# Patient Record
Sex: Male | Born: 1945
Health system: Southern US, Community
[De-identification: ages and names within clinical notes are randomized; demographics above are authoritative.]

## PROBLEM LIST (undated history)

## (undated) DIAGNOSIS — J189 Pneumonia, unspecified organism: Secondary | ICD-10-CM

## (undated) DIAGNOSIS — B9689 Other specified bacterial agents as the cause of diseases classified elsewhere: Secondary | ICD-10-CM

## (undated) DIAGNOSIS — G4700 Insomnia, unspecified: Secondary | ICD-10-CM

## (undated) DIAGNOSIS — K5790 Diverticulosis of intestine, part unspecified, without perforation or abscess without bleeding: Secondary | ICD-10-CM

## (undated) DIAGNOSIS — B349 Viral infection, unspecified: Secondary | ICD-10-CM

## (undated) DIAGNOSIS — Z87891 Personal history of nicotine dependence: Secondary | ICD-10-CM

## (undated) DIAGNOSIS — C61 Malignant neoplasm of prostate: Secondary | ICD-10-CM

## (undated) DIAGNOSIS — K5792 Diverticulitis of intestine, part unspecified, without perforation or abscess without bleeding: Secondary | ICD-10-CM

## (undated) DIAGNOSIS — J019 Acute sinusitis, unspecified: Secondary | ICD-10-CM

## (undated) DIAGNOSIS — G4733 Obstructive sleep apnea (adult) (pediatric): Secondary | ICD-10-CM

## (undated) DIAGNOSIS — L570 Actinic keratosis: Secondary | ICD-10-CM

## (undated) DIAGNOSIS — R972 Elevated prostate specific antigen [PSA]: Secondary | ICD-10-CM

## (undated) HISTORY — PX: CATARACT EXTRACTION: SUR2

## (undated) HISTORY — PX: BACK SURGERY: SHX140

## (undated) HISTORY — DX: Diverticulosis of intestine, part unspecified, without perforation or abscess without bleeding: K57.90

## (undated) HISTORY — PX: COLONOSCOPY: SHX174

## (undated) HISTORY — PX: LEG SURGERY: SHX1003

## (undated) HISTORY — DX: Diverticulitis of intestine, part unspecified, without perforation or abscess without bleeding: K57.92

## (undated) HISTORY — DX: Elevated prostate specific antigen (PSA): R97.20

## (undated) HISTORY — DX: Viral infection, unspecified: B34.9

## (undated) HISTORY — DX: Actinic keratosis: L57.0

## (undated) HISTORY — DX: Acute sinusitis, unspecified: J01.90

## (undated) HISTORY — DX: Insomnia, unspecified: G47.00

## (undated) HISTORY — DX: Obstructive sleep apnea (adult) (pediatric): G47.33

## (undated) HISTORY — DX: Other specified bacterial agents as the cause of diseases classified elsewhere: B96.89

## (undated) HISTORY — DX: Personal history of nicotine dependence: Z87.891

## (undated) HISTORY — PX: APPENDECTOMY: SHX54

## (undated) HISTORY — DX: Pneumonia, unspecified organism: J18.9

---

## 2004-07-03 ENCOUNTER — Ambulatory Visit: Payer: Self-pay | Admitting: Internal Medicine

## 2009-07-26 DIAGNOSIS — Z87891 Personal history of nicotine dependence: Secondary | ICD-10-CM | POA: Insufficient documentation

## 2009-07-26 HISTORY — DX: Personal history of nicotine dependence: Z87.891

## 2010-05-14 ENCOUNTER — Ambulatory Visit: Payer: Self-pay | Admitting: Family Medicine

## 2011-01-01 ENCOUNTER — Ambulatory Visit: Payer: Self-pay | Admitting: Family Medicine

## 2011-02-06 ENCOUNTER — Ambulatory Visit: Payer: Self-pay | Admitting: Gastroenterology

## 2011-02-10 LAB — HM COLONOSCOPY

## 2011-05-28 DIAGNOSIS — B9789 Other viral agents as the cause of diseases classified elsewhere: Secondary | ICD-10-CM | POA: Diagnosis not present

## 2011-05-28 DIAGNOSIS — K5732 Diverticulitis of large intestine without perforation or abscess without bleeding: Secondary | ICD-10-CM | POA: Diagnosis not present

## 2011-06-04 ENCOUNTER — Ambulatory Visit: Payer: Self-pay | Admitting: Family Medicine

## 2011-06-04 DIAGNOSIS — R059 Cough, unspecified: Secondary | ICD-10-CM | POA: Diagnosis not present

## 2011-06-04 DIAGNOSIS — R05 Cough: Secondary | ICD-10-CM | POA: Diagnosis not present

## 2011-06-04 DIAGNOSIS — R0989 Other specified symptoms and signs involving the circulatory and respiratory systems: Secondary | ICD-10-CM | POA: Diagnosis not present

## 2011-06-04 DIAGNOSIS — R0609 Other forms of dyspnea: Secondary | ICD-10-CM | POA: Diagnosis not present

## 2011-06-04 DIAGNOSIS — R1011 Right upper quadrant pain: Secondary | ICD-10-CM | POA: Diagnosis not present

## 2011-06-04 DIAGNOSIS — J189 Pneumonia, unspecified organism: Secondary | ICD-10-CM | POA: Diagnosis not present

## 2011-12-08 DIAGNOSIS — H04129 Dry eye syndrome of unspecified lacrimal gland: Secondary | ICD-10-CM | POA: Diagnosis not present

## 2011-12-08 DIAGNOSIS — H2510 Age-related nuclear cataract, unspecified eye: Secondary | ICD-10-CM | POA: Diagnosis not present

## 2012-04-30 DIAGNOSIS — J111 Influenza due to unidentified influenza virus with other respiratory manifestations: Secondary | ICD-10-CM | POA: Diagnosis not present

## 2012-05-12 DIAGNOSIS — R1032 Left lower quadrant pain: Secondary | ICD-10-CM | POA: Diagnosis not present

## 2012-05-12 DIAGNOSIS — J019 Acute sinusitis, unspecified: Secondary | ICD-10-CM | POA: Diagnosis not present

## 2012-05-18 ENCOUNTER — Ambulatory Visit: Payer: Self-pay | Admitting: Family Medicine

## 2012-05-18 DIAGNOSIS — R51 Headache: Secondary | ICD-10-CM | POA: Diagnosis not present

## 2012-05-18 DIAGNOSIS — M5030 Other cervical disc degeneration, unspecified cervical region: Secondary | ICD-10-CM | POA: Diagnosis not present

## 2012-06-16 DIAGNOSIS — H2510 Age-related nuclear cataract, unspecified eye: Secondary | ICD-10-CM | POA: Diagnosis not present

## 2012-06-16 DIAGNOSIS — H532 Diplopia: Secondary | ICD-10-CM | POA: Diagnosis not present

## 2012-06-16 DIAGNOSIS — H04129 Dry eye syndrome of unspecified lacrimal gland: Secondary | ICD-10-CM | POA: Diagnosis not present

## 2013-01-03 DIAGNOSIS — M2390 Unspecified internal derangement of unspecified knee: Secondary | ICD-10-CM | POA: Diagnosis not present

## 2013-09-11 DIAGNOSIS — B079 Viral wart, unspecified: Secondary | ICD-10-CM | POA: Diagnosis not present

## 2013-09-11 DIAGNOSIS — L719 Rosacea, unspecified: Secondary | ICD-10-CM | POA: Diagnosis not present

## 2013-09-11 DIAGNOSIS — B070 Plantar wart: Secondary | ICD-10-CM | POA: Diagnosis not present

## 2013-09-28 DIAGNOSIS — IMO0002 Reserved for concepts with insufficient information to code with codable children: Secondary | ICD-10-CM | POA: Diagnosis not present

## 2013-10-06 DIAGNOSIS — R269 Unspecified abnormalities of gait and mobility: Secondary | ICD-10-CM | POA: Diagnosis not present

## 2013-10-06 DIAGNOSIS — M25559 Pain in unspecified hip: Secondary | ICD-10-CM | POA: Diagnosis not present

## 2013-10-06 DIAGNOSIS — IMO0002 Reserved for concepts with insufficient information to code with codable children: Secondary | ICD-10-CM | POA: Diagnosis not present

## 2013-10-13 DIAGNOSIS — M25559 Pain in unspecified hip: Secondary | ICD-10-CM | POA: Diagnosis not present

## 2013-10-13 DIAGNOSIS — IMO0002 Reserved for concepts with insufficient information to code with codable children: Secondary | ICD-10-CM | POA: Diagnosis not present

## 2013-10-13 DIAGNOSIS — R269 Unspecified abnormalities of gait and mobility: Secondary | ICD-10-CM | POA: Diagnosis not present

## 2013-10-18 DIAGNOSIS — R269 Unspecified abnormalities of gait and mobility: Secondary | ICD-10-CM | POA: Diagnosis not present

## 2013-10-18 DIAGNOSIS — M25559 Pain in unspecified hip: Secondary | ICD-10-CM | POA: Diagnosis not present

## 2013-10-18 DIAGNOSIS — IMO0002 Reserved for concepts with insufficient information to code with codable children: Secondary | ICD-10-CM | POA: Diagnosis not present

## 2013-11-07 DIAGNOSIS — M25559 Pain in unspecified hip: Secondary | ICD-10-CM | POA: Diagnosis not present

## 2013-11-07 DIAGNOSIS — R269 Unspecified abnormalities of gait and mobility: Secondary | ICD-10-CM | POA: Diagnosis not present

## 2013-11-07 DIAGNOSIS — IMO0002 Reserved for concepts with insufficient information to code with codable children: Secondary | ICD-10-CM | POA: Diagnosis not present

## 2013-11-17 DIAGNOSIS — R269 Unspecified abnormalities of gait and mobility: Secondary | ICD-10-CM | POA: Diagnosis not present

## 2013-11-17 DIAGNOSIS — M25559 Pain in unspecified hip: Secondary | ICD-10-CM | POA: Diagnosis not present

## 2013-11-17 DIAGNOSIS — IMO0002 Reserved for concepts with insufficient information to code with codable children: Secondary | ICD-10-CM | POA: Diagnosis not present

## 2013-11-21 DIAGNOSIS — M25559 Pain in unspecified hip: Secondary | ICD-10-CM | POA: Diagnosis not present

## 2013-11-21 DIAGNOSIS — IMO0002 Reserved for concepts with insufficient information to code with codable children: Secondary | ICD-10-CM | POA: Diagnosis not present

## 2013-11-29 DIAGNOSIS — R269 Unspecified abnormalities of gait and mobility: Secondary | ICD-10-CM | POA: Diagnosis not present

## 2013-11-29 DIAGNOSIS — M25559 Pain in unspecified hip: Secondary | ICD-10-CM | POA: Diagnosis not present

## 2013-11-29 DIAGNOSIS — IMO0002 Reserved for concepts with insufficient information to code with codable children: Secondary | ICD-10-CM | POA: Diagnosis not present

## 2013-12-05 DIAGNOSIS — IMO0002 Reserved for concepts with insufficient information to code with codable children: Secondary | ICD-10-CM | POA: Diagnosis not present

## 2013-12-05 DIAGNOSIS — M25559 Pain in unspecified hip: Secondary | ICD-10-CM | POA: Diagnosis not present

## 2013-12-05 DIAGNOSIS — R269 Unspecified abnormalities of gait and mobility: Secondary | ICD-10-CM | POA: Diagnosis not present

## 2014-03-05 DIAGNOSIS — J209 Acute bronchitis, unspecified: Secondary | ICD-10-CM | POA: Diagnosis not present

## 2014-03-11 DIAGNOSIS — J209 Acute bronchitis, unspecified: Secondary | ICD-10-CM | POA: Diagnosis not present

## 2014-03-11 DIAGNOSIS — F419 Anxiety disorder, unspecified: Secondary | ICD-10-CM | POA: Diagnosis not present

## 2014-04-10 DIAGNOSIS — G4700 Insomnia, unspecified: Secondary | ICD-10-CM | POA: Diagnosis not present

## 2014-04-10 DIAGNOSIS — Z1389 Encounter for screening for other disorder: Secondary | ICD-10-CM | POA: Diagnosis not present

## 2014-09-18 DIAGNOSIS — Z0000 Encounter for general adult medical examination without abnormal findings: Secondary | ICD-10-CM | POA: Diagnosis not present

## 2014-09-18 DIAGNOSIS — H5201 Hypermetropia, right eye: Secondary | ICD-10-CM | POA: Diagnosis not present

## 2014-09-18 DIAGNOSIS — H524 Presbyopia: Secondary | ICD-10-CM | POA: Diagnosis not present

## 2014-09-18 DIAGNOSIS — Z13220 Encounter for screening for lipoid disorders: Secondary | ICD-10-CM | POA: Diagnosis not present

## 2014-09-18 DIAGNOSIS — Z125 Encounter for screening for malignant neoplasm of prostate: Secondary | ICD-10-CM | POA: Diagnosis not present

## 2014-09-18 DIAGNOSIS — H2513 Age-related nuclear cataract, bilateral: Secondary | ICD-10-CM | POA: Diagnosis not present

## 2014-09-18 DIAGNOSIS — Z131 Encounter for screening for diabetes mellitus: Secondary | ICD-10-CM | POA: Diagnosis not present

## 2014-09-18 DIAGNOSIS — Z1389 Encounter for screening for other disorder: Secondary | ICD-10-CM | POA: Diagnosis not present

## 2014-09-18 DIAGNOSIS — Z23 Encounter for immunization: Secondary | ICD-10-CM | POA: Diagnosis not present

## 2014-10-23 ENCOUNTER — Ambulatory Visit: Payer: Self-pay

## 2014-11-14 DIAGNOSIS — L718 Other rosacea: Secondary | ICD-10-CM | POA: Diagnosis not present

## 2014-11-14 DIAGNOSIS — B079 Viral wart, unspecified: Secondary | ICD-10-CM | POA: Diagnosis not present

## 2014-12-25 ENCOUNTER — Ambulatory Visit: Payer: Self-pay

## 2014-12-25 DIAGNOSIS — S0501XA Injury of conjunctiva and corneal abrasion without foreign body, right eye, initial encounter: Secondary | ICD-10-CM | POA: Diagnosis not present

## 2015-01-18 ENCOUNTER — Ambulatory Visit: Payer: Self-pay

## 2015-01-23 ENCOUNTER — Encounter: Payer: Self-pay | Admitting: *Deleted

## 2015-01-23 ENCOUNTER — Other Ambulatory Visit: Payer: Self-pay | Admitting: *Deleted

## 2015-01-23 ENCOUNTER — Encounter: Payer: Self-pay | Admitting: Urology

## 2015-01-23 ENCOUNTER — Ambulatory Visit (INDEPENDENT_AMBULATORY_CARE_PROVIDER_SITE_OTHER): Payer: Medicare Other | Admitting: Urology

## 2015-01-23 VITALS — BP 130/79 | HR 65 | Resp 16 | Ht 75.0 in | Wt 218.1 lb

## 2015-01-23 DIAGNOSIS — R972 Elevated prostate specific antigen [PSA]: Secondary | ICD-10-CM

## 2015-01-23 DIAGNOSIS — C61 Malignant neoplasm of prostate: Secondary | ICD-10-CM | POA: Insufficient documentation

## 2015-01-23 DIAGNOSIS — J189 Pneumonia, unspecified organism: Secondary | ICD-10-CM

## 2015-01-23 DIAGNOSIS — G4733 Obstructive sleep apnea (adult) (pediatric): Secondary | ICD-10-CM

## 2015-01-23 DIAGNOSIS — B349 Viral infection, unspecified: Secondary | ICD-10-CM

## 2015-01-23 DIAGNOSIS — B9689 Other specified bacterial agents as the cause of diseases classified elsewhere: Secondary | ICD-10-CM

## 2015-01-23 DIAGNOSIS — J019 Acute sinusitis, unspecified: Secondary | ICD-10-CM | POA: Insufficient documentation

## 2015-01-23 DIAGNOSIS — K5792 Diverticulitis of intestine, part unspecified, without perforation or abscess without bleeding: Secondary | ICD-10-CM

## 2015-01-23 DIAGNOSIS — G4700 Insomnia, unspecified: Secondary | ICD-10-CM | POA: Insufficient documentation

## 2015-01-23 HISTORY — DX: Insomnia, unspecified: G47.00

## 2015-01-23 HISTORY — DX: Diverticulitis of intestine, part unspecified, without perforation or abscess without bleeding: K57.92

## 2015-01-23 HISTORY — DX: Obstructive sleep apnea (adult) (pediatric): G47.33

## 2015-01-23 HISTORY — DX: Pneumonia, unspecified organism: J18.9

## 2015-01-23 HISTORY — DX: Viral infection, unspecified: B34.9

## 2015-01-23 HISTORY — DX: Elevated prostate specific antigen (PSA): R97.20

## 2015-01-23 HISTORY — DX: Other specified bacterial agents as the cause of diseases classified elsewhere: B96.89

## 2015-01-23 NOTE — Progress Notes (Signed)
01/23/2015 2:27 PM   [name] 1945/06/05 [id number]  Referring provider: Carmon Ginsberg CC: Elevated PSA  HPI: The patient is a 69 year old male who presents for an elevated prostate-specific antigen (5.4).  He denies any other urinary issues at this time. He does have a history of kidney stones and was had a negative prostate biopsy in the past. He denies nocturia, frequency, urgency, hesitancy, and hematuria.   PMH: No past medical history on file.  Surgical History: No past surgical history on file.  Home Medications:    Medication List       This list is accurate as of: 01/23/15  2:27 PM.  Always use your most recent med list.               ALPRAZolam 1 MG tablet  Commonly known as:  XANAX  Take by mouth.     CHANTIX STARTING MONTH PAK 0.5 MG X 11 & 1 MG X 42 tablet  Generic drug:  varenicline  Take by mouth.     doxycycline 50 MG capsule  Commonly known as:  VIBRAMYCIN  TAKE 1 CAPSULE BY MOUTH DAILY WITH FOOD AND PLENTY OF FLUID     metroNIDAZOLE 0.75 % gel  Commonly known as:  METROGEL  APPLY ON THE SKIN DAILY        Allergies:  Allergies  Allergen Reactions  . Penicillins     Family History: No family history on file.  Social History:  has no tobacco, alcohol, and drug history on file.  ROS:                                        Physical Exam: There were no vitals taken for this visit.  Constitutional:  Alert and oriented, No acute distress. HEENT: [place] AT, moist mucus membranes.  Trachea midline, no masses. Cardiovascular: No clubbing, cyanosis, or edema. Respiratory: Normal respiratory effort, no increased work of breathing. GI: Abdomen is soft, nontender, nondistended, no abdominal masses GU: No CVA tenderness. Normal phallus and testicles equally bilaterally. No masses. DRE: 2+, smooth and no nodules. Skin: No rashes, bruises or suspicious lesions. Lymph: No cervical or inguinal  adenopathy. Neurologic: Grossly intact, no focal deficits, moving all 4 extremities. Psychiatric: Normal mood and affect.  Laboratory Data: No results found for: WBC, HGB, HCT, MCV, PLT  No results found for: CREATININE   No results found for: TESTOSTERONE  No results found for: HGBA1C  PSA: 5.4 (May 2016)  Urinalysis No results found for: COLORURINE, APPEARANCEUR, LABSPEC, PHURINE, GLUCOSEU, HGBUR, BILIRUBINUR, KETONESUR, PROTEINUR, UROBILINOGEN, NITRITE, LEUKOCYTESUR   Assessment & Plan:    1. Elevated PSA  I had a long conversation the patient regarding PSA testing, including the controversial nature of the guidelines depending on the source. He is aware that PSA testing may lead to over treatment of prostate cancer. We discussed his elevated PSA and the indication for biopsy according to AUA. He is overall very healthy male. He had a repeat PSA drawn today. He would like to follow-up the results of this and make a decision about his prostate biopsy and whether he would like to proceed with this returns. We will call him with the results.  -follow up PSA from today -call patient with results [phone])    Nickie Retort, [place] [address] [place], [place] [address] [phone]

## 2015-01-24 LAB — URINALYSIS, COMPLETE
BILIRUBIN UA: NEGATIVE
Glucose, UA: NEGATIVE
KETONES UA: NEGATIVE
Leukocytes, UA: NEGATIVE
NITRITE UA: NEGATIVE
Protein, UA: NEGATIVE
RBC UA: NEGATIVE
SPEC GRAV UA: 1.02 (ref 1.005–1.030)
Urobilinogen, Ur: 1 mg/dL (ref 0.2–1.0)
pH, UA: 7.5 (ref 5.0–7.5)

## 2015-01-24 LAB — MICROSCOPIC EXAMINATION
Bacteria, UA: NONE SEEN
RBC MICROSCOPIC, UA: NONE SEEN /HPF (ref 0–?)
WBC UA: NONE SEEN /HPF (ref 0–?)

## 2015-01-24 LAB — PSA: Prostate Specific Ag, Serum: 5.7 ng/mL — ABNORMAL HIGH (ref 0.0–4.0)

## 2015-01-24 NOTE — Progress Notes (Signed)
Spoke with patient on phone. Pt aware PSA now 5.7 from 5.4 in may.  He would like to proceed a prostate bx.

## 2015-01-25 ENCOUNTER — Telehealth: Payer: Self-pay

## 2015-01-25 NOTE — Telephone Encounter (Signed)
Spoke with pt in reference to prostate bx. Pt is going to come to the office today and get pre bx instructions. Levaquin will be given the day of appt and enema can be picked up from the pharmacy.

## 2015-01-25 NOTE — Telephone Encounter (Signed)
-----   Message from Nickie Retort, MD sent at 01/24/2015 12:52 PM EDT ----- [name] needs to be scheduled for prostate biopsy.  Please provide patient with preop levaquin and enema.  Thanks, BB

## 2015-02-14 DIAGNOSIS — L578 Other skin changes due to chronic exposure to nonionizing radiation: Secondary | ICD-10-CM | POA: Diagnosis not present

## 2015-02-14 DIAGNOSIS — L821 Other seborrheic keratosis: Secondary | ICD-10-CM | POA: Diagnosis not present

## 2015-02-14 DIAGNOSIS — R208 Other disturbances of skin sensation: Secondary | ICD-10-CM | POA: Diagnosis not present

## 2015-02-14 DIAGNOSIS — B079 Viral wart, unspecified: Secondary | ICD-10-CM | POA: Diagnosis not present

## 2015-02-14 DIAGNOSIS — L820 Inflamed seborrheic keratosis: Secondary | ICD-10-CM | POA: Diagnosis not present

## 2015-02-28 ENCOUNTER — Other Ambulatory Visit: Payer: Self-pay | Admitting: Urology

## 2015-02-28 ENCOUNTER — Ambulatory Visit (INDEPENDENT_AMBULATORY_CARE_PROVIDER_SITE_OTHER): Payer: Medicare Other | Admitting: Urology

## 2015-02-28 VITALS — BP 115/76 | HR 67 | Ht 75.0 in | Wt 215.3 lb

## 2015-02-28 DIAGNOSIS — C61 Malignant neoplasm of prostate: Secondary | ICD-10-CM | POA: Diagnosis not present

## 2015-02-28 DIAGNOSIS — R972 Elevated prostate specific antigen [PSA]: Secondary | ICD-10-CM

## 2015-02-28 DIAGNOSIS — N400 Enlarged prostate without lower urinary tract symptoms: Secondary | ICD-10-CM

## 2015-02-28 MED ORDER — LEVOFLOXACIN 500 MG PO TABS
500.0000 mg | ORAL_TABLET | Freq: Once | ORAL | Status: AC
  Administered 2015-02-28: 500 mg via ORAL

## 2015-02-28 MED ORDER — GENTAMICIN SULFATE 40 MG/ML IJ SOLN
80.0000 mg | Freq: Once | INTRAMUSCULAR | Status: AC
  Administered 2015-02-28: 80 mg via INTRAMUSCULAR

## 2015-02-28 NOTE — Progress Notes (Signed)
Prostate Biopsy Procedure   Informed consent was obtained after discussing risks/benefits of the procedure.  A time out was performed to ensure correct patient identity.  Pre-Procedure: - Last PSA Level:  5.7  - Gentamicin given prophylactically - Levaquin 500 mg administered PO -Transrectal Ultrasound performed revealing a  88 gm prostate -No significant hypoechoic or median lobe noted  Procedure: - Prostate block performed using 10 cc 1% lidocaine and biopsies taken from sextant areas, a total of 12 under ultrasound guidance.  Post-Procedure: - Patient tolerated the procedure well - He was counseled to seek immediate medical attention if experiences any severe pain, significant bleeding, or fevers - Return in one week to discuss biopsy results

## 2015-03-07 ENCOUNTER — Other Ambulatory Visit: Payer: Self-pay | Admitting: Family Medicine

## 2015-03-07 LAB — PATHOLOGY REPORT

## 2015-03-11 ENCOUNTER — Telehealth: Payer: Self-pay

## 2015-03-11 NOTE — Telephone Encounter (Signed)
Patient aware that RX for Alprazolam was called in to [facility].  Thanks,  -Joseline

## 2015-03-12 ENCOUNTER — Ambulatory Visit (INDEPENDENT_AMBULATORY_CARE_PROVIDER_SITE_OTHER): Payer: Medicare Other | Admitting: Urology

## 2015-03-12 VITALS — BP 131/72 | HR 70 | Ht 75.0 in | Wt 215.0 lb

## 2015-03-12 DIAGNOSIS — Z8546 Personal history of malignant neoplasm of prostate: Secondary | ICD-10-CM | POA: Diagnosis not present

## 2015-03-12 NOTE — Progress Notes (Signed)
03/12/2015 4:44 PM   [name] 03/23/1946 [id number]  Referring provider: Birdie Sons, MD [address] [place], [place] [address]  Chief Complaint  Patient presents with  . Prostate Cancer  . Results    HPI: T1c adenocarcinoma prostate PSA 5.7 prostate size 88 g. This is a new diagnosis for this patient. Discussed with patient 45 minutes as well as given patient a booklet 100 questions most frequently aspirin patient's prostate cancer. Recommendations are as below.    PMH: Past Medical History  Diagnosis Date  . Acute bacterial sinusitis 01/23/2015  . PNA (pneumonia) 01/23/2015  . Severe obstructive sleep apnea 01/23/2015  . Abnormal prostate specific antigen 01/23/2015  . Cannot sleep 01/23/2015  . Disease caused by virus 01/23/2015  . History of tobacco use 07/26/2009  . Diverticulosis     Surgical History: No past surgical history on file.  Home Medications:    Medication List       This list is accurate as of: 03/12/15  4:44 PM.  Always use your most recent med list.               ALPRAZolam 1 MG tablet  Commonly known as:  XANAX  TAKE 1/2 TO 1 TABLET BY MOUTH AT BEDTIME     CHANTIX STARTING MONTH PAK 0.5 MG X 11 & 1 MG X 42 tablet  Generic drug:  varenicline  Take by mouth.     doxycycline 50 MG capsule  Commonly known as:  VIBRAMYCIN  TAKE 1 CAPSULE BY MOUTH DAILY WITH FOOD AND PLENTY OF FLUID     metroNIDAZOLE 0.75 % gel  Commonly known as:  METROGEL  APPLY ON THE SKIN DAILY        Allergies:  Allergies  Allergen Reactions  . Penicillins     Family History: Family History  Problem Relation Age of Onset  . Prostate cancer Father     Social History:  reports that he has been smoking Cigarettes.  He has been smoking about 0.50 packs per day. He does not have any smokeless tobacco history on file. He reports that he drinks about 1.2 oz of alcohol per week. He reports that he does not use illicit  drugs.  ROS: UROLOGY Frequent Urination?: No Hard to postpone urination?: No Burning/pain with urination?: No Get up at night to urinate?: No Leakage of urine?: No Urine stream starts and stops?: No Trouble starting stream?: No Do you have to strain to urinate?: No Blood in urine?: No Urinary tract infection?: No Sexually transmitted disease?: No Injury to kidneys or bladder?: No Painful intercourse?: No Weak stream?: No Erection problems?: No Penile pain?: No  Gastrointestinal Nausea?: No Vomiting?: No Indigestion/heartburn?: No Diarrhea?: No Constipation?: No  Constitutional Fever: No Night sweats?: No Weight loss?: No Fatigue?: No  Skin Skin rash/lesions?: No Itching?: No  Eyes Blurred vision?: No Double vision?: No  Ears/Nose/Throat Sore throat?: No Sinus problems?: No  Hematologic/Lymphatic Swollen glands?: No Easy bruising?: No  Cardiovascular Leg swelling?: No Chest pain?: No  Respiratory Cough?: No Shortness of breath?: No  Endocrine Excessive thirst?: No  Musculoskeletal Back pain?: No Joint pain?: No  Neurological Headaches?: No Dizziness?: No  Psychologic Depression?: No Anxiety?: No  Physical Exam: BP 131/72 mmHg  Pulse 70  Ht 6\' 3"  (1.905 m)  Wt 215 lb (97.523 kg)  BMI 26.87 kg/m2  Constitutional:  Alert and oriented, No acute distress. HEENT: [place] AT, moist mucus membranes.  Trachea midline, no masses.  Cardiovascular: No clubbing, cyanosis, or edema. Respiratory: Normal respiratory effort, no increased work of breathing. GI: Abdomen is soft, nontender, nondistended, no abdominal masses GU: No CVA tenderness. Large prostate grade 3 over 4 rectally with more prostate intravesical. Skin: No rashes, bruises or suspicious lesions. Lymph: No cervical or inguinal adenopathy. Neurologic: Grossly intact, no focal deficits, moving all 4 extremities. Psychiatric: Normal mood and affect.  Laboratory Data: No results found for:  WBC, HGB, HCT, MCV, PLT  No results found for: CREATININE  Lab Results  Component Value Date   PSA 5.7* 01/23/2015    No results found for: TESTOSTERONE  No results found for: HGBA1C  Urinalysis    Component Value Date/Time   GLUCOSEU Negative 01/23/2015 1438   BILIRUBINUR Negative 01/23/2015 1438   NITRITE Negative 01/23/2015 1438   LEUKOCYTESUR Negative 01/23/2015 1438    Pertinent Imaging: None today  Assessment & Plan:  Adenocarcinoma prostate as below. 45 minutes are spent with the patient discussing the various treatment options with emphasis on either radical prostatectomy after cytoreduction and or radiation therapy after cytoreduction. Because he is under 45 years old I was very positive towards surgery as first approach. The presence of the lesions in the apex with perineural invasion would make iridium seed implants problematic as well as cryo-or HIFU problematic. Diabetes 88 g with also created a more difficult radiation situation. Reduction of tissue with radiation be difficult and 88 g prostate at the dosages would be necessary for a curative radiation effects. At this time by the patient has T1c adenocarcinoma prostate with the risk of metastasis to the lymph nodes and about 8%. Addressed this with the patient as well as issues of incontinence and impotence need for 2 procedures rather than 1 case of increasing PSA over the years. I emphasized that surgery would be a better alternative first rather than radiation first followed by surgery if radiation failed  1. History of prostate cancer patient has a grade 6 adenocarcinoma prostate in an 88 g prostate with a PSA of 5.7. All 3 biopsies are positive from the apex to on the right and one on the left. The 2 apical biopsies that were positive have on 50% involvement of the core with perineural invasion 1 single opposite side apical lesion is 4% of the biopsy without perineural invasion    No Follow-up on file.  Collier Flowers, [facility] [address], [place], [place] [address] [phone]

## 2015-03-13 ENCOUNTER — Other Ambulatory Visit: Payer: Self-pay | Admitting: Urology

## 2015-03-19 DIAGNOSIS — C61 Malignant neoplasm of prostate: Secondary | ICD-10-CM | POA: Diagnosis not present

## 2015-04-01 ENCOUNTER — Telehealth: Payer: Self-pay | Admitting: Urology

## 2015-04-01 NOTE — Telephone Encounter (Signed)
Just wanted to let you know that [name] cancelled his follow up appt with you for now until he gets his second opinion. He said he will reschd after he does this and makes a decision on what he wants to do.  Thanks,  Sharyn Lull

## 2015-04-08 DIAGNOSIS — Z6828 Body mass index (BMI) 28.0-28.9, adult: Secondary | ICD-10-CM | POA: Diagnosis not present

## 2015-04-08 DIAGNOSIS — C61 Malignant neoplasm of prostate: Secondary | ICD-10-CM | POA: Diagnosis not present

## 2015-04-09 ENCOUNTER — Encounter: Payer: Self-pay | Admitting: Family Medicine

## 2015-04-12 ENCOUNTER — Ambulatory Visit: Payer: Medicare Other | Admitting: Urology

## 2015-04-17 DIAGNOSIS — B078 Other viral warts: Secondary | ICD-10-CM | POA: Diagnosis not present

## 2015-04-17 DIAGNOSIS — L820 Inflamed seborrheic keratosis: Secondary | ICD-10-CM | POA: Diagnosis not present

## 2015-04-17 DIAGNOSIS — R208 Other disturbances of skin sensation: Secondary | ICD-10-CM | POA: Diagnosis not present

## 2015-05-09 DIAGNOSIS — H2513 Age-related nuclear cataract, bilateral: Secondary | ICD-10-CM | POA: Diagnosis not present

## 2015-06-03 NOTE — Progress Notes (Signed)
  Oncology Nurse Navigator Documentation  Navigator Location: [facility] (06/03/15 1500)             Patient Visit Type: Follow-up (06/03/15 1500)                              Time Spent with Patient: 30 (06/03/15 1500)   Per care everywhere, Mr [name] has received his second opinion at [facility] and has chosen active surveillance. They have scheduled and MRI pelvis for 2/17 with follow up

## 2015-06-17 DIAGNOSIS — H25812 Combined forms of age-related cataract, left eye: Secondary | ICD-10-CM | POA: Diagnosis not present

## 2015-06-17 DIAGNOSIS — H47322 Drusen of optic disc, left eye: Secondary | ICD-10-CM | POA: Diagnosis not present

## 2015-06-17 DIAGNOSIS — H25811 Combined forms of age-related cataract, right eye: Secondary | ICD-10-CM | POA: Diagnosis not present

## 2015-06-17 DIAGNOSIS — Z9889 Other specified postprocedural states: Secondary | ICD-10-CM | POA: Diagnosis not present

## 2015-06-19 DIAGNOSIS — B078 Other viral warts: Secondary | ICD-10-CM | POA: Diagnosis not present

## 2015-06-19 DIAGNOSIS — L820 Inflamed seborrheic keratosis: Secondary | ICD-10-CM | POA: Diagnosis not present

## 2015-06-19 DIAGNOSIS — R208 Other disturbances of skin sensation: Secondary | ICD-10-CM | POA: Diagnosis not present

## 2015-06-22 DIAGNOSIS — N400 Enlarged prostate without lower urinary tract symptoms: Secondary | ICD-10-CM | POA: Diagnosis not present

## 2015-06-22 DIAGNOSIS — C61 Malignant neoplasm of prostate: Secondary | ICD-10-CM | POA: Diagnosis not present

## 2015-07-01 DIAGNOSIS — S63659A Sprain of metacarpophalangeal joint of unspecified finger, initial encounter: Secondary | ICD-10-CM | POA: Insufficient documentation

## 2015-07-01 DIAGNOSIS — S63650A Sprain of metacarpophalangeal joint of right index finger, initial encounter: Secondary | ICD-10-CM | POA: Diagnosis not present

## 2015-07-04 DIAGNOSIS — H5202 Hypermetropia, left eye: Secondary | ICD-10-CM | POA: Diagnosis not present

## 2015-07-04 DIAGNOSIS — H25812 Combined forms of age-related cataract, left eye: Secondary | ICD-10-CM | POA: Diagnosis not present

## 2015-07-04 DIAGNOSIS — H2512 Age-related nuclear cataract, left eye: Secondary | ICD-10-CM | POA: Diagnosis not present

## 2015-07-08 ENCOUNTER — Other Ambulatory Visit: Payer: Self-pay | Admitting: Family Medicine

## 2015-07-15 ENCOUNTER — Other Ambulatory Visit: Payer: Self-pay | Admitting: Family Medicine

## 2015-07-15 NOTE — Telephone Encounter (Signed)
Pt needs refill ALPRAZolam Duanne Moron) 1 MG    Thanks Con Memos

## 2015-07-15 NOTE — Telephone Encounter (Signed)
Please check on this. It should have been called in on or about 2/27 with refills

## 2015-07-18 ENCOUNTER — Other Ambulatory Visit: Payer: Self-pay | Admitting: Family Medicine

## 2015-07-18 NOTE — Telephone Encounter (Signed)
Rx called into pharmacy script was never sent in. Amparo Bristol

## 2015-07-26 NOTE — Telephone Encounter (Signed)
Prescription has been called in.KW 

## 2015-07-26 NOTE — Telephone Encounter (Signed)
done

## 2015-08-22 DIAGNOSIS — B078 Other viral warts: Secondary | ICD-10-CM | POA: Diagnosis not present

## 2015-08-22 DIAGNOSIS — R208 Other disturbances of skin sensation: Secondary | ICD-10-CM | POA: Diagnosis not present

## 2015-11-06 DIAGNOSIS — B078 Other viral warts: Secondary | ICD-10-CM | POA: Diagnosis not present

## 2015-11-06 DIAGNOSIS — L820 Inflamed seborrheic keratosis: Secondary | ICD-10-CM | POA: Diagnosis not present

## 2015-11-06 DIAGNOSIS — Z79899 Other long term (current) drug therapy: Secondary | ICD-10-CM | POA: Diagnosis not present

## 2015-11-06 DIAGNOSIS — L719 Rosacea, unspecified: Secondary | ICD-10-CM | POA: Diagnosis not present

## 2015-11-08 DIAGNOSIS — K5790 Diverticulosis of intestine, part unspecified, without perforation or abscess without bleeding: Secondary | ICD-10-CM | POA: Diagnosis not present

## 2015-11-08 DIAGNOSIS — G4733 Obstructive sleep apnea (adult) (pediatric): Secondary | ICD-10-CM | POA: Diagnosis not present

## 2015-11-08 DIAGNOSIS — Z79899 Other long term (current) drug therapy: Secondary | ICD-10-CM | POA: Diagnosis not present

## 2015-11-08 DIAGNOSIS — F17210 Nicotine dependence, cigarettes, uncomplicated: Secondary | ICD-10-CM | POA: Diagnosis not present

## 2015-11-08 DIAGNOSIS — Z8042 Family history of malignant neoplasm of prostate: Secondary | ICD-10-CM | POA: Diagnosis not present

## 2015-11-08 DIAGNOSIS — Z880 Allergy status to penicillin: Secondary | ICD-10-CM | POA: Diagnosis not present

## 2015-11-08 DIAGNOSIS — C61 Malignant neoplasm of prostate: Secondary | ICD-10-CM | POA: Diagnosis not present

## 2015-11-08 DIAGNOSIS — Z6827 Body mass index (BMI) 27.0-27.9, adult: Secondary | ICD-10-CM | POA: Diagnosis not present

## 2015-11-22 DIAGNOSIS — H35372 Puckering of macula, left eye: Secondary | ICD-10-CM | POA: Diagnosis not present

## 2015-11-29 DIAGNOSIS — M5412 Radiculopathy, cervical region: Secondary | ICD-10-CM | POA: Insufficient documentation

## 2015-12-17 DIAGNOSIS — M545 Low back pain: Secondary | ICD-10-CM | POA: Diagnosis not present

## 2015-12-17 DIAGNOSIS — M542 Cervicalgia: Secondary | ICD-10-CM | POA: Diagnosis not present

## 2015-12-19 DIAGNOSIS — M542 Cervicalgia: Secondary | ICD-10-CM | POA: Diagnosis not present

## 2015-12-19 DIAGNOSIS — M545 Low back pain: Secondary | ICD-10-CM | POA: Diagnosis not present

## 2015-12-24 DIAGNOSIS — M542 Cervicalgia: Secondary | ICD-10-CM | POA: Diagnosis not present

## 2015-12-24 DIAGNOSIS — M545 Low back pain: Secondary | ICD-10-CM | POA: Diagnosis not present

## 2015-12-26 DIAGNOSIS — M542 Cervicalgia: Secondary | ICD-10-CM | POA: Diagnosis not present

## 2015-12-26 DIAGNOSIS — M545 Low back pain: Secondary | ICD-10-CM | POA: Diagnosis not present

## 2016-01-02 DIAGNOSIS — M545 Low back pain: Secondary | ICD-10-CM | POA: Diagnosis not present

## 2016-01-02 DIAGNOSIS — M542 Cervicalgia: Secondary | ICD-10-CM | POA: Diagnosis not present

## 2016-01-06 DIAGNOSIS — M542 Cervicalgia: Secondary | ICD-10-CM | POA: Diagnosis not present

## 2016-01-06 DIAGNOSIS — M545 Low back pain: Secondary | ICD-10-CM | POA: Diagnosis not present

## 2016-01-07 DIAGNOSIS — L718 Other rosacea: Secondary | ICD-10-CM | POA: Diagnosis not present

## 2016-01-07 DIAGNOSIS — L821 Other seborrheic keratosis: Secondary | ICD-10-CM | POA: Diagnosis not present

## 2016-01-07 DIAGNOSIS — L570 Actinic keratosis: Secondary | ICD-10-CM | POA: Diagnosis not present

## 2016-01-07 DIAGNOSIS — D485 Neoplasm of uncertain behavior of skin: Secondary | ICD-10-CM | POA: Diagnosis not present

## 2016-01-07 DIAGNOSIS — I788 Other diseases of capillaries: Secondary | ICD-10-CM | POA: Diagnosis not present

## 2016-01-07 DIAGNOSIS — B078 Other viral warts: Secondary | ICD-10-CM | POA: Diagnosis not present

## 2016-01-14 DIAGNOSIS — M545 Low back pain: Secondary | ICD-10-CM | POA: Diagnosis not present

## 2016-01-14 DIAGNOSIS — M542 Cervicalgia: Secondary | ICD-10-CM | POA: Diagnosis not present

## 2016-01-16 DIAGNOSIS — M545 Low back pain: Secondary | ICD-10-CM | POA: Diagnosis not present

## 2016-01-16 DIAGNOSIS — M542 Cervicalgia: Secondary | ICD-10-CM | POA: Diagnosis not present

## 2016-02-24 ENCOUNTER — Other Ambulatory Visit: Payer: Self-pay | Admitting: Family Medicine

## 2016-02-26 NOTE — Telephone Encounter (Signed)
Prescription was called into pharmacy. KW 

## 2016-03-02 ENCOUNTER — Other Ambulatory Visit: Payer: Self-pay | Admitting: Family Medicine

## 2016-03-05 DIAGNOSIS — M5417 Radiculopathy, lumbosacral region: Secondary | ICD-10-CM | POA: Diagnosis not present

## 2016-03-18 DIAGNOSIS — L578 Other skin changes due to chronic exposure to nonionizing radiation: Secondary | ICD-10-CM | POA: Diagnosis not present

## 2016-03-18 DIAGNOSIS — L570 Actinic keratosis: Secondary | ICD-10-CM | POA: Diagnosis not present

## 2016-03-18 DIAGNOSIS — B078 Other viral warts: Secondary | ICD-10-CM | POA: Diagnosis not present

## 2016-03-30 DIAGNOSIS — B078 Other viral warts: Secondary | ICD-10-CM | POA: Diagnosis not present

## 2016-03-30 DIAGNOSIS — L233 Allergic contact dermatitis due to drugs in contact with skin: Secondary | ICD-10-CM | POA: Diagnosis not present

## 2016-04-08 DIAGNOSIS — M4316 Spondylolisthesis, lumbar region: Secondary | ICD-10-CM | POA: Diagnosis not present

## 2016-04-08 DIAGNOSIS — M47816 Spondylosis without myelopathy or radiculopathy, lumbar region: Secondary | ICD-10-CM | POA: Diagnosis not present

## 2016-04-08 DIAGNOSIS — M5126 Other intervertebral disc displacement, lumbar region: Secondary | ICD-10-CM | POA: Diagnosis not present

## 2016-04-08 DIAGNOSIS — M5417 Radiculopathy, lumbosacral region: Secondary | ICD-10-CM | POA: Diagnosis not present

## 2016-04-08 DIAGNOSIS — M5136 Other intervertebral disc degeneration, lumbar region: Secondary | ICD-10-CM | POA: Diagnosis not present

## 2016-04-08 DIAGNOSIS — M48061 Spinal stenosis, lumbar region without neurogenic claudication: Secondary | ICD-10-CM | POA: Diagnosis not present

## 2016-04-10 DIAGNOSIS — M5417 Radiculopathy, lumbosacral region: Secondary | ICD-10-CM | POA: Diagnosis not present

## 2016-04-29 DIAGNOSIS — B078 Other viral warts: Secondary | ICD-10-CM | POA: Diagnosis not present

## 2016-05-20 DIAGNOSIS — M5416 Radiculopathy, lumbar region: Secondary | ICD-10-CM | POA: Diagnosis not present

## 2016-05-22 DIAGNOSIS — Z6827 Body mass index (BMI) 27.0-27.9, adult: Secondary | ICD-10-CM | POA: Diagnosis not present

## 2016-05-22 DIAGNOSIS — M545 Low back pain: Secondary | ICD-10-CM | POA: Diagnosis not present

## 2016-05-22 DIAGNOSIS — G4733 Obstructive sleep apnea (adult) (pediatric): Secondary | ICD-10-CM | POA: Diagnosis not present

## 2016-05-22 DIAGNOSIS — C61 Malignant neoplasm of prostate: Secondary | ICD-10-CM | POA: Diagnosis not present

## 2016-05-22 DIAGNOSIS — G8929 Other chronic pain: Secondary | ICD-10-CM | POA: Diagnosis not present

## 2016-06-05 DIAGNOSIS — Z23 Encounter for immunization: Secondary | ICD-10-CM | POA: Diagnosis not present

## 2016-06-18 DIAGNOSIS — B078 Other viral warts: Secondary | ICD-10-CM | POA: Diagnosis not present

## 2016-06-18 DIAGNOSIS — L718 Other rosacea: Secondary | ICD-10-CM | POA: Diagnosis not present

## 2016-06-18 DIAGNOSIS — L570 Actinic keratosis: Secondary | ICD-10-CM | POA: Diagnosis not present

## 2016-07-03 DIAGNOSIS — C61 Malignant neoplasm of prostate: Secondary | ICD-10-CM | POA: Diagnosis not present

## 2016-07-27 DIAGNOSIS — L719 Rosacea, unspecified: Secondary | ICD-10-CM | POA: Diagnosis not present

## 2016-07-27 DIAGNOSIS — L570 Actinic keratosis: Secondary | ICD-10-CM | POA: Diagnosis not present

## 2016-07-27 DIAGNOSIS — L578 Other skin changes due to chronic exposure to nonionizing radiation: Secondary | ICD-10-CM | POA: Diagnosis not present

## 2016-07-27 DIAGNOSIS — B078 Other viral warts: Secondary | ICD-10-CM | POA: Diagnosis not present

## 2016-09-03 DIAGNOSIS — B078 Other viral warts: Secondary | ICD-10-CM | POA: Diagnosis not present

## 2016-09-03 DIAGNOSIS — R208 Other disturbances of skin sensation: Secondary | ICD-10-CM | POA: Diagnosis not present

## 2016-09-03 DIAGNOSIS — R234 Changes in skin texture: Secondary | ICD-10-CM | POA: Diagnosis not present

## 2016-09-25 ENCOUNTER — Other Ambulatory Visit: Payer: Self-pay | Admitting: Family Medicine

## 2016-09-25 NOTE — Telephone Encounter (Signed)
Prescription has been phoned into pharmacy. KW

## 2016-09-28 ENCOUNTER — Other Ambulatory Visit: Payer: Self-pay | Admitting: Family Medicine

## 2016-09-28 NOTE — Telephone Encounter (Signed)
Spoke with pharmacist and they received medication and filled it, it has not been picked up by patient. I called patient and advised him that script is ready for pick up. KW

## 2016-09-28 NOTE — Telephone Encounter (Signed)
Please check-this should have been called in on 5/18.

## 2016-11-25 DIAGNOSIS — H2511 Age-related nuclear cataract, right eye: Secondary | ICD-10-CM | POA: Diagnosis not present

## 2016-11-25 DIAGNOSIS — Z961 Presence of intraocular lens: Secondary | ICD-10-CM | POA: Diagnosis not present

## 2016-11-25 DIAGNOSIS — H02831 Dermatochalasis of right upper eyelid: Secondary | ICD-10-CM | POA: Diagnosis not present

## 2016-11-25 DIAGNOSIS — H02403 Unspecified ptosis of bilateral eyelids: Secondary | ICD-10-CM | POA: Diagnosis not present

## 2016-12-01 ENCOUNTER — Other Ambulatory Visit: Payer: Self-pay

## 2016-12-08 DIAGNOSIS — L720 Epidermal cyst: Secondary | ICD-10-CM | POA: Diagnosis not present

## 2016-12-08 DIAGNOSIS — B078 Other viral warts: Secondary | ICD-10-CM | POA: Diagnosis not present

## 2016-12-08 DIAGNOSIS — L719 Rosacea, unspecified: Secondary | ICD-10-CM | POA: Diagnosis not present

## 2016-12-08 DIAGNOSIS — M6798 Unspecified disorder of synovium and tendon, other site: Secondary | ICD-10-CM | POA: Diagnosis not present

## 2016-12-21 DIAGNOSIS — C61 Malignant neoplasm of prostate: Secondary | ICD-10-CM | POA: Diagnosis not present

## 2017-01-08 DIAGNOSIS — C61 Malignant neoplasm of prostate: Secondary | ICD-10-CM | POA: Diagnosis not present

## 2017-01-08 DIAGNOSIS — Z6827 Body mass index (BMI) 27.0-27.9, adult: Secondary | ICD-10-CM | POA: Diagnosis not present

## 2017-01-26 DIAGNOSIS — H00024 Hordeolum internum left upper eyelid: Secondary | ICD-10-CM | POA: Diagnosis not present

## 2017-02-09 ENCOUNTER — Other Ambulatory Visit: Payer: Self-pay | Admitting: Family Medicine

## 2017-02-09 NOTE — Telephone Encounter (Signed)
Bob's patient. Last OV before EPIC 09/18/2014 . Last RF 09/25/16. Please review.

## 2017-02-10 NOTE — Telephone Encounter (Signed)
RX called in at [facility] pharmacy  

## 2017-02-10 NOTE — Telephone Encounter (Signed)
He's not been seen in over two years, can call in 20 tablets, but he needs to schedule follow up with Mikki Santee.

## 2017-02-23 ENCOUNTER — Encounter: Payer: Self-pay | Admitting: Family Medicine

## 2017-02-23 ENCOUNTER — Ambulatory Visit (INDEPENDENT_AMBULATORY_CARE_PROVIDER_SITE_OTHER): Payer: Medicare Other | Admitting: Family Medicine

## 2017-02-23 VITALS — BP 124/54 | HR 62 | Temp 98.6°F | Resp 16 | Ht 75.0 in | Wt 211.0 lb

## 2017-02-23 DIAGNOSIS — Z6826 Body mass index (BMI) 26.0-26.9, adult: Secondary | ICD-10-CM

## 2017-02-23 DIAGNOSIS — E785 Hyperlipidemia, unspecified: Secondary | ICD-10-CM

## 2017-02-23 DIAGNOSIS — Z131 Encounter for screening for diabetes mellitus: Secondary | ICD-10-CM

## 2017-02-23 DIAGNOSIS — Z0000 Encounter for general adult medical examination without abnormal findings: Secondary | ICD-10-CM | POA: Diagnosis not present

## 2017-02-23 DIAGNOSIS — G4700 Insomnia, unspecified: Secondary | ICD-10-CM | POA: Diagnosis not present

## 2017-02-23 DIAGNOSIS — Z23 Encounter for immunization: Secondary | ICD-10-CM

## 2017-02-23 DIAGNOSIS — C61 Malignant neoplasm of prostate: Secondary | ICD-10-CM

## 2017-02-23 MED ORDER — VARENICLINE TARTRATE 1 MG PO TABS
1.0000 mg | ORAL_TABLET | Freq: Two times a day (BID) | ORAL | 4 refills | Status: AC
Start: 2017-02-23 — End: 2017-06-23

## 2017-02-23 MED ORDER — VARENICLINE TARTRATE 0.5 MG X 11 & 1 MG X 42 PO MISC: ORAL | 0 refills | Status: AC

## 2017-02-23 MED ORDER — ALPRAZOLAM 1 MG PO TABS: 0.5000 mg | ORAL_TABLET | Freq: Every day | ORAL | 5 refills | Status: DC

## 2017-02-23 NOTE — Patient Instructions (Addendum)
   The CDC recommends two doses of Shingrix (the shingles vaccine) separated by 2 to 6 months for adults age 72 years and older. I recommend checking with your pharmacy plan regarding coverage for this vaccine.    Screening for lung cancer is recommended for people between 30 and 21 years of age who have smoked the equivalent of 1 pack per day for 30 years. Please call our office at [phone] to schedule a low dose CT lung scan for lung cancer screening.

## 2017-02-23 NOTE — Progress Notes (Signed)
Patient: [name], Male    DOB: 21-May-1945, 71 y.o.   MRN: [id number] Visit Date: 02/23/2017  Today's Provider: Lelon Huh, MD   Chief Complaint  Patient presents with  . Annual Exam   Subjective:    Annual wellness visit [name] is a 71 y.o. male. He feels fairly well. He reports exercising none. He reports he is sleeping well. Continue to work full time.   -----------------------------------------------------------  Follow up insomnia. Continues to take alprazolam at bedtime which he finds effective. Usually only needs to take 1/2 tablet. No adverse effcct.   Follow up smoking cessation He states that Chantix has worked well in the past. He would like to try it again. He states he has been smoking 3/4 to 1 ppd since he was 71 years old.   He was also noted to have mildly elevated cholesterol when last checked in 2016.   Review of Systems  Constitutional: Negative for appetite change, chills, fatigue and fever.  HENT: Negative for congestion, ear pain, hearing loss, nosebleeds and trouble swallowing.   Eyes: Negative for pain and visual disturbance.  Respiratory: Negative for cough, chest tightness and shortness of breath.   Cardiovascular: Negative for chest pain, palpitations and leg swelling.  Gastrointestinal: Negative for abdominal pain, blood in stool, constipation, diarrhea, nausea and vomiting.  Endocrine: Negative for polydipsia, polyphagia and polyuria.  Genitourinary: Negative for dysuria and flank pain.  Musculoskeletal: Negative for arthralgias, back pain, joint swelling, myalgias and neck stiffness.  Skin: Negative for color change, rash and wound.  Neurological: Negative for dizziness, tremors, seizures, speech difficulty, weakness, light-headedness and headaches.  Psychiatric/Behavioral: Negative for behavioral problems, confusion, decreased concentration, dysphoric mood and sleep disturbance. The patient is not nervous/anxious.     All other systems reviewed and are negative.   Social History   Social History  . Marital status: Married    Spouse name: N/A  . Number of children: N/A  . Years of education: N/A   Occupational History  . Not on file.   Social History Main Topics  . Smoking status: Current Every Day Smoker    Packs/day: 0.50    Types: Cigarettes  . Smokeless tobacco: Never Used  . Alcohol use 1.2 oz/week    2 Glasses of wine per week  . Drug use: No  . Sexual activity: Not on file   Other Topics Concern  . Not on file   Social History Narrative  . No narrative on file    Past Medical History:  Diagnosis Date  . Abnormal prostate specific antigen 01/23/2015  . Acute bacterial sinusitis 01/23/2015  . Cannot sleep 01/23/2015  . Disease caused by virus 01/23/2015  . Diverticulitis 01/23/2015  . Diverticulosis   . History of tobacco use 07/26/2009  . PNA (pneumonia) 01/23/2015  . Severe obstructive sleep apnea 01/23/2015     Patient Active Problem List   Diagnosis Date Noted  . Cannot sleep 01/23/2015  . Prostate cancer ([place]) 01/23/2015  . Disease caused by virus 01/23/2015  . Severe obstructive sleep apnea 01/23/2015  . History of tobacco use 07/26/2009    History reviewed. No pertinent surgical history.  His family history includes Prostate cancer in his father.      Current Outpatient Prescriptions:  .  ALPRAZolam (XANAX) 1 MG tablet, TAKE 1/2 TO 1 TABLET BY MOUTH AT BEDTIME, Disp: 20 tablet, Rfl: 0 .  doxycycline (VIBRAMYCIN) 50 MG capsule, TAKE 1 CAPSULE BY MOUTH DAILY  WITH FOOD AND PLENTY OF FLUID, Disp: , Rfl: 11 .  metroNIDAZOLE (METROGEL) 0.75 % gel, APPLY ON THE SKIN DAILY, Disp: , Rfl: 0 .  varenicline (CHANTIX STARTING MONTH PAK) 0.5 MG X 11 & 1 MG X 42 tablet, Take by mouth., Disp: , Rfl:   Patient Care Team: Birdie Sons, MD as PCP - General (Family Medicine)     Objective:   Vitals: BP (!) 124/54 (BP Location: Right Arm, Patient Position: Sitting, Cuff  Size: Large)   Pulse 62   Temp 98.6 F (37 C) (Oral)   Resp 16   Ht 6\' 3"  (1.905 m)   Wt 211 lb (95.7 kg)   SpO2 96%   BMI 26.37 kg/m   Physical Exam   General Appearance:    Alert, cooperative, no distress  Eyes:    PERRL, conjunctiva/corneas clear, EOM's intact       Lungs:     Clear to auscultation bilaterally, respirations unlabored  Heart:    Regular rate and rhythm  Neurologic:   Awake, alert, oriented x 3. No apparent focal neurological           defect.        Activities of Daily Living In your present state of health, do you have any difficulty performing the following activities: 02/23/2017  Hearing? Y  Vision? Y  Difficulty concentrating or making decisions? N  Walking or climbing stairs? N  Dressing or bathing? N  Doing errands, shopping? N  Some recent data might be hidden    Fall Risk Assessment Fall Risk  02/23/2017  Falls in the past year? No     Depression Screen PHQ 2/9 Scores 02/23/2017  PHQ - 2 Score 0  PHQ- 9 Score 6    Cognitive Testing - 6-CIT PATIENT DECLINED  Audit-C Alcohol Use Screening  Question Answer Points  How often do you have alcoholic drink? 1 or more a day 3  On days you do drink alcohol, how many drinks do you typically consume? 1 to 2 1  How oftey will you drink 6 or more in a total? never 0  Total Score:  4   A score of 3 or more in women, and 4 or more in men indicates increased risk for alcohol abuse, EXCEPT if all of the points are from question 1.      Assessment & Plan:     Annual Wellness Visit  Reviewed patient's Family Medical History Reviewed and updated list of patient's medical providers Assessment of cognitive impairment was done Assessed patient's functional ability Established a written schedule for health screening [place] Completed and Reviewed  Exercise Activities and Dietary recommendations Goals    None      Immunization History  Administered Date(s)  Administered  . Pneumococcal Conjugate-13 09/18/2014    Health Maintenance  Topic Date Due  . Hepatitis C Screening  [birth year]  . TETANUS/TDAP  03/22/1964  . PNA vac Low Risk Adult (2 of 2 - PPSV23) 09/18/2015  . COLONOSCOPY  02/10/2016  . INFLUENZA VACCINE  12/09/2016     Discussed health benefits of physical activity, and encouraged him to engage in regular exercise appropriate for his age and condition.    -------------------------------------------------------------------------- 1. Medicare annual wellness visit, subsequent   2. BMI 26.0-26.9,adult Patient has mildly elevated BMI, but body habitus is relatively muscular and not consistent with obesity. Encouraged regular exercise and healthy eating habits.    3. Need for influenza vaccination  -  Flu vaccine HIGH DOSE PF  4. Need for pneumococcal vaccine  - Pneumococcal polysaccharide vaccine 23-valent greater than or equal to 2yo subcutaneous/IM  5. Prostate cancer [facility]) Continue regular follow up at [facility] urology.   6. Insomnia, unspecified type Doing well with hs alprazolam.   7. Screening for diabetes mellitus (DM)  - Glucose  8. Hyperlipidemia, unspecified hyperlipidemia type  - Lipid panel    Lelon Huh, MD  [facility]

## 2017-02-24 ENCOUNTER — Encounter: Payer: Self-pay | Admitting: Family Medicine

## 2017-02-24 DIAGNOSIS — M25559 Pain in unspecified hip: Secondary | ICD-10-CM | POA: Insufficient documentation

## 2017-02-24 DIAGNOSIS — E785 Hyperlipidemia, unspecified: Secondary | ICD-10-CM | POA: Diagnosis not present

## 2017-02-24 DIAGNOSIS — M5417 Radiculopathy, lumbosacral region: Secondary | ICD-10-CM | POA: Insufficient documentation

## 2017-02-24 DIAGNOSIS — M79606 Pain in leg, unspecified: Secondary | ICD-10-CM | POA: Insufficient documentation

## 2017-02-24 DIAGNOSIS — Z131 Encounter for screening for diabetes mellitus: Secondary | ICD-10-CM | POA: Diagnosis not present

## 2017-02-24 LAB — LIPID PANEL
CHOLESTEROL: 187 mg/dL (ref <200)
HDL: 67 mg/dL (ref >40)
LDL Cholesterol (Calc): 105 mg/dL (calc) — ABNORMAL HIGH
NON-HDL CHOLESTEROL (CALC): 120 mg/dL (ref <130)
TRIGLYCERIDES: 68 mg/dL (ref <150)
Total CHOL/HDL Ratio: 2.8 (calc) (ref <5.0)

## 2017-02-24 LAB — GLUCOSE, RANDOM: GLUCOSE: 91 mg/dL (ref 65–99)

## 2017-03-01 DIAGNOSIS — L820 Inflamed seborrheic keratosis: Secondary | ICD-10-CM | POA: Diagnosis not present

## 2017-03-01 DIAGNOSIS — L719 Rosacea, unspecified: Secondary | ICD-10-CM | POA: Diagnosis not present

## 2017-03-01 DIAGNOSIS — L720 Epidermal cyst: Secondary | ICD-10-CM | POA: Diagnosis not present

## 2017-03-01 DIAGNOSIS — L570 Actinic keratosis: Secondary | ICD-10-CM | POA: Diagnosis not present

## 2017-03-01 DIAGNOSIS — L578 Other skin changes due to chronic exposure to nonionizing radiation: Secondary | ICD-10-CM | POA: Diagnosis not present

## 2017-03-01 DIAGNOSIS — B078 Other viral warts: Secondary | ICD-10-CM | POA: Diagnosis not present

## 2017-08-27 ENCOUNTER — Other Ambulatory Visit: Payer: Self-pay | Admitting: Family Medicine

## 2017-08-27 DIAGNOSIS — G4700 Insomnia, unspecified: Secondary | ICD-10-CM

## 2017-09-20 DIAGNOSIS — M5416 Radiculopathy, lumbar region: Secondary | ICD-10-CM | POA: Diagnosis not present

## 2017-09-22 DIAGNOSIS — R05 Cough: Secondary | ICD-10-CM | POA: Diagnosis not present

## 2017-10-01 DIAGNOSIS — M5416 Radiculopathy, lumbar region: Secondary | ICD-10-CM | POA: Diagnosis not present

## 2017-10-07 DIAGNOSIS — M5126 Other intervertebral disc displacement, lumbar region: Secondary | ICD-10-CM | POA: Diagnosis not present

## 2017-10-07 DIAGNOSIS — R937 Abnormal findings on diagnostic imaging of other parts of musculoskeletal system: Secondary | ICD-10-CM | POA: Diagnosis not present

## 2017-10-07 DIAGNOSIS — M47816 Spondylosis without myelopathy or radiculopathy, lumbar region: Secondary | ICD-10-CM | POA: Diagnosis not present

## 2017-10-07 DIAGNOSIS — M5416 Radiculopathy, lumbar region: Secondary | ICD-10-CM | POA: Diagnosis not present

## 2017-10-08 DIAGNOSIS — M5416 Radiculopathy, lumbar region: Secondary | ICD-10-CM | POA: Insufficient documentation

## 2017-10-09 ENCOUNTER — Emergency Department
Admission: EM | Admit: 2017-10-09 | Discharge: 2017-10-09 | Disposition: A | Discharge status: Home / Self Care | Payer: Medicare Other | Attending: Emergency Medicine | Admitting: Emergency Medicine

## 2017-10-09 ENCOUNTER — Other Ambulatory Visit: Payer: Self-pay

## 2017-10-09 ENCOUNTER — Encounter: Payer: Self-pay | Admitting: Emergency Medicine

## 2017-10-09 ENCOUNTER — Emergency Department: Payer: Medicare Other

## 2017-10-09 DIAGNOSIS — F17210 Nicotine dependence, cigarettes, uncomplicated: Secondary | ICD-10-CM | POA: Diagnosis not present

## 2017-10-09 DIAGNOSIS — R079 Chest pain, unspecified: Secondary | ICD-10-CM | POA: Diagnosis not present

## 2017-10-09 DIAGNOSIS — R0789 Other chest pain: Secondary | ICD-10-CM | POA: Diagnosis not present

## 2017-10-09 LAB — CBC
HEMATOCRIT: 52.3 % — AB (ref 40.0–52.0)
Hemoglobin: 17.9 g/dL (ref 13.0–18.0)
MCH: 33.4 pg (ref 26.0–34.0)
MCHC: 34.2 g/dL (ref 32.0–36.0)
MCV: 97.6 fL (ref 80.0–100.0)
PLATELETS: 175 10*3/uL (ref 150–440)
RBC: 5.36 MIL/uL (ref 4.40–5.90)
RDW: 13 % (ref 11.5–14.5)
WBC: 10.8 10*3/uL — ABNORMAL HIGH (ref 3.8–10.6)

## 2017-10-09 LAB — BASIC METABOLIC PANEL
Anion gap: 10 (ref 5–15)
BUN: 16 mg/dL (ref 6–20)
CHLORIDE: 101 mmol/L (ref 101–111)
CO2: 25 mmol/L (ref 22–32)
CREATININE: 1.11 mg/dL (ref 0.61–1.24)
Calcium: 9.2 mg/dL (ref 8.9–10.3)
GFR calc Af Amer: >60 mL/min (ref >60)
GFR calc non Af Amer: >60 mL/min (ref >60)
GLUCOSE: 158 mg/dL — AB (ref 65–99)
POTASSIUM: 4.1 mmol/L (ref 3.5–5.1)
Sodium: 136 mmol/L (ref 135–145)

## 2017-10-09 LAB — TROPONIN I
Troponin I: <0.03 ng/mL (ref <0.03)
Troponin I: <0.03 ng/mL (ref <0.03)

## 2017-10-09 MED ORDER — DIAZEPAM 5 MG PO TABS
5.0000 mg | ORAL_TABLET | Freq: Once | ORAL | Status: AC
  Administered 2017-10-09: 5 mg via ORAL
  Filled 2017-10-09: qty 1

## 2017-10-09 MED ORDER — BISACODYL 10 MG RE SUPP
10.0000 mg | Freq: Once | RECTAL | Status: AC
  Administered 2017-10-09: 10 mg via RECTAL
  Filled 2017-10-09: qty 1

## 2017-10-09 NOTE — ED Notes (Signed)
Patient transported to X-ray 

## 2017-10-09 NOTE — ED Provider Notes (Signed)
[facility] Emergency Department Provider Note       Time seen: ----------------------------------------- 9:12 PM on 10/09/2017 -----------------------------------------   I have reviewed the triage vital signs and the nursing notes.  HISTORY   Chief Complaint Chest Pain    HPI [name] is a 72 y.o. male with a history of diverticulitis, pneumonia, sleep apnea, chronic low back pain who presents to the ED for chest pain that started this afternoon around 4 PM.  He describes it as pressure and constant and nonradiating.  He denies sweats, nausea shortness of breath.  He has no history of heart issues, does have a history of anxiety.  He took 2 full aspirins prior to arrival.  He has recently been treated for bronchitis.  Past Medical History:  Diagnosis Date  . Abnormal prostate specific antigen 01/23/2015  . Acute bacterial sinusitis 01/23/2015  . Cannot sleep 01/23/2015  . Disease caused by virus 01/23/2015  . Diverticulitis 01/23/2015  . Diverticulosis   . History of tobacco use 07/26/2009  . PNA (pneumonia) 01/23/2015  . Severe obstructive sleep apnea 01/23/2015    Patient Active Problem List   Diagnosis Date Noted  . Lumbosacral radiculitis 02/24/2017  . Hip pain 02/24/2017  . Cervical radiculitis 11/29/2015  . Sprain of metacarpophalangeal joint 07/01/2015  . Insomnia 01/23/2015  . Prostate cancer ([place]) 01/23/2015  . History of tobacco use 07/26/2009    Past Surgical History:  Procedure Laterality Date  . APPENDECTOMY    . LEG SURGERY Right     Allergies Penicillins  Social History Social History   Tobacco Use  . Smoking status: Current Every Day Smoker    Packs/day: 0.75    Years: 53.00    Pack years: 39.75    Types: Cigarettes  . Smokeless tobacco: Never Used  . Tobacco comment: Started smoking age 40, usually about 3/4 ppd  Substance Use Topics  . Alcohol use: Yes    Alcohol/week: 1.2 oz    Types: 2 Glasses of wine  per week  . Drug use: No   Review of Systems Constitutional: Negative for fever. Cardiovascular: Positive for chest pain Respiratory: Negative for shortness of breath. Gastrointestinal: Negative for abdominal pain, vomiting and diarrhea. Musculoskeletal: Positive for back pain Skin: Negative for rash. Neurological: Negative for headaches, focal weakness or numbness.  All systems negative/normal/unremarkable except as stated in the HPI  ____________________________________________   PHYSICAL EXAM:  VITAL SIGNS: ED Triage Vitals  Enc Vitals Group     BP 10/09/17 1925 (!) 166/84     Pulse Rate 10/09/17 1925 94     Resp 10/09/17 1925 19     Temp 10/09/17 1925 98.7 F (37.1 C)     Temp Source 10/09/17 1925 Oral     SpO2 10/09/17 1925 100 %     Weight 10/09/17 1926 210 lb (95.3 kg)     Height 10/09/17 1926 6\' 1"  (1.854 m)     Head Circumference --      Peak Flow --      Pain Score 10/09/17 1926 4     Pain Loc --      Pain Edu? --      Excl. in [place]? --    Constitutional: Alert and oriented. Well appearing and in no distress. Eyes: Conjunctivae are normal. Normal extraocular movements. ENT   Head: Normocephalic and atraumatic.   Nose: No congestion/rhinnorhea.   Mouth/Throat: Mucous membranes are moist.   Neck: No stridor. Cardiovascular: Normal rate, regular rhythm.  No murmurs, rubs, or gallops. Respiratory: Normal respiratory effort without tachypnea nor retractions. Breath sounds are clear and equal bilaterally. No wheezes/rales/rhonchi. Gastrointestinal: Soft and nontender. Normal bowel sounds Musculoskeletal: Nontender with normal range of motion in extremities. No lower extremity tenderness nor edema. Neurologic:  Normal speech and language. No gross focal neurologic deficits are appreciated.  Skin:  Skin is warm, dry and intact. No rash noted. Psychiatric: Mood and affect are normal. Speech and behavior are normal.   ____________________________________________  EKG: Interpreted by me.  Sinus rhythm the rate of 94 bpm, left axis deviation, right bundle branch block, normal QT.  ____________________________________________  ED COURSE:  As part of my medical decision making, I reviewed the following data within the [place] History obtained from family if available, nursing notes, old chart and ekg, as well as notes from prior ED visits. Patient presented for nonspecific chest pain, we will assess with labs and imaging as indicated at this time.   Procedures ____________________________________________   LABS (pertinent positives/negatives)  Labs Reviewed  BASIC METABOLIC PANEL - Abnormal; Notable for the following components:      Result Value   Glucose, Bld 158 (*)    All other components within normal limits  CBC - Abnormal; Notable for the following components:   WBC 10.8 (*)    HCT 52.3 (*)    All other components within normal limits  TROPONIN I  TROPONIN I    RADIOLOGY Images were viewed by me  Chest x-ray reveals IMPRESSION: 1.  No acute cardiopulmonary disease. 2. Possible BILATERAL lower lobe bronchiectasis. 3. Enlarged central pulmonary arteries indicating pulmonary arterial hypertension, unchanged since 2013. 4.  Emphysema (ICD10-J43.9). ____________________________________________  DIFFERENTIAL DIAGNOSIS   Unstable angina, MI, musculoskeletal pain, GERD, anxiety  FINAL ASSESSMENT AND PLAN  Chest pain   Plan: The patient had presented for nonspecific chest pain. Patient's labs were negative including repeat troponin. Patient's imaging did not reveal any acute process, he has no current symptoms of respiratory infection and these findings are likely chronic.  Repeat troponin is negative, I did offer hospital admission and he would prefer to go home and follow-up with a cardiologist as an outpatient.  I will refer him to Dr. Nehemiah Massed for close  outpatient follow-up.   Laurence Aly, MD   Note: This note was generated in part or whole with voice recognition software. Voice recognition is usually quite accurate but there are transcription errors that can and very often do occur. I apologize for any typographical errors that were not detected and corrected.     Earleen Newport, MD 10/09/17 2232

## 2017-10-09 NOTE — ED Triage Notes (Addendum)
Pt c/o left sided chest pain since this afternoon, pressure pain; constant; nonradiating; denies history of heart issues; shortness of breath "due to anxiety"; denies N/V; diaphoretic earlier but none now; pt took 2-81mg  baby aspirin pta

## 2017-10-09 NOTE — ED Notes (Signed)
Pt discharged to home.  Family member driving.  Discharge instructions reviewed.  Verbalized understanding.  No questions or concerns at this time.  Teach back verified.  Pt in NAD.  No items left in ED.   

## 2017-10-12 DIAGNOSIS — M5136 Other intervertebral disc degeneration, lumbar region: Secondary | ICD-10-CM | POA: Diagnosis not present

## 2017-10-12 DIAGNOSIS — M5416 Radiculopathy, lumbar region: Secondary | ICD-10-CM | POA: Diagnosis not present

## 2017-10-12 DIAGNOSIS — M48061 Spinal stenosis, lumbar region without neurogenic claudication: Secondary | ICD-10-CM | POA: Diagnosis not present

## 2017-10-13 DIAGNOSIS — R9431 Abnormal electrocardiogram [ECG] [EKG]: Secondary | ICD-10-CM | POA: Diagnosis not present

## 2017-10-13 DIAGNOSIS — E782 Mixed hyperlipidemia: Secondary | ICD-10-CM | POA: Diagnosis not present

## 2017-10-13 DIAGNOSIS — I208 Other forms of angina pectoris: Secondary | ICD-10-CM | POA: Diagnosis not present

## 2017-10-15 DIAGNOSIS — I208 Other forms of angina pectoris: Secondary | ICD-10-CM | POA: Diagnosis not present

## 2017-10-18 DIAGNOSIS — R9431 Abnormal electrocardiogram [ECG] [EKG]: Secondary | ICD-10-CM | POA: Diagnosis not present

## 2017-10-18 DIAGNOSIS — E782 Mixed hyperlipidemia: Secondary | ICD-10-CM | POA: Diagnosis not present

## 2017-10-18 DIAGNOSIS — R0602 Shortness of breath: Secondary | ICD-10-CM | POA: Insufficient documentation

## 2017-10-21 DIAGNOSIS — M48061 Spinal stenosis, lumbar region without neurogenic claudication: Secondary | ICD-10-CM | POA: Diagnosis not present

## 2017-10-21 DIAGNOSIS — M5136 Other intervertebral disc degeneration, lumbar region: Secondary | ICD-10-CM | POA: Diagnosis not present

## 2017-10-21 DIAGNOSIS — F419 Anxiety disorder, unspecified: Secondary | ICD-10-CM | POA: Diagnosis not present

## 2017-10-21 DIAGNOSIS — H579 Unspecified disorder of eye and adnexa: Secondary | ICD-10-CM | POA: Diagnosis not present

## 2017-10-21 DIAGNOSIS — M5126 Other intervertebral disc displacement, lumbar region: Secondary | ICD-10-CM | POA: Diagnosis not present

## 2017-10-21 DIAGNOSIS — F17210 Nicotine dependence, cigarettes, uncomplicated: Secondary | ICD-10-CM | POA: Diagnosis not present

## 2017-10-21 DIAGNOSIS — Z859 Personal history of malignant neoplasm, unspecified: Secondary | ICD-10-CM | POA: Diagnosis not present

## 2017-12-02 DIAGNOSIS — C61 Malignant neoplasm of prostate: Secondary | ICD-10-CM | POA: Diagnosis not present

## 2017-12-24 DIAGNOSIS — C61 Malignant neoplasm of prostate: Secondary | ICD-10-CM | POA: Diagnosis not present

## 2017-12-28 DIAGNOSIS — M545 Low back pain: Secondary | ICD-10-CM | POA: Diagnosis not present

## 2017-12-28 DIAGNOSIS — R2689 Other abnormalities of gait and mobility: Secondary | ICD-10-CM | POA: Diagnosis not present

## 2017-12-28 DIAGNOSIS — M6281 Muscle weakness (generalized): Secondary | ICD-10-CM | POA: Diagnosis not present

## 2017-12-31 DIAGNOSIS — M545 Low back pain: Secondary | ICD-10-CM | POA: Diagnosis not present

## 2017-12-31 DIAGNOSIS — M6281 Muscle weakness (generalized): Secondary | ICD-10-CM | POA: Diagnosis not present

## 2017-12-31 DIAGNOSIS — R2689 Other abnormalities of gait and mobility: Secondary | ICD-10-CM | POA: Diagnosis not present

## 2018-01-04 DIAGNOSIS — M6281 Muscle weakness (generalized): Secondary | ICD-10-CM | POA: Diagnosis not present

## 2018-01-04 DIAGNOSIS — M545 Low back pain: Secondary | ICD-10-CM | POA: Diagnosis not present

## 2018-01-04 DIAGNOSIS — R2689 Other abnormalities of gait and mobility: Secondary | ICD-10-CM | POA: Diagnosis not present

## 2018-01-11 DIAGNOSIS — M545 Low back pain: Secondary | ICD-10-CM | POA: Diagnosis not present

## 2018-01-11 DIAGNOSIS — M6281 Muscle weakness (generalized): Secondary | ICD-10-CM | POA: Diagnosis not present

## 2018-01-11 DIAGNOSIS — R2689 Other abnormalities of gait and mobility: Secondary | ICD-10-CM | POA: Diagnosis not present

## 2018-01-13 DIAGNOSIS — R2689 Other abnormalities of gait and mobility: Secondary | ICD-10-CM | POA: Diagnosis not present

## 2018-01-13 DIAGNOSIS — M6281 Muscle weakness (generalized): Secondary | ICD-10-CM | POA: Diagnosis not present

## 2018-01-13 DIAGNOSIS — M545 Low back pain: Secondary | ICD-10-CM | POA: Diagnosis not present

## 2018-01-18 DIAGNOSIS — M545 Low back pain: Secondary | ICD-10-CM | POA: Diagnosis not present

## 2018-01-18 DIAGNOSIS — R2689 Other abnormalities of gait and mobility: Secondary | ICD-10-CM | POA: Diagnosis not present

## 2018-01-18 DIAGNOSIS — M6281 Muscle weakness (generalized): Secondary | ICD-10-CM | POA: Diagnosis not present

## 2018-01-20 DIAGNOSIS — R2689 Other abnormalities of gait and mobility: Secondary | ICD-10-CM | POA: Diagnosis not present

## 2018-01-20 DIAGNOSIS — M6281 Muscle weakness (generalized): Secondary | ICD-10-CM | POA: Diagnosis not present

## 2018-01-20 DIAGNOSIS — M545 Low back pain: Secondary | ICD-10-CM | POA: Diagnosis not present

## 2018-01-25 DIAGNOSIS — M6281 Muscle weakness (generalized): Secondary | ICD-10-CM | POA: Diagnosis not present

## 2018-01-25 DIAGNOSIS — R2689 Other abnormalities of gait and mobility: Secondary | ICD-10-CM | POA: Diagnosis not present

## 2018-01-25 DIAGNOSIS — M545 Low back pain: Secondary | ICD-10-CM | POA: Diagnosis not present

## 2018-01-27 DIAGNOSIS — M6281 Muscle weakness (generalized): Secondary | ICD-10-CM | POA: Diagnosis not present

## 2018-01-27 DIAGNOSIS — R2689 Other abnormalities of gait and mobility: Secondary | ICD-10-CM | POA: Diagnosis not present

## 2018-01-27 DIAGNOSIS — M545 Low back pain: Secondary | ICD-10-CM | POA: Diagnosis not present

## 2018-02-01 DIAGNOSIS — R2689 Other abnormalities of gait and mobility: Secondary | ICD-10-CM | POA: Diagnosis not present

## 2018-02-01 DIAGNOSIS — M545 Low back pain: Secondary | ICD-10-CM | POA: Diagnosis not present

## 2018-02-01 DIAGNOSIS — M6281 Muscle weakness (generalized): Secondary | ICD-10-CM | POA: Diagnosis not present

## 2018-02-03 DIAGNOSIS — R2689 Other abnormalities of gait and mobility: Secondary | ICD-10-CM | POA: Diagnosis not present

## 2018-02-03 DIAGNOSIS — M545 Low back pain: Secondary | ICD-10-CM | POA: Diagnosis not present

## 2018-02-03 DIAGNOSIS — M6281 Muscle weakness (generalized): Secondary | ICD-10-CM | POA: Diagnosis not present

## 2018-02-08 DIAGNOSIS — M545 Low back pain: Secondary | ICD-10-CM | POA: Diagnosis not present

## 2018-02-08 DIAGNOSIS — M6281 Muscle weakness (generalized): Secondary | ICD-10-CM | POA: Diagnosis not present

## 2018-02-08 DIAGNOSIS — R2689 Other abnormalities of gait and mobility: Secondary | ICD-10-CM | POA: Diagnosis not present

## 2018-02-10 DIAGNOSIS — M6281 Muscle weakness (generalized): Secondary | ICD-10-CM | POA: Diagnosis not present

## 2018-02-10 DIAGNOSIS — R2689 Other abnormalities of gait and mobility: Secondary | ICD-10-CM | POA: Diagnosis not present

## 2018-02-10 DIAGNOSIS — M545 Low back pain: Secondary | ICD-10-CM | POA: Diagnosis not present

## 2018-02-15 DIAGNOSIS — M545 Low back pain: Secondary | ICD-10-CM | POA: Diagnosis not present

## 2018-02-15 DIAGNOSIS — M6281 Muscle weakness (generalized): Secondary | ICD-10-CM | POA: Diagnosis not present

## 2018-02-15 DIAGNOSIS — R2689 Other abnormalities of gait and mobility: Secondary | ICD-10-CM | POA: Diagnosis not present

## 2018-02-17 DIAGNOSIS — M6281 Muscle weakness (generalized): Secondary | ICD-10-CM | POA: Diagnosis not present

## 2018-02-17 DIAGNOSIS — M545 Low back pain: Secondary | ICD-10-CM | POA: Diagnosis not present

## 2018-02-17 DIAGNOSIS — R2689 Other abnormalities of gait and mobility: Secondary | ICD-10-CM | POA: Diagnosis not present

## 2018-02-18 DIAGNOSIS — Z23 Encounter for immunization: Secondary | ICD-10-CM | POA: Diagnosis not present

## 2018-02-24 DIAGNOSIS — M545 Low back pain: Secondary | ICD-10-CM | POA: Diagnosis not present

## 2018-02-24 DIAGNOSIS — M6281 Muscle weakness (generalized): Secondary | ICD-10-CM | POA: Diagnosis not present

## 2018-02-24 DIAGNOSIS — R2689 Other abnormalities of gait and mobility: Secondary | ICD-10-CM | POA: Diagnosis not present

## 2018-03-01 DIAGNOSIS — M6281 Muscle weakness (generalized): Secondary | ICD-10-CM | POA: Diagnosis not present

## 2018-03-01 DIAGNOSIS — M545 Low back pain: Secondary | ICD-10-CM | POA: Diagnosis not present

## 2018-03-01 DIAGNOSIS — R2689 Other abnormalities of gait and mobility: Secondary | ICD-10-CM | POA: Diagnosis not present

## 2018-03-03 DIAGNOSIS — R2689 Other abnormalities of gait and mobility: Secondary | ICD-10-CM | POA: Diagnosis not present

## 2018-03-03 DIAGNOSIS — M545 Low back pain: Secondary | ICD-10-CM | POA: Diagnosis not present

## 2018-03-03 DIAGNOSIS — M6281 Muscle weakness (generalized): Secondary | ICD-10-CM | POA: Diagnosis not present

## 2018-03-06 ENCOUNTER — Other Ambulatory Visit: Payer: Self-pay | Admitting: Family Medicine

## 2018-03-06 DIAGNOSIS — G4700 Insomnia, unspecified: Secondary | ICD-10-CM

## 2018-03-07 ENCOUNTER — Encounter: Payer: Self-pay | Admitting: Family Medicine

## 2018-03-08 DIAGNOSIS — M545 Low back pain: Secondary | ICD-10-CM | POA: Diagnosis not present

## 2018-03-08 DIAGNOSIS — R2689 Other abnormalities of gait and mobility: Secondary | ICD-10-CM | POA: Diagnosis not present

## 2018-03-08 DIAGNOSIS — M6281 Muscle weakness (generalized): Secondary | ICD-10-CM | POA: Diagnosis not present

## 2018-03-10 DIAGNOSIS — M6281 Muscle weakness (generalized): Secondary | ICD-10-CM | POA: Diagnosis not present

## 2018-03-10 DIAGNOSIS — M545 Low back pain: Secondary | ICD-10-CM | POA: Diagnosis not present

## 2018-03-10 DIAGNOSIS — R2689 Other abnormalities of gait and mobility: Secondary | ICD-10-CM | POA: Diagnosis not present

## 2018-03-15 DIAGNOSIS — M6281 Muscle weakness (generalized): Secondary | ICD-10-CM | POA: Diagnosis not present

## 2018-03-15 DIAGNOSIS — R2689 Other abnormalities of gait and mobility: Secondary | ICD-10-CM | POA: Diagnosis not present

## 2018-03-15 DIAGNOSIS — M545 Low back pain: Secondary | ICD-10-CM | POA: Diagnosis not present

## 2018-03-16 DIAGNOSIS — Z961 Presence of intraocular lens: Secondary | ICD-10-CM | POA: Diagnosis not present

## 2018-03-16 DIAGNOSIS — H16223 Keratoconjunctivitis sicca, not specified as Sjogren's, bilateral: Secondary | ICD-10-CM | POA: Diagnosis not present

## 2018-03-16 DIAGNOSIS — H04123 Dry eye syndrome of bilateral lacrimal glands: Secondary | ICD-10-CM | POA: Diagnosis not present

## 2018-03-16 DIAGNOSIS — H2511 Age-related nuclear cataract, right eye: Secondary | ICD-10-CM | POA: Diagnosis not present

## 2018-03-18 DIAGNOSIS — R2689 Other abnormalities of gait and mobility: Secondary | ICD-10-CM | POA: Diagnosis not present

## 2018-03-18 DIAGNOSIS — M545 Low back pain: Secondary | ICD-10-CM | POA: Diagnosis not present

## 2018-03-18 DIAGNOSIS — M6281 Muscle weakness (generalized): Secondary | ICD-10-CM | POA: Diagnosis not present

## 2018-03-23 DIAGNOSIS — M545 Low back pain: Secondary | ICD-10-CM | POA: Diagnosis not present

## 2018-03-23 DIAGNOSIS — M6281 Muscle weakness (generalized): Secondary | ICD-10-CM | POA: Diagnosis not present

## 2018-03-23 DIAGNOSIS — R2689 Other abnormalities of gait and mobility: Secondary | ICD-10-CM | POA: Diagnosis not present

## 2018-03-29 ENCOUNTER — Telehealth: Payer: Self-pay | Admitting: Family Medicine

## 2018-03-29 DIAGNOSIS — M48061 Spinal stenosis, lumbar region without neurogenic claudication: Secondary | ICD-10-CM | POA: Diagnosis not present

## 2018-03-29 NOTE — Telephone Encounter (Signed)
Disregard the ENT referral.  Thanks, [place]

## 2018-03-29 NOTE — Telephone Encounter (Signed)
Spoke with pt.  He states [facility] called back saying he does not need a referral.  Please disregard this message.   Thanks,   -[name]

## 2018-03-29 NOTE — Telephone Encounter (Signed)
Pt wanting a referral to the [facility] ENT.  Pt was told he needs an appt to get the referral, however he wanted to ask if it can be done without an appt.since he's been going the [facility] for years.  Please advise pt.  Thanks, [employer]

## 2018-04-05 ENCOUNTER — Encounter: Payer: Self-pay | Admitting: Family Medicine

## 2018-04-05 ENCOUNTER — Ambulatory Visit (INDEPENDENT_AMBULATORY_CARE_PROVIDER_SITE_OTHER): Payer: Medicare Other | Admitting: Family Medicine

## 2018-04-05 ENCOUNTER — Other Ambulatory Visit: Payer: Self-pay

## 2018-04-05 VITALS — BP 160/74 | HR 69 | Temp 98.4°F | Ht 72.0 in | Wt 215.0 lb

## 2018-04-05 DIAGNOSIS — J40 Bronchitis, not specified as acute or chronic: Secondary | ICD-10-CM

## 2018-04-05 MED ORDER — AZITHROMYCIN 250 MG PO TABS: ORAL_TABLET | ORAL | 0 refills | Status: DC

## 2018-04-05 MED ORDER — ALBUTEROL SULFATE HFA 108 (90 BASE) MCG/ACT IN AERS: 2.0000 | INHALATION_SPRAY | Freq: Four times a day (QID) | RESPIRATORY_TRACT | 2 refills | Status: DC | PRN

## 2018-04-05 NOTE — Progress Notes (Signed)
Patient: [name] Male    DOB: 08/11/1945   72 y.o.   MRN: [id number] Visit Date: 04/05/2018  Today's Provider: Vernie Murders, PA   Chief Complaint  Patient presents with  . Cough    headaches, sinus pressure, drainage, SOB, fatigue   Subjective:     Pt presents with a cough, fatigue and watery eyes, stuffiness, dizziness, lightheaded, headache.  Pt reports this has been going on Starting last wed afternoon (03/30/18) with heavy diarrhea.  Pt reports he took benadryl and it helped some, then took Nyquil to help him rest at night.  Cough  This is a new problem. The current episode started in the past 7 days. The problem has been unchanged. The problem occurs constantly. The cough is productive of sputum. Associated symptoms include chills, a fever, headaches, nasal congestion, shortness of breath and wheezing. Pertinent negatives include no ear pain, eye redness, postnasal drip, rhinorrhea or sore throat. The treatment provided mild relief.      Past Medical History:  Diagnosis Date  . Abnormal prostate specific antigen 01/23/2015  . Acute bacterial sinusitis 01/23/2015  . Cannot sleep 01/23/2015  . Disease caused by virus 01/23/2015  . Diverticulitis 01/23/2015  . Diverticulosis   . History of tobacco use 07/26/2009  . PNA (pneumonia) 01/23/2015  . Severe obstructive sleep apnea 01/23/2015   Past Surgical History:  Procedure Laterality Date  . APPENDECTOMY    . LEG SURGERY Right    Family History  Problem Relation Age of Onset  . Prostate cancer Father   . Heart disease Father        MI in his 55s, but died at age 59   Allergies  Allergen Reactions  . Penicillins     Current Outpatient Medications:  .  ALPRAZolam (XANAX) 1 MG tablet, TAKE 1/2 TO 1 TABLET BY MOUTH AT BEDTIME, Disp: 30 tablet, Rfl: 3 .  HYDROcodone-acetaminophen (NORCO/VICODIN) 5-325 MG tablet, Take 1 tablet by mouth every 6 (six) hours as needed for moderate pain., Disp: , Rfl:   Review of  Systems  Constitutional: Positive for chills, fatigue and fever. Negative for activity change, appetite change, diaphoresis and unexpected weight change.  HENT: Positive for congestion, sinus pressure and sinus pain. Negative for dental problem, drooling, ear discharge, ear pain, facial swelling, hearing loss, mouth sores, nosebleeds, postnasal drip, rhinorrhea, sneezing, sore throat, tinnitus, trouble swallowing and voice change.   Eyes: Positive for discharge (eyes watering). Negative for photophobia, pain, redness, itching and visual disturbance.  Respiratory: Positive for cough, shortness of breath and wheezing. Negative for apnea, choking, chest tightness and stridor.   Cardiovascular: Negative.   Gastrointestinal: Negative.   Endocrine: Negative.   Genitourinary: Negative.   Musculoskeletal: Negative.   Skin: Negative.   Allergic/Immunologic: Negative.   Neurological: Positive for headaches. Negative for dizziness, tremors, seizures, syncope, facial asymmetry, speech difficulty, weakness, light-headedness and numbness.  Hematological: Negative.   Psychiatric/Behavioral: Negative.    Social History   Tobacco Use  . Smoking status: Current Every Day Smoker    Packs/day: 0.75    Years: 53.00    Pack years: 39.75    Types: Cigarettes  . Smokeless tobacco: Never Used  . Tobacco comment: Started smoking age 15, usually about 3/4 ppd  Substance Use Topics  . Alcohol use: Yes    Alcohol/week: 2.0 standard drinks    Types: 2 Glasses of wine per week   Objective:   BP (!) 160/74 (BP Location: Right  Arm, Patient Position: Sitting, Cuff Size: Normal)   Pulse 69   Temp 98.4 F (36.9 C) (Oral)   Ht 6' (1.829 m)   Wt 215 lb (97.5 kg)   SpO2 97%   BMI 29.16 kg/m  Vitals:   04/05/18 0926  BP: (!) 160/74  Pulse: 69  Temp: 98.4 F (36.9 C)  TempSrc: Oral  SpO2: 97%  Weight: 215 lb (97.5 kg)  Height: 6' (1.829 m)   Physical Exam  Constitutional: He is oriented to person, place,  and time. He appears well-developed and well-nourished. No distress.  HENT:  Head: Normocephalic and atraumatic.  Right Ear: Hearing normal.  Left Ear: Hearing normal.  Nose: Nose normal.  Eyes: Conjunctivae and lids are normal. Right eye exhibits no discharge. Left eye exhibits no discharge. No scleral icterus.  Neck: Neck supple.  Cardiovascular: Normal rate and regular rhythm.  Pulmonary/Chest: Effort normal and breath sounds normal. No respiratory distress.  Few crackles with coarse breath sounds.  Abdominal: Soft. Bowel sounds are normal.  Musculoskeletal: Normal range of motion.  Lymphadenopathy:    He has no cervical adenopathy.  Neurological: He is alert and oriented to person, place, and time.  Skin: Skin is intact. No lesion and no rash noted.  Psychiatric: He has a normal mood and affect. His speech is normal and behavior is normal. Thought content normal.      Assessment & Plan:     1. Bronchitis Onset with cough, congestion and sputum production over the past week. Has some temperature elevation to 101 over the past week, once. Has tried Benadryl and Nyquil for cough and congestion. History for tobacco use. Used his Ventolin-HFA once today and chest feels less tight with no wheezing now. Will treat with Z-pak and may add Mucinex-DM prn. Will start his prednisone taper that his back surgeon recommended and recheck if congestion no better in 5-7 days. - azithromycin (ZITHROMAX) 250 MG tablet; Take 2 tablets by mouth today then one daily for 4 days.  Dispense: 6 tablet; Refill: 0 - albuterol (PROVENTIL HFA;VENTOLIN HFA) 108 (90 Base) MCG/ACT inhaler; Inhale 2 puffs into the lungs every 6 (six) hours as needed for wheezing or shortness of breath.  Dispense: 1 Inhaler; Refill: [place], [place]  [facility]

## 2018-04-11 DIAGNOSIS — Z9889 Other specified postprocedural states: Secondary | ICD-10-CM | POA: Diagnosis not present

## 2018-04-11 DIAGNOSIS — M5126 Other intervertebral disc displacement, lumbar region: Secondary | ICD-10-CM | POA: Diagnosis not present

## 2018-04-15 DIAGNOSIS — M5416 Radiculopathy, lumbar region: Secondary | ICD-10-CM | POA: Diagnosis not present

## 2018-04-19 DIAGNOSIS — H018 Other specified inflammations of eyelid: Secondary | ICD-10-CM | POA: Diagnosis not present

## 2018-04-19 DIAGNOSIS — H04123 Dry eye syndrome of bilateral lacrimal glands: Secondary | ICD-10-CM | POA: Diagnosis not present

## 2018-04-21 DIAGNOSIS — M5416 Radiculopathy, lumbar region: Secondary | ICD-10-CM | POA: Diagnosis not present

## 2018-06-02 DIAGNOSIS — M5416 Radiculopathy, lumbar region: Secondary | ICD-10-CM | POA: Diagnosis not present

## 2018-07-23 ENCOUNTER — Other Ambulatory Visit: Payer: Self-pay | Admitting: Family Medicine

## 2018-07-23 DIAGNOSIS — G4700 Insomnia, unspecified: Secondary | ICD-10-CM

## 2018-08-25 ENCOUNTER — Other Ambulatory Visit: Payer: Self-pay | Admitting: Family Medicine

## 2018-08-25 DIAGNOSIS — J40 Bronchitis, not specified as acute or chronic: Secondary | ICD-10-CM

## 2018-09-03 ENCOUNTER — Other Ambulatory Visit: Payer: Self-pay | Admitting: Family Medicine

## 2018-09-03 DIAGNOSIS — G4700 Insomnia, unspecified: Secondary | ICD-10-CM

## 2018-10-12 ENCOUNTER — Other Ambulatory Visit: Payer: Self-pay | Admitting: Family Medicine

## 2018-10-12 DIAGNOSIS — G4700 Insomnia, unspecified: Secondary | ICD-10-CM

## 2018-10-20 DIAGNOSIS — C61 Malignant neoplasm of prostate: Secondary | ICD-10-CM | POA: Diagnosis not present

## 2018-10-28 DIAGNOSIS — C61 Malignant neoplasm of prostate: Secondary | ICD-10-CM | POA: Diagnosis not present

## 2018-10-28 DIAGNOSIS — Z6828 Body mass index (BMI) 28.0-28.9, adult: Secondary | ICD-10-CM | POA: Diagnosis not present

## 2018-12-07 ENCOUNTER — Ambulatory Visit (INDEPENDENT_AMBULATORY_CARE_PROVIDER_SITE_OTHER): Payer: Medicare Other | Admitting: Family Medicine

## 2018-12-07 ENCOUNTER — Other Ambulatory Visit: Payer: Self-pay

## 2018-12-07 DIAGNOSIS — G479 Sleep disorder, unspecified: Secondary | ICD-10-CM

## 2018-12-07 DIAGNOSIS — G4710 Hypersomnia, unspecified: Secondary | ICD-10-CM | POA: Diagnosis not present

## 2018-12-07 NOTE — Progress Notes (Signed)
Patient: [name] Male    DOB: [birth year]   73 y.o.   MRN: [id number] Visit Date: 12/07/2018  Today's Provider: Lelon Huh, MD   Chief Complaint  Patient presents with  . Insomnia   Subjective:     HPI  Virtual Visit via Video Note  I connected with [name] on 12/07/18 at  1:40 PM EDT by a video enabled telemedicine application and verified that I am speaking with the correct person using two identifiers.  Location: Patient: home Provider: [facility]   I discussed the limitations of evaluation and management by telemedicine and the availability of in person appointments. The patient expressed understanding and agreed to proceed.  History of Present Illness:  He reports 4 episodes over the last few years of waking up in the middle of night and seeing or hearing people or things that were not there, on one occasion even calling the police about suspected intruder when there was nothing there. Two of the episodes occurred in the last few weeks. One instance we was even thrashing his arms around, and later feared he may inadvertently strike his wife, so he has since been sleeping in another room. He has never had any similar episodes while awake during the day. He states he sleeps soundly but has been told he snores very loudly. He doesn't feel sleepy when active, but if he sits down to rest, read or book or watch television he can easily dose off.  He has been taking 1/2 tablet alprazolam most nights to help rest for years. However, he did not take the alprazolam before going to bed before the last two episodes.          Allergies  Allergen Reactions  . Penicillins      Current Outpatient Medications:  .  ALPRAZolam (XANAX) 1 MG tablet, TAKE 1/2-1 TABLET BY MOUTH AT BEDTIME, Disp: 30 tablet, Rfl: 3 .  VENTOLIN HFA 108 (90 Base) MCG/ACT inhaler, TAKE 2 PUFFS BY MOUTH EVERY 6 HOURS AS NEEDED FOR WHEEZE OR SHORTNESS OF BREATH, Disp:  18 Inhaler, Rfl: 2 .  doxycycline (VIBRA-TABS) 100 MG tablet, doxycycline hyclate 100 mg tablet, Disp: , Rfl:  .  HYDROcodone-acetaminophen (NORCO/VICODIN) 5-325 MG tablet, Take 1 tablet by mouth every 6 (six) hours as needed for moderate pain., Disp: , Rfl:   Review of Systems  Constitutional: Negative for appetite change, chills and fever.  Respiratory: Negative for chest tightness, shortness of breath and wheezing.   Cardiovascular: Negative for chest pain and palpitations.  Gastrointestinal: Negative for abdominal pain, nausea and vomiting.    Social History   Tobacco Use  . Smoking status: Current Every Day Smoker    Packs/day: 0.75    Years: 53.00    Pack years: 39.75    Types: Cigarettes  . Smokeless tobacco: Never Used  . Tobacco comment: Started smoking age 39, usually about 3/4 ppd  Substance Use Topics  . Alcohol use: Yes    Alcohol/week: 2.0 standard drinks    Types: 2 Glasses of wine per week      Objective:   There were no vitals taken for this visit.    Physical Exam  General appearance: alert, well developed, well nourished, cooperative and in no distress  Respiratory: Respirations even and unlabored, normal respiratory rate Psych: Appropriate mood and affect. Neurologic: Mental status: Alert, oriented to person, place, and time, thought content appropriate.       Assessment &  Plan     1. Sleep disturbance No sx of any other neurological disorders. Suspect sleep apnea interfering normal sleep cycles.   2. Hypersomnia  - Home sleep test   I discussed the assessment and treatment plan with the patient. The patient was provided an opportunity to ask questions and all were answered. The patient agreed with the plan and demonstrated an understanding of the instructions.   The patient was advised to call back or seek an in-person evaluation if the symptoms worsen or if the condition fails to improve as anticipated.  I provided 12 minutes of  non-face-to-face time during this encounter.    Lelon Huh, MD  [facility]

## 2018-12-08 NOTE — Patient Instructions (Signed)
.   Please review the attached list of medications and notify my office if there are any errors.   . Please bring all of your medications to every appointment so we can make sure that our medication list is the same as yours.   . We will have flu vaccines available after Labor Day. Please go to your pharmacy or call the office in early September to schedule you flu shot.   

## 2018-12-12 DIAGNOSIS — H018 Other specified inflammations of eyelid: Secondary | ICD-10-CM | POA: Diagnosis not present

## 2018-12-12 DIAGNOSIS — H04123 Dry eye syndrome of bilateral lacrimal glands: Secondary | ICD-10-CM | POA: Diagnosis not present

## 2018-12-20 DIAGNOSIS — G4733 Obstructive sleep apnea (adult) (pediatric): Secondary | ICD-10-CM | POA: Diagnosis not present

## 2018-12-20 DIAGNOSIS — R0602 Shortness of breath: Secondary | ICD-10-CM | POA: Diagnosis not present

## 2018-12-20 LAB — PULMONARY FUNCTION TEST

## 2018-12-21 DIAGNOSIS — G4733 Obstructive sleep apnea (adult) (pediatric): Secondary | ICD-10-CM | POA: Diagnosis not present

## 2018-12-21 DIAGNOSIS — R0602 Shortness of breath: Secondary | ICD-10-CM | POA: Diagnosis not present

## 2018-12-23 ENCOUNTER — Telehealth: Payer: Self-pay | Admitting: Family Medicine

## 2018-12-23 NOTE — Telephone Encounter (Signed)
Pt states he just finished the sleep study this week.  The machine was picked up yesterday, he says you should have the results by early next week.    Thanks,   -[name]

## 2018-12-23 NOTE — Telephone Encounter (Signed)
Patient had home sleep study ordered in July, but I still haven't got results. Please check with patient and see if he has had it done yet.

## 2018-12-29 ENCOUNTER — Telehealth: Payer: Self-pay | Admitting: Family Medicine

## 2018-12-29 DIAGNOSIS — G4733 Obstructive sleep apnea (adult) (pediatric): Secondary | ICD-10-CM

## 2018-12-29 NOTE — Telephone Encounter (Signed)
Pt called saying he had a sleep study about two weeks ago and wants to know if we have received the results back yet  CB#  [phone]  [place]

## 2018-12-30 DIAGNOSIS — G4733 Obstructive sleep apnea (adult) (pediatric): Secondary | ICD-10-CM | POA: Insufficient documentation

## 2018-12-30 NOTE — Telephone Encounter (Signed)
Pt advised.   Thanks,   -Cora Brierley  

## 2018-12-30 NOTE — Telephone Encounter (Signed)
Sleep study shows moderate sleep apnea. Recommend trial of CPAP. Have sent order. He should hear from respiratory therapy next week to set up equipment.  He'll need to schedule follow up 1 month after getting equipment. [place] for virtual visit if he prefers.

## 2018-12-31 NOTE — Telephone Encounter (Signed)
See separate message regarding results of sleep study

## 2019-01-02 NOTE — Telephone Encounter (Signed)
Pt really wanting to discuss more in detail the CPap machine. Pt wanting to talk with Dr. Caryn Section.  Please call pt back.  Thanks, [employer]

## 2019-01-18 ENCOUNTER — Telehealth: Payer: Self-pay | Admitting: Family Medicine

## 2019-01-18 NOTE — Telephone Encounter (Signed)
[id number] 

## 2019-01-18 NOTE — Telephone Encounter (Signed)
Spoke with patient on the phone and clarified with him message that was taken below. Patient states that he called [place] to let us know that he was contacted by company 3 weeks ago notifying him that they received order form you for CPAP machine and told patient they would contact him back in the next 5-7 business days to discuss CPAP order/delivery. Patient states that no one has follow back up with him and he is unsure who to contact. Patient states that he contacted company he had his sleep study done on and they did know which company patient should contact. Please advise if I need to contact Referral department? KW

## 2019-01-18 NOTE — Telephone Encounter (Signed)
Pt states he had a sleep study done and it showed moderate sleep apnea.  He received a call from a company that was trying to get a c- pap for him and he guess he was going to reach out to Dr. Caryn Section for an ok.  CB#  [phone]  teri

## 2019-01-18 NOTE — Telephone Encounter (Signed)
Would you like patient to arrange a o.v to further discuss? KW

## 2019-01-18 NOTE — Telephone Encounter (Signed)
Yes, he should go ahead and get CPAP

## 2019-02-03 DIAGNOSIS — C61 Malignant neoplasm of prostate: Secondary | ICD-10-CM | POA: Diagnosis not present

## 2019-02-17 DIAGNOSIS — C61 Malignant neoplasm of prostate: Secondary | ICD-10-CM | POA: Diagnosis not present

## 2019-02-18 ENCOUNTER — Other Ambulatory Visit: Payer: Self-pay | Admitting: Family Medicine

## 2019-02-18 DIAGNOSIS — G4700 Insomnia, unspecified: Secondary | ICD-10-CM

## 2019-03-06 DIAGNOSIS — Z23 Encounter for immunization: Secondary | ICD-10-CM | POA: Diagnosis not present

## 2019-03-13 DIAGNOSIS — M67432 Ganglion, left wrist: Secondary | ICD-10-CM | POA: Diagnosis not present

## 2019-03-13 DIAGNOSIS — B079 Viral wart, unspecified: Secondary | ICD-10-CM | POA: Diagnosis not present

## 2019-03-13 DIAGNOSIS — L719 Rosacea, unspecified: Secondary | ICD-10-CM | POA: Diagnosis not present

## 2019-03-13 DIAGNOSIS — D692 Other nonthrombocytopenic purpura: Secondary | ICD-10-CM | POA: Diagnosis not present

## 2019-03-16 DIAGNOSIS — M67432 Ganglion, left wrist: Secondary | ICD-10-CM | POA: Diagnosis not present

## 2019-07-06 DIAGNOSIS — M67432 Ganglion, left wrist: Secondary | ICD-10-CM | POA: Diagnosis not present

## 2019-07-07 ENCOUNTER — Other Ambulatory Visit: Payer: Self-pay | Admitting: Family Medicine

## 2019-07-07 DIAGNOSIS — G4700 Insomnia, unspecified: Secondary | ICD-10-CM

## 2019-07-07 NOTE — Telephone Encounter (Signed)
Last visit,12/07/18, no f/u OV, last filled on 02/18/19 # 30 x 3

## 2019-07-12 ENCOUNTER — Other Ambulatory Visit: Payer: Self-pay | Admitting: Family Medicine

## 2019-07-12 DIAGNOSIS — J40 Bronchitis, not specified as acute or chronic: Secondary | ICD-10-CM

## 2019-07-12 NOTE — Telephone Encounter (Signed)
Requested medication (s) are due for refill today: no  Requested medication (s) are on the active medication list: yes  Last refill: 06/12/2019  Future visit scheduled: no  Notes to clinic:  One inhaler should last at least one month. If the patient is requesting refills earlier, contact the patient to check for uncontrolled symptoms   Requested Prescriptions  Pending Prescriptions Disp Refills   VENTOLIN HFA 108 (90 Base) MCG/ACT inhaler [Pharmacy Med Name: VENTOLIN HFA 90 MCG INHALER]  2    Sig: TAKE 2 PUFFS BY MOUTH EVERY 6 HOURS AS NEEDED FOR WHEEZE OR SHORTNESS OF BREATH      Pulmonology:  Beta Agonists Failed - 07/12/2019  1:25 AM      Failed - One inhaler should last at least one month. If the patient is requesting refills earlier, contact the patient to check for uncontrolled symptoms.      Passed - Valid encounter within last 12 months    Recent Outpatient Visits           7 months ago Sleep disturbance   [facility] Birdie Sons, MD   1 year ago [place], [place]   2 years ago Medicare annual wellness visit, subsequent   [facility] Birdie Sons, MD

## 2019-08-02 DIAGNOSIS — C61 Malignant neoplasm of prostate: Secondary | ICD-10-CM | POA: Diagnosis not present

## 2019-08-10 DIAGNOSIS — R399 Unspecified symptoms and signs involving the genitourinary system: Secondary | ICD-10-CM | POA: Diagnosis not present

## 2019-08-10 DIAGNOSIS — C61 Malignant neoplasm of prostate: Secondary | ICD-10-CM | POA: Diagnosis not present

## 2019-10-05 ENCOUNTER — Other Ambulatory Visit: Payer: Self-pay | Admitting: Dermatology

## 2019-10-06 IMAGING — CR DG CHEST 2V
2 series · 2 of 2 positions shown · non-contrast
Comparison: 06/04/2011.

CLINICAL DATA: Acute onset chest pain. Personal history of prostate
cancer. Current smoker.

EXAM:
CHEST - 2 VIEW

[chest lat]
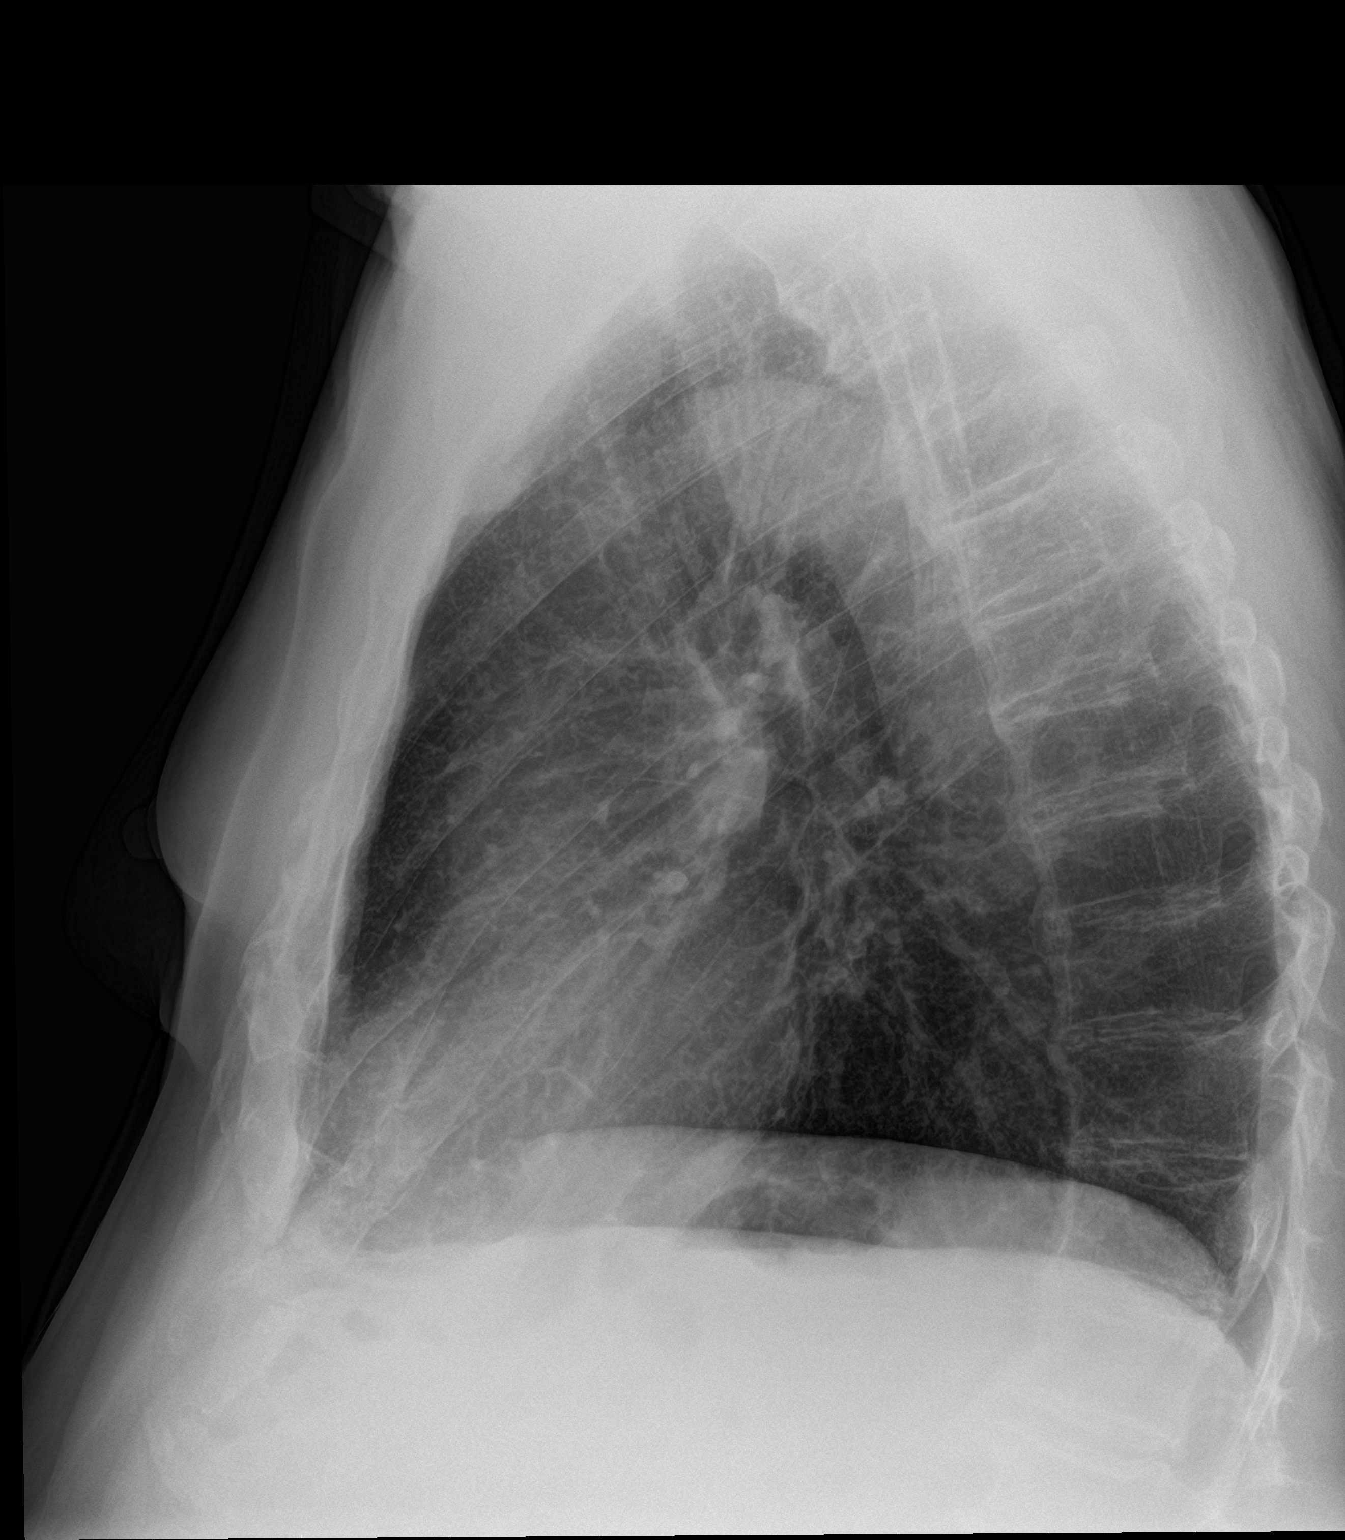

[chest pa]
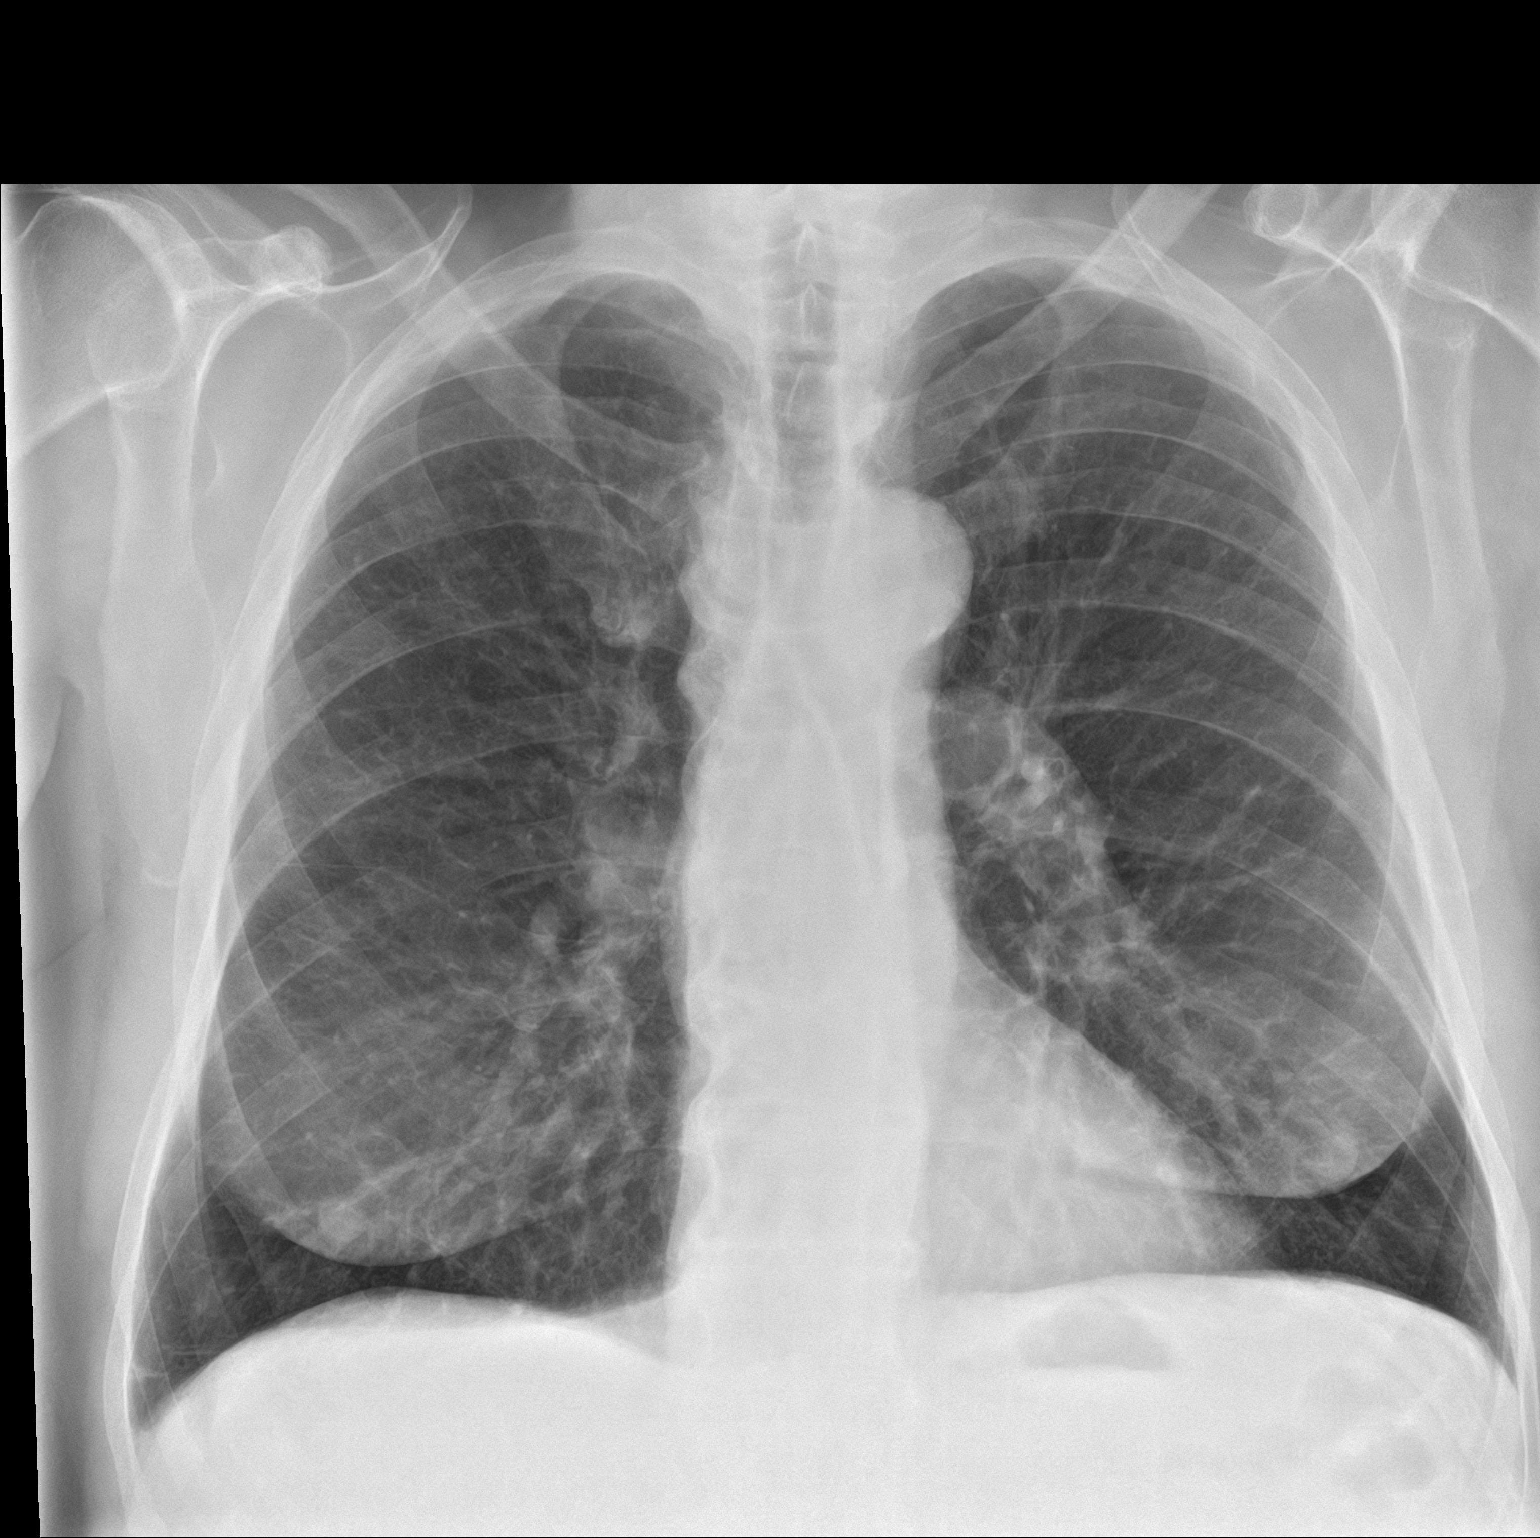

[2 of 2 positions shown; findings below may reference images not displayed]

FINDINGS: Cardiac silhouette normal in size, unchanged. Thoracic aorta mildly
tortuous and atherosclerotic, unchanged. Enlarged central pulmonary
arteries, unchanged. Emphysematous changes throughout both lungs
with scattered areas of linear scarring throughout both lungs,
unchanged. Mild central peribronchial thickening. Possible BILATERAL
lower lobe bronchiectasis. Lungs otherwise clear. No pleural
effusions. No pneumothorax. Degenerative changes and DISH involving
the thoracic spine.
IMPRESSION: 1.  No acute cardiopulmonary disease.
2. Possible BILATERAL lower lobe bronchiectasis.
3. Enlarged central pulmonary arteries indicating pulmonary arterial
hypertension, unchanged since [DATE].  Emphysema ([id number]).

## 2019-10-19 DIAGNOSIS — M48061 Spinal stenosis, lumbar region without neurogenic claudication: Secondary | ICD-10-CM | POA: Diagnosis not present

## 2019-10-26 DIAGNOSIS — G4733 Obstructive sleep apnea (adult) (pediatric): Secondary | ICD-10-CM | POA: Diagnosis not present

## 2019-10-26 DIAGNOSIS — R0602 Shortness of breath: Secondary | ICD-10-CM | POA: Diagnosis not present

## 2019-10-27 DIAGNOSIS — G4733 Obstructive sleep apnea (adult) (pediatric): Secondary | ICD-10-CM | POA: Diagnosis not present

## 2019-10-27 DIAGNOSIS — R0602 Shortness of breath: Secondary | ICD-10-CM | POA: Diagnosis not present

## 2019-11-02 DIAGNOSIS — M47815 Spondylosis without myelopathy or radiculopathy, thoracolumbar region: Secondary | ICD-10-CM | POA: Diagnosis not present

## 2019-11-02 DIAGNOSIS — M5126 Other intervertebral disc displacement, lumbar region: Secondary | ICD-10-CM | POA: Diagnosis not present

## 2019-11-02 DIAGNOSIS — M47816 Spondylosis without myelopathy or radiculopathy, lumbar region: Secondary | ICD-10-CM | POA: Diagnosis not present

## 2019-11-02 DIAGNOSIS — M47817 Spondylosis without myelopathy or radiculopathy, lumbosacral region: Secondary | ICD-10-CM | POA: Diagnosis not present

## 2019-11-02 DIAGNOSIS — M5125 Other intervertebral disc displacement, thoracolumbar region: Secondary | ICD-10-CM | POA: Diagnosis not present

## 2019-11-02 DIAGNOSIS — M5127 Other intervertebral disc displacement, lumbosacral region: Secondary | ICD-10-CM | POA: Diagnosis not present

## 2019-11-12 ENCOUNTER — Other Ambulatory Visit: Payer: Self-pay | Admitting: Family Medicine

## 2019-11-12 DIAGNOSIS — J40 Bronchitis, not specified as acute or chronic: Secondary | ICD-10-CM

## 2019-11-12 NOTE — Telephone Encounter (Signed)
Requested Prescriptions  Pending Prescriptions Disp Refills  . VENTOLIN HFA 108 (90 Base) MCG/ACT inhaler [Pharmacy Med Name: VENTOLIN HFA 90 MCG INHALER] 18 g 0    Sig: TAKE 2 PUFFS BY MOUTH EVERY 6 HOURS AS NEEDED FOR WHEEZE OR SHORTNESS OF BREATH     Pulmonology:  Beta Agonists Failed - 11/12/2019  9:16 AM      Failed - One inhaler should last at least one month. If the patient is requesting refills earlier, contact the patient to check for uncontrolled symptoms.      Passed - Valid encounter within last 12 months    Recent Outpatient Visits          11 months ago Sleep disturbance   [facility] Birdie Sons, MD   1 year ago [place], [place]   2 years ago [employer] annual wellness visit, subsequent   [facility] Birdie Sons, MD      Future Appointments            In 4 months Ralene Bathe, MD [place]

## 2019-11-16 DIAGNOSIS — M48061 Spinal stenosis, lumbar region without neurogenic claudication: Secondary | ICD-10-CM | POA: Diagnosis not present

## 2019-11-20 ENCOUNTER — Other Ambulatory Visit: Payer: Self-pay | Admitting: Family Medicine

## 2019-11-20 DIAGNOSIS — G4700 Insomnia, unspecified: Secondary | ICD-10-CM

## 2019-11-20 NOTE — Telephone Encounter (Signed)
Requested medication (s) are due for refill today: yes  Requested medication (s) are on the active medication list: yes  Last refill:  10/18/2019  Future visit scheduled:no  Notes to clinic:  this refill cannot be delegated    Requested Prescriptions  Pending Prescriptions Disp Refills   ALPRAZolam (XANAX) 1 MG tablet [Pharmacy Med Name: ALPRAZOLAM 1 MG TABLET] 30 tablet 3    Sig: TAKE 1/2 TO 1 TABLET BY MOUTH AT BEDTIME      Not Delegated - Psychiatry:  Anxiolytics/Hypnotics Failed - 11/20/2019 10:48 AM      Failed - This refill cannot be delegated      Failed - Urine Drug Screen completed in last 360 days.      Failed - Valid encounter within last 6 months    Recent Outpatient Visits           11 months ago Sleep disturbance   [facility] Birdie Sons, MD   1 year ago [place], Vickki Muff, [place]   2 years ago [employer] annual wellness visit, subsequent   [facility] Birdie Sons, MD       Future Appointments             In 3 months Ralene Bathe, MD [place]

## 2019-12-08 ENCOUNTER — Other Ambulatory Visit: Payer: Self-pay | Admitting: Family Medicine

## 2019-12-08 DIAGNOSIS — J40 Bronchitis, not specified as acute or chronic: Secondary | ICD-10-CM

## 2019-12-08 NOTE — Telephone Encounter (Signed)
Requested  medications are  due for refill today yes  Requested medications are on the active medication list yes  Last refill 7/4  Last visit 03/2018  Future visit scheduled NO  Notes to clinic Failed protocol of visit within 12 months and no visit scheduled.

## 2020-01-24 ENCOUNTER — Other Ambulatory Visit: Payer: Self-pay | Admitting: Family Medicine

## 2020-01-24 DIAGNOSIS — G4700 Insomnia, unspecified: Secondary | ICD-10-CM

## 2020-01-24 NOTE — Telephone Encounter (Signed)
Requested medication (s) are due for refill today: Yes  Requested medication (s) are on the active medication list: Yes  Last refill:  11/20/19  Future visit scheduled: No  Notes to clinic:  See request.    Requested Prescriptions  Pending Prescriptions Disp Refills   ALPRAZolam (XANAX) 1 MG tablet [Pharmacy Med Name: ALPRAZOLAM 1 MG TABLET] 30 tablet 1    Sig: TAKE 1/2-1 TABLET BY MOUTH AT BEDTIME      Not Delegated - Psychiatry:  Anxiolytics/Hypnotics Failed - 01/24/2020 10:59 AM      Failed - This refill cannot be delegated      Failed - Urine Drug Screen completed in last 360 days.      Failed - Valid encounter within last 6 months    Recent Outpatient Visits           1 year ago Sleep disturbance   [facility] Birdie Sons, MD   1 year ago [place], Vickki [place], [place]   2 years ago [employer] annual wellness visit, subsequent   [facility] Birdie Sons, MD       Future Appointments             In 1 month Nehemiah Massed Monia Sabal, MD [place]

## 2020-02-08 DIAGNOSIS — Z23 Encounter for immunization: Secondary | ICD-10-CM | POA: Diagnosis not present

## 2020-03-18 ENCOUNTER — Other Ambulatory Visit: Payer: Self-pay

## 2020-03-18 ENCOUNTER — Ambulatory Visit (INDEPENDENT_AMBULATORY_CARE_PROVIDER_SITE_OTHER): Payer: Medicare Other | Admitting: Dermatology

## 2020-03-18 DIAGNOSIS — L738 Other specified follicular disorders: Secondary | ICD-10-CM | POA: Diagnosis not present

## 2020-03-18 DIAGNOSIS — L719 Rosacea, unspecified: Secondary | ICD-10-CM

## 2020-03-18 DIAGNOSIS — I781 Nevus, non-neoplastic: Secondary | ICD-10-CM | POA: Diagnosis not present

## 2020-03-18 DIAGNOSIS — L820 Inflamed seborrheic keratosis: Secondary | ICD-10-CM | POA: Diagnosis not present

## 2020-03-18 DIAGNOSIS — L739 Follicular disorder, unspecified: Secondary | ICD-10-CM

## 2020-03-18 DIAGNOSIS — C44319 Basal cell carcinoma of skin of other parts of face: Secondary | ICD-10-CM | POA: Diagnosis not present

## 2020-03-18 DIAGNOSIS — L700 Acne vulgaris: Secondary | ICD-10-CM

## 2020-03-18 DIAGNOSIS — C4491 Basal cell carcinoma of skin, unspecified: Secondary | ICD-10-CM

## 2020-03-18 DIAGNOSIS — D485 Neoplasm of uncertain behavior of skin: Secondary | ICD-10-CM | POA: Diagnosis not present

## 2020-03-18 DIAGNOSIS — L578 Other skin changes due to chronic exposure to nonionizing radiation: Secondary | ICD-10-CM

## 2020-03-18 DIAGNOSIS — D492 Neoplasm of unspecified behavior of bone, soft tissue, and skin: Secondary | ICD-10-CM

## 2020-03-18 HISTORY — DX: Basal cell carcinoma of skin, unspecified: C44.91

## 2020-03-18 MED ORDER — DOXYCYCLINE HYCLATE 50 MG PO CAPS: 50.0000 mg | ORAL_CAPSULE | Freq: Every day | ORAL | 6 refills | Status: DC

## 2020-03-18 NOTE — Progress Notes (Signed)
   Follow-Up Visit   Subjective  [name] is a 74 y.o. male who presents for the following: Rosacea (Pt here for refill of Doxycyline 50 mg ). He also has several knots on the face that he feels are cysts.  He has one that is been growing on the right cheek for a few months.  The following portions of the chart were reviewed this encounter and updated as appropriate:  Tobacco  Allergies  Meds  Problems  Med Hx  Surg Hx  Fam Hx     Review of Systems:  No other skin or systemic complaints except as noted in HPI or Assessment and Plan.  Objective  Well appearing patient in no apparent distress; mood and affect are within normal limits.  A focused examination was performed including face, chest . Relevant physical exam findings are noted in the Assessment and Plan.  Objective  Head - Anterior (Face): Mid face erythema with telangiectasias +/- scattered inflammatory papules.   Objective  R cheek: 1.5 cm x 0.9 cm pearly papule      Objective  temples, zygoma: Comedones and cyst    Objective  upper lip: Blanchable papule   Images    Objective  Left Leg, neck, chest (20): Erythematous keratotic or waxy stuck-on papule or plaque.    Assessment & Plan  Rosacea -chronic, not to goal. Head - Anterior (Face) Cont Doxycycline 50 mg take 1 tablet daily with food Consider adding topical if he does not continue to improve.  Discussed laser treatment option -recommend he consider this in the next few months  Reordered Medications doxycycline (VIBRAMYCIN) 50 MG capsule  Neoplasm of skin -rule out basal cell carcinoma R cheek  Skin / nail biopsy Type of biopsy: tangential   Informed consent: discussed and consent obtained   Patient was prepped and draped in usual sterile fashion: area prepped with alochol. Anesthesia: the lesion was anesthetized in a standard fashion   Anesthetic:  1% lidocaine w/ epinephrine 1-100,000 buffered w/ 8.4% NaHCO3 Instrument  used: flexible razor blade   Hemostasis achieved with: pressure, aluminum chloride and electrodesiccation   Outcome: patient tolerated procedure well   Post-procedure details: wound care instructions given   Post-procedure details comment:  Ointment and small bandage  Specimen 1 - Surgical pathology Differential Diagnosis: R/O BCC Check Margins: No 1.5 cm x 0.9 cm pearly papule  Sebaceous hyperplasia with cyst and comedones and actinic changes/ [name] temples, zygoma [name], Sebaceous hyperplasia, cyst   This is a chronic persistent condition. Start otc Differin cream apply to face qhs   Telangiectasia upper lip Discussed laser treatment an option   Inflamed seborrheic keratosis (20) Left Leg, neck, chest  Destruction of lesion - Left Leg, neck, chest Complexity: simple   Destruction method: cryotherapy   Informed consent: discussed and consent obtained   Timeout:  patient name, date of birth, surgical site, and procedure verified Lesion destroyed using liquid nitrogen: Yes   Region frozen until ice ball extended beyond lesion: Yes   Outcome: patient tolerated procedure well with no complications   Post-procedure details: wound care instructions given    Return in about 1 year (around 03/18/2021), or if symptoms worsen or fail to improve.  IMarye Round, CMA, am acting as scribe for Sarina Ser, MD .  Documentation: I have reviewed the above documentation for accuracy and completeness, and I agree with the above.  Sarina Ser, MD

## 2020-03-18 NOTE — Patient Instructions (Signed)
Cryotherapy Aftercare  . Wash gently with soap and water everyday.   Marland Kitchen Apply Vaseline and Band-Aid daily until healed. Wound Care Instructions  On the day following your surgery, you should begin doing daily dressing changes: Remove the old dressing and discard it. Cleanse the wound gently with tap water. This may be done in the shower or by placing a wet gauze pad directly on the wound and letting it soak for several minutes. It is important to gently remove any dried blood from the wound in order to encourage healing. This may be done by gently rolling a moistened Q-tip on the dried blood. Do not pick at the wound. If the wound should start to bleed, continue cleaning the wound, then place a moist gauze pad on the wound and hold pressure for a few minutes.  Make sure you then dry the skin surrounding the wound completely or the tape will not stick to the skin. Do not use cotton balls on the wound. After the wound is clean and dry, apply the ointment gently with a Q-tip. Cut a non-stick pad to fit the size of the wound. Lay the pad flush to the wound. If the wound is draining, you may want to reinforce it with a small amount of gauze on top of the non-stick pad for a little added compression to the area. Use the tape to seal the area completely. Select from the following with respect to your individual situation: If your wound has been stitched closed: continue the above steps 1-8 at least daily until your sutures are removed. If your wound has been left open to heal: continue steps 1-8 at least daily for the first 3-4 weeks. We would like for you to take a few extra precautions for at least the next week. Sleep with your head elevated on pillows if our wound is on your head. Do not bend over or lift heavy items to reduce the chance of elevated blood pressure to the wound Do not participate in particularly strenuous activities.   Below is a list of dressing supplies you might need.   Cotton-tipped applicators - Q-tips Gauze pads (2x2 and/or 4x4) - All-Purpose Sponges Non-stick dressing material - Telfa Tape - Paper or Hypafix New and clean tube of petroleum jelly - Vaseline    Comments on Post-Operative Period Slight swelling and redness often appear around the wound. This is normal and will disappear within several days following the surgery. The healing wound will drain a brownish-red-yellow discharge during healing. This is a normal phase of wound healing. As the wound begins to heal, the drainage may increase in amount. Again, this drainage is normal. Notify us if the drainage becomes persistently bloody, excessively swollen, or intensely painful or develops a foul odor or red streaks.  If you should experience mild discomfort during the healing phase, you may take an aspirin-free medication such as Tylenol (acetaminophen). Notify us if the discomfort is severe or persistent. Avoid alcoholic beverages when taking pain medicine.  In Case of Wound Hemorrhage A wound hemorrhage is when the bandage suddenly becomes soaked with bright red blood and flows profusely. If this happens, sit down or lie down with your head elevated. If the wound has a dressing on it, do not remove the dressing. Apply pressure to the existing gauze. If the wound is not covered, use a gauze pad to apply pressure and continue applying the pressure for 20 minutes without peeking. DO NOT COVER THE WOUND WITH A LARGE TOWEL  OR [place] CLOTH. Release your hand from the wound site but do not remove the dressing. If the bleeding has stopped, gently clean around the wound. Leave the dressing in place for 24 hours if possible. This wait time allows the blood vessels to close off so that you do not spark a new round of bleeding by disrupting the newly clotted blood vessels with an immediate dressing change. If the bleeding does not subside, continue to hold pressure. If matters are out of your control, contact an After  Hours clinic or go to the Emergency Room.  .  . Start over the counter Differin Cream apply to face at night  Cont Doxycline 50 mg daily with food  Doxycycline should be taken with food to prevent nausea. Do not lay down for 30 minutes after taking. Be cautious with sun exposure and use good sun protection while on this medication. Pregnant women should not take this medication.

## 2020-03-19 ENCOUNTER — Encounter: Payer: Self-pay | Admitting: Dermatology

## 2020-03-21 ENCOUNTER — Telehealth: Payer: Self-pay

## 2020-03-21 NOTE — Telephone Encounter (Signed)
Left message on voicemail to return my call.  

## 2020-03-21 NOTE — Telephone Encounter (Signed)
-----   Message from Ralene Bathe, MD sent at 03/21/2020  9:51 AM EST ----- Diagnosis Skin , right cheek BASAL CELL CARCINOMA, NODULAR PATTERN  Cancer - BCC Schedule surgery

## 2020-03-25 NOTE — Telephone Encounter (Signed)
This well demarcated lesion should not need MOHS, but MOHS can be done. He may also see a Psychiatric nurse if he would like. May refer to MOHS if he prefers.

## 2020-03-25 NOTE — Telephone Encounter (Signed)
Patient has been advised of his biopsy results.   Patient does have a few questions of concern regarding the BX being on his cheek.  Patient questioned that he doesn't need MOHs since this is on his face? If he does the surgery in office with you can he go to a plastic surgeon after due to any scarring this might cause?

## 2020-03-25 NOTE — Telephone Encounter (Signed)
Patient advised of information per Dr. Nehemiah Massed.  He is still questioning several things.  1) how soon after would he see a Psychiatric nurse after doing surgery in office office with you? 2) who would you recommend at the plastic surgeon?

## 2020-03-25 NOTE — Telephone Encounter (Signed)
Patient advised of options above per Dr. Nehemiah Massed. He is going to think about his options and call back with what he wants to do.

## 2020-03-25 NOTE — Telephone Encounter (Signed)
Whoever does the procedure would cut it out and close it up. Options are: Dr Nehemiah Massed Mohs surgeon - Dr Lacinda Axon at [facility] (Dermatologist who specializes in [facility] surgery) Plastic Surgeon - Dr Claudia Desanctis in [place] (supposed to have office hours at [facility] on [address] near [facility] but do not know if he is set up there yet)  If he has more questions, he may make appt to discuss further.

## 2020-04-09 ENCOUNTER — Other Ambulatory Visit: Payer: Self-pay | Admitting: Family Medicine

## 2020-04-09 DIAGNOSIS — G4700 Insomnia, unspecified: Secondary | ICD-10-CM

## 2020-04-09 NOTE — Telephone Encounter (Signed)
Requested medication (s) are due for refill today: yes   Requested medication (s) are on the active medication list: yes   Last refill: 02/25/2020  Future visit scheduled: no  Notes to clinic: this refill cannot be delegated    Requested Prescriptions  Pending Prescriptions Disp Refills   ALPRAZolam (XANAX) 1 MG tablet [Pharmacy Med Name: ALPRAZOLAM 1 MG TABLET] 30 tablet 1    Sig: TAKE 1/2 TO 1 TABLET BY MOUTH AT BEDTIME      Not Delegated - Psychiatry:  Anxiolytics/Hypnotics Failed - 04/09/2020  8:56 AM      Failed - This refill cannot be delegated      Failed - Urine Drug Screen completed in last 360 days      Failed - Valid encounter within last 6 months    Recent Outpatient Visits           1 year ago Sleep disturbance   [facility] Birdie Sons, MD   2 years ago [place], [place]   3 years ago [employer] annual wellness visit, subsequent   [facility] Birdie Sons, MD

## 2020-04-11 DIAGNOSIS — Z23 Encounter for immunization: Secondary | ICD-10-CM | POA: Diagnosis not present

## 2020-04-17 ENCOUNTER — Other Ambulatory Visit: Payer: Self-pay

## 2020-04-17 DIAGNOSIS — L719 Rosacea, unspecified: Secondary | ICD-10-CM

## 2020-04-17 NOTE — Progress Notes (Signed)
ERROR

## 2020-05-17 ENCOUNTER — Other Ambulatory Visit: Payer: Self-pay | Admitting: Family Medicine

## 2020-05-17 DIAGNOSIS — G4700 Insomnia, unspecified: Secondary | ICD-10-CM

## 2020-05-17 NOTE — Telephone Encounter (Signed)
Requested medication (s) are due for refill today: no  Requested medication (s) are on the active medication list: yes  Last refill:  04/09/2020  Future visit scheduled: no  Notes to clinic: this refill cannot be delegated    Requested Prescriptions  Pending Prescriptions Disp Refills   ALPRAZolam (XANAX) 1 MG tablet [Pharmacy Med Name: ALPRAZOLAM 1 MG TABLET] 30 tablet 0    Sig: TAKE 1/2-1 TABLET BY MOUTH AT BEDTIME      Not Delegated - Psychiatry:  Anxiolytics/Hypnotics Failed - 05/17/2020 11:04 AM      Failed - This refill cannot be delegated      Failed - Urine Drug Screen completed in last 360 days      Failed - Valid encounter within last 6 months    Recent Outpatient Visits           1 year ago Sleep disturbance   [facility] Birdie Sons, MD   2 years ago [place], PA-C   3 years ago Medicare annual wellness visit, subsequent   [facility] Birdie Sons, MD

## 2020-05-21 DIAGNOSIS — Z125 Encounter for screening for malignant neoplasm of prostate: Secondary | ICD-10-CM | POA: Diagnosis not present

## 2020-06-04 ENCOUNTER — Ambulatory Visit (INDEPENDENT_AMBULATORY_CARE_PROVIDER_SITE_OTHER): Payer: Medicare Other | Admitting: Dermatology

## 2020-06-04 ENCOUNTER — Encounter: Payer: Self-pay | Admitting: Dermatology

## 2020-06-04 ENCOUNTER — Other Ambulatory Visit: Payer: Self-pay

## 2020-06-04 DIAGNOSIS — L578 Other skin changes due to chronic exposure to nonionizing radiation: Secondary | ICD-10-CM

## 2020-06-04 DIAGNOSIS — D492 Neoplasm of unspecified behavior of bone, soft tissue, and skin: Secondary | ICD-10-CM

## 2020-06-04 DIAGNOSIS — L988 Other specified disorders of the skin and subcutaneous tissue: Secondary | ICD-10-CM | POA: Diagnosis not present

## 2020-06-04 DIAGNOSIS — D485 Neoplasm of uncertain behavior of skin: Secondary | ICD-10-CM

## 2020-06-04 DIAGNOSIS — C44319 Basal cell carcinoma of skin of other parts of face: Secondary | ICD-10-CM | POA: Diagnosis not present

## 2020-06-04 DIAGNOSIS — L820 Inflamed seborrheic keratosis: Secondary | ICD-10-CM

## 2020-06-04 MED ORDER — MUPIROCIN 2 % EX OINT: 1.0000 "application " | TOPICAL_OINTMENT | Freq: Every day | CUTANEOUS | 0 refills | Status: DC

## 2020-06-04 NOTE — Progress Notes (Signed)
Follow-Up Visit   Subjective  [name] is a 75 y.o. male who presents for the following: Basal Cell Carcinoma (Biopsy proven of right cheek - [facility] today). He also has irritating lesions he would like checked and treated.  The following portions of the chart were reviewed this encounter and updated as appropriate:   Tobacco  Allergies  Meds  Problems  Med Hx  Surg Hx  Fam Hx     Review of Systems:  No other skin or systemic complaints except as noted in HPI or Assessment and Plan.  Objective  Well appearing patient in no apparent distress; mood and affect are within normal limits.  A focused examination was performed including face. Relevant physical exam findings are noted in the Assessment and Plan.  Objective  Right Cheek: Well healed biopsy site  Objective  R cheek mid base: Deep margin of BCC excision site   Assessment & Plan  Basal cell carcinoma (BCC) of skin of other part of face Right Cheek  Skin excision  Lesion length (cm):  0.9 Lesion width (cm):  2.1 Margin per side (cm):  0.2 Total excision diameter (cm):  2.5 Informed consent: discussed and consent obtained   Timeout: patient name, date of birth, surgical site, and procedure verified   Procedure prep:  Patient was prepped and draped in usual sterile fashion Prep type:  Isopropyl alcohol and povidone-iodine Anesthesia: the lesion was anesthetized in a standard fashion   Anesthetic:  1% lidocaine w/ epinephrine 1-100,000 buffered w/ 8.4% NaHCO3 Instrument used: #15 blade   Hemostasis achieved with: pressure   Hemostasis achieved with comment:  Electrocautery Outcome: patient tolerated procedure well with no complications   Post-procedure details: sterile dressing applied and wound care instructions given   Dressing type: bandage and pressure dressing (mupirocin)    Skin repair Complexity:  Complex Final length (cm):  3 Reason for type of repair: reduce tension to allow closure,  reduce the risk of dehiscence, infection, and necrosis, reduce subcutaneous dead space and avoid a hematoma, allow closure of the large defect, preserve normal anatomy, preserve normal anatomical and functional relationships and enhance both functionality and cosmetic results   Undermining: area extensively undermined   Undermining comment:  Undermining defect 1.3 Subcutaneous layers (deep stitches):  Suture size:  4-0 Suture type: Vicryl (polyglactin 910)   Subcutaneous suture technique: inverted dermal. Fine/surface layer approximation (top stitches):  Suture size:  5-0 Suture type: nylon   Stitches: simple running   Suture removal (days):  7 Hemostasis achieved with: suture and pressure Outcome: patient tolerated procedure well with no complications   Post-procedure details: sterile dressing applied and wound care instructions given   Dressing type: bandage and pressure dressing (mupirocin)    mupirocin ointment (BACTROBAN) 2 %  Specimen 1 - Surgical pathology Differential Diagnosis: Biopsy proven BCC Check Margins: Yes Well healed biopsy site [phone]  Neoplasm of skin R cheek middle base of excision   SQ fat shaving  Lesion diameter (cm):  0.5 Informed consent: discussed and consent obtained   Timeout: patient name, date of birth, surgical site, and procedure verified   Procedure prep:  Patient was prepped and draped in usual sterile fashion Prep type:  Isopropyl alcohol Anesthesia: the lesion was anesthetized in a standard fashion   Anesthetic:  1% lidocaine w/ epinephrine 1-100,000 buffered w/ 8.4% NaHCO3 Instrument used: #15 blade   Hemostasis achieved with: pressure, aluminum chloride and electrodesiccation   Outcome: patient tolerated procedure well   Post-procedure details:  sterile dressing applied and wound care instructions given   Dressing type: bandage and bacitracin    Specimen 2 - Surgical pathology Differential Diagnosis: BCC vs other deep margin of  BCC excision site Check Margins: yes Mid base of excision site  Deep margin of BCC excision site  Inflamed seborrheic keratosis (2) Left Forearm x 1, left lat calf x 1  Destruction of lesion - Left Forearm x 1, left lat calf x 1 Complexity: simple   Destruction method: cryotherapy   Informed consent: discussed and consent obtained   Timeout:  patient name, date of birth, surgical site, and procedure verified Lesion destroyed using liquid nitrogen: Yes   Region frozen until ice ball extended beyond lesion: Yes   Outcome: patient tolerated procedure well with no complications   Post-procedure details: wound care instructions given    Actinic Damage - chronic, secondary to cumulative UV radiation exposure/sun exposure over time - diffuse scaly erythematous macules with underlying dyspigmentation - Recommend daily broad spectrum sunscreen SPF 30+ to sun-exposed areas, reapply every 2 hours as needed.  - Call for new or changing lesions.  Return today (on 06/04/2020) for 6-7 days for suture removal and 1-2 months for follow up with Dr. Nehemiah Massed.   I, Ashok Cordia, CMA, am acting as scribe for Sarina Ser, MD .  Documentation: I have reviewed the above documentation for accuracy and completeness, and I agree with the above.  Sarina Ser, MD

## 2020-06-04 NOTE — Patient Instructions (Signed)

## 2020-06-05 ENCOUNTER — Telehealth: Payer: Self-pay

## 2020-06-05 NOTE — Telephone Encounter (Signed)
Our office does not do lab appointments for UTI. Patient will need to be scheduled for an office visit for evaluation of symptoms. Please schedule appointment.

## 2020-06-05 NOTE — Telephone Encounter (Signed)
Copied from [place] [id number]. Topic: General - Other >> Jun 05, 2020  9:43 AM Hinda Lenis D wrote: Please call PT he needs a Lab appt / possible UTI / he need to be tested for another dr appt  / please advise

## 2020-06-05 NOTE — Telephone Encounter (Signed)
Patient doing fine after yesterdays surgery./sh 

## 2020-06-06 ENCOUNTER — Encounter: Payer: Self-pay | Admitting: Physician Assistant

## 2020-06-06 ENCOUNTER — Ambulatory Visit (INDEPENDENT_AMBULATORY_CARE_PROVIDER_SITE_OTHER): Payer: Medicare Other | Admitting: Physician Assistant

## 2020-06-06 ENCOUNTER — Other Ambulatory Visit: Payer: Self-pay

## 2020-06-06 VITALS — BP 132/75 | HR 59 | Temp 96.8°F | Resp 18 | Ht 72.0 in | Wt 221.0 lb

## 2020-06-06 DIAGNOSIS — R3989 Other symptoms and signs involving the genitourinary system: Secondary | ICD-10-CM | POA: Diagnosis not present

## 2020-06-06 DIAGNOSIS — C61 Malignant neoplasm of prostate: Secondary | ICD-10-CM

## 2020-06-06 LAB — POCT URINALYSIS DIPSTICK
Bilirubin, UA: NEGATIVE
Blood, UA: NEGATIVE
Glucose, UA: NEGATIVE
Ketones, UA: NEGATIVE
Nitrite, UA: NEGATIVE
Protein, UA: POSITIVE — AB
Spec Grav, UA: >=1.03 — AB (ref 1.010–1.025)
Urobilinogen, UA: 0.2 E.U./dL
pH, UA: 6 (ref 5.0–8.0)

## 2020-06-06 NOTE — Progress Notes (Signed)
Established patient visit   Patient: [name]   DOB: 01/31/1946   74 y.o. Male  MRN: [id number] Visit Date: 06/06/2020  Today's healthcare provider: Trinna Post, PA-C   Chief Complaint  Patient presents with  . Urinary Frequency   Subjective    HPI  Abdominal pressure: Patient has a history of prostate cancer. Patient complains of abdominal pressure and urinary frequency that has been ongoing for several years. He is not having any dysuria, fevers, chills, back pain. He has an upcoming appointment with [facility] urology on 06/12/2020 but his specialist wanted him to rule out a UTI first.      Medications: Outpatient Medications Prior to Visit  Medication Sig  . ALPRAZolam (XANAX) 1 MG tablet TAKE 1/2-1 TABLET BY MOUTH AT BEDTIME  . doxycycline (VIBRAMYCIN) 50 MG capsule Take 1 capsule (50 mg total) by mouth daily.  . mupirocin ointment (BACTROBAN) 2 % Apply 1 application topically daily. With dressing changes  . albuterol (VENTOLIN HFA) 108 (90 Base) MCG/ACT inhaler TAKE 2 PUFFS BY MOUTH EVERY 6 HOURS AS NEEDED FOR WHEEZE OR SHORTNESS OF BREATH (Patient not taking: Reported on 06/06/2020)   No facility-administered medications prior to visit.    Review of Systems  Constitutional: Negative for appetite change, chills and fever.  Respiratory: Negative for chest tightness, shortness of breath and wheezing.   Cardiovascular: Negative for chest pain and palpitations.  Gastrointestinal: Negative for abdominal pain, nausea and vomiting.       Objective    BP 132/75 (BP Location: Left Arm, Patient Position: Sitting, Cuff Size: Large)   Pulse (!) 59   Temp (!) 96.8 F (36 C) (Temporal)   Resp 18   Ht 6' (1.829 m)   Wt 221 lb (100.2 kg)   BMI 29.97 kg/m     Physical Exam Constitutional:      Appearance: Normal appearance.  Cardiovascular:     Rate and Rhythm: Normal rate and regular rhythm.     Heart sounds: Normal heart sounds.  Pulmonary:     Effort:  Pulmonary effort is normal.     Breath sounds: Normal breath sounds.  Abdominal:     General: Bowel sounds are normal. There is no distension.     Palpations: Abdomen is soft.     Tenderness: There is no abdominal tenderness.  Skin:    General: Skin is warm and dry.  Neurological:     Mental Status: He is alert and oriented to person, place, and time. Mental status is at baseline.  Psychiatric:        Mood and Affect: Mood normal.        Behavior: Behavior normal.       Results for orders placed or performed in visit on 06/06/20  POCT Urinalysis Dipstick  Result Value Ref Range   Color, UA yellow    Clarity, UA clear    Glucose, UA Negative Negative   Bilirubin, UA negative    Ketones, UA negative    Spec Grav, UA >=1.030 (A) 1.010 - 1.025   Blood, UA negative    pH, UA 6.0 5.0 - 8.0   Protein, UA Positive (A) Negative   Urobilinogen, UA 0.2 0.2 or 1.0 E.U./dL   Nitrite, UA negative    Leukocytes, UA Small (1+) (A) Negative   Appearance     Odor      Assessment & Plan    1. Prostate cancer [facility])  Urinalysis looks fairly benign with  just leukocytes. Will send for culture just to confirm.  - Urine Culture - POCT Urinalysis Dipstick  2. Sensation of pressure in bladder area  - Urine Culture - POCT Urinalysis Dipstick   Return if symptoms worsen or fail to improve.      ITrinna Post, PA-C, have reviewed all documentation for this visit. The documentation on 06/06/20 for the exam, diagnosis, procedures, and orders are all accurate and complete.  The entirety of the information documented in the History of Present Illness, Review of Systems and Physical Exam were personally obtained by me. Portions of this information were initially documented by [facility] and reviewed by me for thoroughness and accuracy.   I spent 20 minutes dedicated to the care of this patient on the date of this encounter to include pre-visit review of records, face-to-face time  with the patient discussing UTI, and post visit ordering of testing.    Paulene Floor  [facility] [phone] (phone) [phone] (fax)  [place]

## 2020-06-06 NOTE — Patient Instructions (Signed)
Prostate Cancer  The prostate is a male gland that helps make semen. It is located below a man's bladder, in front of the rectum. Prostate cancer is when abnormal cells grow in this gland. What are the causes? The cause of this condition is not known. What increases the risk? You are more likely to develop this condition if:  You are 76 years of age or older.  You are African American.  You have a family history of prostate cancer.  You have a family history of breast cancer. What are the signs or symptoms? Symptoms of this condition include:  A need to pee often.  Peeing that is weak, or pee that stops and starts.  Trouble starting or stopping your pee.  Inability to pee.  Blood in your pee or semen.  Pain in the lower back, lower belly (abdomen), hips, or upper thighs.  Trouble getting an erection.  Trouble emptying all of your pee. How is this treated? Treatment for this condition depends on your age, your health, the kind of treatment you like, and how far the cancer has spread. Treatments include:  Being watched. This is called observation. You will be tested from time to time, but you will not get treated. Tests are to make sure that the cancer is not growing.  Surgery. This may be done to remove the prostate, to remove the testicles, or to freeze or kill cancer cells.  Radiation. This uses a strong beam to kill cancer cells.  Ultrasound energy. This uses strong sound waves to kill cancer cells.  Chemotherapy. This uses medicines that stop cancer cells from increasing. This kills cancer cells and healthy cells.  Targeted therapy. This kills cancer cells only. Healthy cells are not affected.  Hormone treatment. This stops the body from making hormones that help the cancer cells to grow. Follow these instructions at home:  Take over-the-counter and prescription medicines only as told by your doctor.  Eat a healthy diet.  Get plenty of sleep.  Ask your  doctor for help to find a support group for men with prostate cancer.  If you have to go to the hospital, let your cancer doctor (oncologist) know.  Treatment may affect your ability to have sex. Touch, hold, hug, and caress your partner to have intimate moments.  Keep all follow-up visits as told by your doctor. This is important. Contact a doctor if:  You have new or more trouble peeing.  You have new or more blood in your pee.  You have new or more pain in your hips, back, or chest. Get help right away if:  You have weakness in your legs.  You lose feeling in your legs.  You cannot control your pee or your poop (stool).  You have chills or a fever. Summary  The prostate is a male gland that helps make semen.  Prostate cancer is when abnormal cells grow in this gland.  Treatment includes doing surgery, using medicines, using very strong beams, or watching without treatment.  Ask your doctor for help to find a support group for men with prostate cancer.  Contact a doctor if you have problems peeing or have any new pain that you did not have before. This information is not intended to replace advice given to you by your health care provider. Make sure you discuss any questions you have with your health care provider. Document Revised: 04/11/2019 Document Reviewed: 04/11/2019 Elsevier Patient Education  2021 Elsevier Inc.  

## 2020-06-07 ENCOUNTER — Encounter: Payer: Self-pay | Admitting: Dermatology

## 2020-06-09 LAB — URINE CULTURE

## 2020-06-10 ENCOUNTER — Other Ambulatory Visit: Payer: Self-pay

## 2020-06-10 ENCOUNTER — Ambulatory Visit (INDEPENDENT_AMBULATORY_CARE_PROVIDER_SITE_OTHER): Payer: Medicare Other

## 2020-06-10 DIAGNOSIS — Z85828 Personal history of other malignant neoplasm of skin: Secondary | ICD-10-CM

## 2020-06-10 NOTE — Progress Notes (Addendum)
   Follow-Up Visit   Subjective  LEMARIO CHAIKIN is a 75 y.o. male who presents for the following: Basal Cell Carcinoma (6 day post op - BCC Margins Free).    The following portions of the chart were reviewed this encounter and updated as appropriate:       Review of Systems:  No other skin or systemic complaints except as noted in HPI or Assessment and Plan.  Objective  Well appearing patient in no apparent distress; mood and affect are within normal limits.  A focused examination was performed including right cheek. Relevant physical exam findings are noted in the Assessment and Plan.  Objective  Right cheek: Well healing excision site.   Assessment & Plan  History of basal cell carcinoma (BCC) Right cheek  Encounter for Removal of Sutures - Incision site at the right cheek is clean, dry and intact - Wound cleansed, sutures removed, wound cleansed and steri strips applied.  - Discussed pathology results showing BCC margins free  - Patient advised to keep steri-strips dry until they fall off. - Scars remodel for a full year. - Once steri-strips fall off, patient can apply over-the-counter silicone scar cream each night to help with scar remodeling if desired. - Patient advised to call with any concerns or if they notice any new or changing lesions.   Return in about 1 month (around 07/08/2020).

## 2020-06-12 DIAGNOSIS — R350 Frequency of micturition: Secondary | ICD-10-CM | POA: Diagnosis not present

## 2020-06-12 DIAGNOSIS — C61 Malignant neoplasm of prostate: Secondary | ICD-10-CM | POA: Diagnosis not present

## 2020-06-12 DIAGNOSIS — R399 Unspecified symptoms and signs involving the genitourinary system: Secondary | ICD-10-CM | POA: Diagnosis not present

## 2020-06-12 DIAGNOSIS — Z6828 Body mass index (BMI) 28.0-28.9, adult: Secondary | ICD-10-CM | POA: Diagnosis not present

## 2020-06-12 DIAGNOSIS — N401 Enlarged prostate with lower urinary tract symptoms: Secondary | ICD-10-CM | POA: Diagnosis not present

## 2020-07-17 ENCOUNTER — Ambulatory Visit (INDEPENDENT_AMBULATORY_CARE_PROVIDER_SITE_OTHER): Payer: Medicare Other | Admitting: Dermatology

## 2020-07-17 ENCOUNTER — Other Ambulatory Visit: Payer: Self-pay

## 2020-07-17 DIAGNOSIS — Z85828 Personal history of other malignant neoplasm of skin: Secondary | ICD-10-CM | POA: Diagnosis not present

## 2020-07-17 DIAGNOSIS — L820 Inflamed seborrheic keratosis: Secondary | ICD-10-CM | POA: Diagnosis not present

## 2020-07-17 DIAGNOSIS — L578 Other skin changes due to chronic exposure to nonionizing radiation: Secondary | ICD-10-CM | POA: Diagnosis not present

## 2020-07-17 DIAGNOSIS — L821 Other seborrheic keratosis: Secondary | ICD-10-CM

## 2020-07-17 NOTE — Progress Notes (Signed)
   Follow-Up Visit   Subjective  [name] is a 75 y.o. male who presents for the following: history of BCC (Of the right cheek - excised 06/04/2020, patient is here today for a recheck of area). He also has an irritating spot of chest.  The following portions of the chart were reviewed this encounter and updated as appropriate:   Tobacco  Allergies  Meds  Problems  Med Hx  Surg Hx  Fam Hx     Review of Systems:  No other skin or systemic complaints except as noted in HPI or Assessment and Plan.  Objective  Well appearing patient in no apparent distress; mood and affect are within normal limits.  A focused examination was performed including the face. Relevant physical exam findings are noted in the Assessment and Plan.  Objective  R cheek: Well healed scar.  Objective  R ant neck x 1: Erythematous keratotic or waxy stuck-on papule or plaque.   Assessment & Plan  History of basal cell carcinoma (BCC) - S/P excision R cheek Clear. Observe for recurrence. Call clinic for new or changing lesions.  Recommend regular skin exams, daily broad-spectrum spf 30+ sunscreen use, and photoprotection.    Continue OTC scar gel daily. Recommend Serica.  Inflamed seborrheic keratosis R anterior chest x 1  Destruction of lesion - R ant chest x 1 Complexity: simple   Destruction method: cryotherapy   Informed consent: discussed and consent obtained   Timeout:  patient name, date of birth, surgical site, and procedure verified Lesion destroyed using liquid nitrogen: Yes   Region frozen until ice ball extended beyond lesion: Yes   Outcome: patient tolerated procedure well with no complications   Post-procedure details: wound care instructions given    Seborrheic Keratoses - Stuck-on, waxy, tan-brown papules and plaques  - Discussed benign etiology and prognosis. - Observe - Call for any changes  Actinic Damage - chronic, secondary to cumulative UV radiation exposure/sun  exposure over time - diffuse scaly erythematous macules with underlying dyspigmentation - Recommend daily broad spectrum sunscreen SPF 30+ to sun-exposed areas, reapply every 2 hours as needed.  - Recommend staying in the shade or wearing long sleeves, sun glasses (UVA+UVB protection) and wide brim hats (4-inch brim around the entire circumference of the hat). - Call for new or changing lesions.  Return in about 6 months (around 01/17/2021) for UBSE.  Luther Redo, CMA, am acting as scribe for Sarina Ser, MD .  Documentation: I have reviewed the above documentation for accuracy and completeness, and I agree with the above.  Sarina Ser, MD

## 2020-07-18 ENCOUNTER — Encounter: Payer: Self-pay | Admitting: Dermatology

## 2020-07-28 ENCOUNTER — Other Ambulatory Visit: Payer: Self-pay | Admitting: Family Medicine

## 2020-07-28 DIAGNOSIS — J40 Bronchitis, not specified as acute or chronic: Secondary | ICD-10-CM

## 2020-07-28 NOTE — Telephone Encounter (Signed)
Requested Prescriptions  Pending Prescriptions Disp Refills  . albuterol (VENTOLIN HFA) 108 (90 Base) MCG/ACT inhaler [Pharmacy Med Name: ALBUTEROL HFA (PROAIR) INHALER] 8.5 each 0    Sig: TAKE 2 PUFFS BY MOUTH EVERY 6 HOURS AS NEEDED FOR WHEEZE OR SHORTNESS OF BREATH     Pulmonology:  Beta Agonists Failed - 07/28/2020 12:32 PM      Failed - One inhaler should last at least one month. If the patient is requesting refills earlier, contact the patient to check for uncontrolled symptoms.      Passed - Valid encounter within last 12 months    Recent Outpatient Visits          1 month ago Prostate cancer [facility])   [facility] Trinna Post, [place]   1 year ago Sleep disturbance   [facility] Birdie Sons, MD   2 years ago [place], PA-C   3 years ago Medicare annual wellness visit, subsequent   [facility] Birdie Sons, MD      Future Appointments            In 5 months Ralene Bathe, MD [place]

## 2020-07-31 DIAGNOSIS — Z6841 Body Mass Index (BMI) 40.0 and over, adult: Secondary | ICD-10-CM | POA: Diagnosis not present

## 2020-07-31 DIAGNOSIS — R350 Frequency of micturition: Secondary | ICD-10-CM | POA: Diagnosis not present

## 2020-07-31 DIAGNOSIS — N401 Enlarged prostate with lower urinary tract symptoms: Secondary | ICD-10-CM | POA: Diagnosis not present

## 2020-08-02 ENCOUNTER — Telehealth: Payer: Self-pay | Admitting: Family Medicine

## 2020-08-02 ENCOUNTER — Other Ambulatory Visit: Payer: Self-pay | Admitting: Family Medicine

## 2020-08-02 DIAGNOSIS — G4700 Insomnia, unspecified: Secondary | ICD-10-CM

## 2020-08-02 NOTE — Telephone Encounter (Signed)
Per last refill given 05/17/2020, patient needs to schedule follow up office visit. Tried calling patient. Left message to call back. OK for PEC to advise and schedule appointment.

## 2020-08-02 NOTE — Telephone Encounter (Signed)
Copied from [place] [id number]. Topic: Quick Communication - Rx Refill/Question >> Aug 02, 2020  3:22 PM Yvette Rack wrote: Medication: ALPRAZolam Duanne Moron) 1 MG tablet  Has the patient contacted their pharmacy? yes   Preferred Pharmacy (with phone number or street name): [id number] [address], [place]  Phone: [phone]  Fax: [phone]  Agent: Please be advised that RX refills may take up to 3 business days. We ask that you follow-up with your pharmacy.

## 2020-08-02 NOTE — Telephone Encounter (Signed)
Requested medication (s) are due for refill today: yes  Requested medication (s) are on the active medication list: yes  Last refill:  06/17/20  Future visit scheduled: no  Notes to clinic:  not delegated    Requested Prescriptions  Pending Prescriptions Disp Refills   ALPRAZolam (XANAX) 1 MG tablet [Pharmacy Med Name: ALPRAZOLAM 1 MG TABLET] 30 tablet 1    Sig: TAKE 1/2 TO 1 TABLET BY MOUTH AT BEDTIME      Not Delegated - Psychiatry:  Anxiolytics/Hypnotics Failed - 08/02/2020 11:19 AM      Failed - This refill cannot be delegated      Failed - Urine Drug Screen completed in last 360 days      Passed - Valid encounter within last 6 months    Recent Outpatient Visits           1 month ago Prostate cancer [facility])   [facility] Trinna Post, [place]   1 year ago Sleep disturbance   [facility] Birdie Sons, MD   2 years ago [place], PA-C   3 years ago Medicare annual wellness visit, subsequent   [facility] Birdie Sons, MD       Future Appointments             In 5 months Ralene Bathe, MD [place]

## 2020-08-02 NOTE — Telephone Encounter (Signed)
This is a duplicate request. Just waiting for approval.

## 2020-08-02 NOTE — Telephone Encounter (Signed)
Pt asked why he would need to set up an appt when he was just seen in January. Pt did not want to set up an appt. Please advise.

## 2020-08-05 DIAGNOSIS — N401 Enlarged prostate with lower urinary tract symptoms: Secondary | ICD-10-CM | POA: Insufficient documentation

## 2020-08-06 NOTE — Telephone Encounter (Signed)
Follow up appointment scheduled for 08/14/2020 at 11am.

## 2020-08-06 NOTE — Telephone Encounter (Signed)
The visit from January 2022 was for an acute visit where he saw [place]. It has been more than a year since he has been in to follow up on medications . Tried calling patient. Left message to call back.

## 2020-08-06 NOTE — Telephone Encounter (Signed)
Pt was returning call, unable to get through to the office, please call back.

## 2020-08-14 ENCOUNTER — Other Ambulatory Visit: Payer: Self-pay

## 2020-08-14 ENCOUNTER — Ambulatory Visit (INDEPENDENT_AMBULATORY_CARE_PROVIDER_SITE_OTHER): Payer: Medicare Other | Admitting: Family Medicine

## 2020-08-14 ENCOUNTER — Encounter: Payer: Self-pay | Admitting: Family Medicine

## 2020-08-14 VITALS — BP 131/79 | HR 70 | Ht 73.0 in | Wt 219.0 lb

## 2020-08-14 DIAGNOSIS — L719 Rosacea, unspecified: Secondary | ICD-10-CM

## 2020-08-14 DIAGNOSIS — G4700 Insomnia, unspecified: Secondary | ICD-10-CM

## 2020-08-14 DIAGNOSIS — G4733 Obstructive sleep apnea (adult) (pediatric): Secondary | ICD-10-CM

## 2020-08-14 DIAGNOSIS — J449 Chronic obstructive pulmonary disease, unspecified: Secondary | ICD-10-CM

## 2020-08-14 DIAGNOSIS — Z87891 Personal history of nicotine dependence: Secondary | ICD-10-CM | POA: Diagnosis not present

## 2020-08-14 MED ORDER — ANORO ELLIPTA 62.5-25 MCG/INH IN AEPB: 1.0000 | INHALATION_SPRAY | Freq: Every day | RESPIRATORY_TRACT | 0 refills | Status: DC

## 2020-08-14 NOTE — Progress Notes (Signed)
Established patient visit   Patient: [name]   DOB: 11-05-1945   75 y.o. Male  MRN: [id number] Visit Date: 08/14/2020  Today's healthcare provider: Lelon Huh, MD   No chief complaint on file.  Subjective    HPI  Follow up for Insomnia:  The patient was last seen for this 02/23/2017.  Changes made at last visit include none; continue as needed alprazolam.  He reports excellent compliance with treatment. He feels that condition is Improved. He is not having side effects.   -----------------------------------------------------------------------------------------  Follow up for OSA:  The patient was last seen for this on 12/07/2018. During that visit sleep study was ordered showing moderate sleep apnea. Recommended trial of CPAP.  He reports poor compliance with treatment. Pt said there was a recall on CPAP device so he never used it. He feels that condition is Unchanged. He is not having side effects.   ----------------------------------------------------------------------------------------- Pt would like to know if there is a stronger inhaler than the one he currently uses. He uses the albuterol a few times every week. He did quit smoking last year.       Medications: Outpatient Medications Prior to Visit  Medication Sig  . albuterol (VENTOLIN HFA) 108 (90 Base) MCG/ACT inhaler TAKE 2 PUFFS BY MOUTH EVERY 6 HOURS AS NEEDED FOR WHEEZE OR SHORTNESS OF BREATH  . ALPRAZolam (XANAX) 1 MG tablet TAKE 1/2 TO 1 TABLET BY MOUTH AT BEDTIME  . doxycycline (VIBRAMYCIN) 50 MG capsule Take 1 capsule (50 mg total) by mouth daily.  . mupirocin ointment (BACTROBAN) 2 % Apply 1 application topically daily. With dressing changes (Patient not taking: Reported on 07/17/2020)   No facility-administered medications prior to visit.    Review of Systems     Objective    BP 131/79 (BP Location: Right Arm, Patient Position: Sitting, Cuff Size: Normal)   Pulse 70   Ht 6'  1" (1.854 m)   Wt 219 lb (99.3 kg)   SpO2 97%   BMI 28.89 kg/m     Physical Exam    General: Appearance:     Well developed, well nourished male in no acute distress  Eyes:    PERRL, conjunctiva/corneas clear, EOM's intact       Lungs:     Clear to auscultation bilaterally, respirations unlabored  Heart:    Normal heart rate. Normal rhythm. No murmurs, rubs, or gallops.   MS:   All extremities are intact.   Neurologic:   Awake, alert, oriented x 3. No apparent focal neurological           defect.          Assessment & Plan     1. Insomnia, unspecified type Doing well with 1/2 alprazolam hs with no daytime cognitive effects.   2. Chronic obstructive pulmonary disease, unspecified COPD type ([place]) Try samples  umeclidinium-vilanterol (ANORO ELLIPTA) 62.5-25 MCG/INH AEPB; Inhale 1 puff into the lungs daily.  Dispense: 28 each; Refill: 0  Call for prescription if effective.   3. Stopped smoking with greater than 30 pack year history Counseled on LDCT lung cancer screening. He will take into consideration.   4. Rosacea, acne Continue doxycycline prescribed by Dr. Nehemiah Massed.   5. OSA (obstructive sleep apnea) Not on CPAP due to recall of machine he was ordered.        The entirety of the information documented in the History of Present Illness, Review of Systems and Physical Exam were personally obtained  by me. Portions of this information were initially documented by the CMA and reviewed by me for thoroughness and accuracy.      Lelon Huh, MD  [facility] [phone] (phone) [phone] (fax)  [place]

## 2020-08-14 NOTE — Patient Instructions (Addendum)
   Screening for lung cancer is recommended for people between 60 and 76 years of age who have smoked the equivalent of 1 pack per day for 20 years. Please call our office at [phone] to schedule a low dose CT lung scan for lung cancer screening.     Start Anora inhaler by taking 1 puff every day. You can still use albuterol inhaler if you need to. Send me a MyChart message for a prescription if it is working well before the samples run out.

## 2020-08-19 ENCOUNTER — Other Ambulatory Visit: Payer: Self-pay | Admitting: Family Medicine

## 2020-08-19 DIAGNOSIS — J40 Bronchitis, not specified as acute or chronic: Secondary | ICD-10-CM

## 2020-08-23 ENCOUNTER — Telehealth: Payer: Self-pay

## 2020-08-23 MED ORDER — ANORO ELLIPTA 62.5-25 MCG/INH IN AEPB: 1.0000 | INHALATION_SPRAY | Freq: Every day | RESPIRATORY_TRACT | 0 refills | Status: DC

## 2020-08-23 NOTE — Telephone Encounter (Signed)
Copied from [place] [phone]. Topic: General - Other >> Aug 23, 2020  2:13 PM Pawlus, Brayton Layman A wrote: Reason for CRM: Pt stated Dr Caryn Section gave him samples of ANORO inhaler and to let him know if he liked it and wanted a prescription. Pt stated he would like a [id number] [address], [place] [address] Phone: [phone] Fax: [phone]

## 2020-09-01 ENCOUNTER — Encounter: Payer: Self-pay | Admitting: Family Medicine

## 2020-09-02 ENCOUNTER — Encounter: Payer: Self-pay | Admitting: Family Medicine

## 2020-09-02 ENCOUNTER — Other Ambulatory Visit: Payer: Self-pay

## 2020-09-02 MED ORDER — ANORO ELLIPTA 62.5-25 MCG/INH IN AEPB: 1.0000 | INHALATION_SPRAY | Freq: Every day | RESPIRATORY_TRACT | 0 refills | Status: DC

## 2020-09-04 DIAGNOSIS — M25561 Pain in right knee: Secondary | ICD-10-CM | POA: Diagnosis not present

## 2020-09-04 DIAGNOSIS — M5416 Radiculopathy, lumbar region: Secondary | ICD-10-CM | POA: Diagnosis not present

## 2020-09-25 DIAGNOSIS — M25561 Pain in right knee: Secondary | ICD-10-CM | POA: Diagnosis not present

## 2020-09-25 DIAGNOSIS — M4726 Other spondylosis with radiculopathy, lumbar region: Secondary | ICD-10-CM | POA: Diagnosis not present

## 2020-09-29 ENCOUNTER — Other Ambulatory Visit: Payer: Self-pay | Admitting: Family Medicine

## 2020-09-29 NOTE — Telephone Encounter (Signed)
Requested Prescriptions  Pending Prescriptions Disp Refills  . ANORO ELLIPTA 62.5-25 MCG/INH AEPB [Pharmacy Med Name: ANORO ELLIPTA 62.5-25 MCG INH] 77 each 0    Sig: TAKE 1 PUFF BY MOUTH EVERY DAY     Pulmonology:  Combination Products Passed - 09/29/2020  9:30 AM      Passed - Valid encounter within last 12 months    Recent Outpatient Visits          1 month ago Insomnia, unspecified type   [facility] Birdie Sons, MD   3 months ago Prostate cancer [facility])   [facility] Trinna Post, [place]   1 year ago Sleep disturbance   [facility] Birdie Sons, MD   2 years ago [place], PA-C   3 years ago Medicare annual wellness visit, subsequent   [facility] Birdie Sons, MD      Future Appointments            In 3 months Ralene Bathe, MD [place]

## 2020-09-30 DIAGNOSIS — M25561 Pain in right knee: Secondary | ICD-10-CM | POA: Diagnosis not present

## 2020-09-30 DIAGNOSIS — M4726 Other spondylosis with radiculopathy, lumbar region: Secondary | ICD-10-CM | POA: Diagnosis not present

## 2020-10-02 DIAGNOSIS — M25561 Pain in right knee: Secondary | ICD-10-CM | POA: Diagnosis not present

## 2020-10-02 DIAGNOSIS — M4726 Other spondylosis with radiculopathy, lumbar region: Secondary | ICD-10-CM | POA: Diagnosis not present

## 2020-10-04 DIAGNOSIS — R350 Frequency of micturition: Secondary | ICD-10-CM | POA: Diagnosis not present

## 2020-10-04 DIAGNOSIS — C61 Malignant neoplasm of prostate: Secondary | ICD-10-CM | POA: Diagnosis not present

## 2020-10-04 DIAGNOSIS — N401 Enlarged prostate with lower urinary tract symptoms: Secondary | ICD-10-CM | POA: Diagnosis not present

## 2020-10-04 DIAGNOSIS — Z8042 Family history of malignant neoplasm of prostate: Secondary | ICD-10-CM | POA: Diagnosis not present

## 2020-10-04 DIAGNOSIS — Z125 Encounter for screening for malignant neoplasm of prostate: Secondary | ICD-10-CM | POA: Diagnosis not present

## 2020-10-04 DIAGNOSIS — F17210 Nicotine dependence, cigarettes, uncomplicated: Secondary | ICD-10-CM | POA: Diagnosis not present

## 2020-10-09 ENCOUNTER — Other Ambulatory Visit: Payer: Self-pay | Admitting: Family Medicine

## 2020-10-09 DIAGNOSIS — G4700 Insomnia, unspecified: Secondary | ICD-10-CM

## 2020-10-09 NOTE — Telephone Encounter (Signed)
Requested medication (s) are due for refill today: yes  Requested medication (s) are on the active medication list: yes  Last refill:  09/05/2020  Future visit scheduled: no  Notes to clinic:  this refill cannot be delegated   Requested Prescriptions  Pending Prescriptions Disp Refills   ALPRAZolam (XANAX) 1 MG tablet [Pharmacy Med Name: ALPRAZOLAM 1 MG TABLET] 30 tablet 1    Sig: TAKE 1/2-1 TABLET BY MOUTH AT BEDTIME      Not Delegated - Psychiatry:  Anxiolytics/Hypnotics Failed - 10/09/2020  8:09 AM      Failed - This refill cannot be delegated      Failed - Urine Drug Screen completed in last 360 days      Passed - Valid encounter within last 6 months    Recent Outpatient Visits           1 month ago Insomnia, unspecified type   [facility] Birdie Sons, MD   4 months ago Prostate cancer [facility])   [facility] Trinna Post, [place]   1 year ago Sleep disturbance   [facility] Birdie Sons, MD   2 years ago [place], PA-C   3 years ago Medicare annual wellness visit, subsequent   [facility] Birdie Sons, MD       Future Appointments             In 3 months Ralene Bathe, MD [place]

## 2020-10-11 DIAGNOSIS — M4726 Other spondylosis with radiculopathy, lumbar region: Secondary | ICD-10-CM | POA: Diagnosis not present

## 2020-10-11 DIAGNOSIS — M25561 Pain in right knee: Secondary | ICD-10-CM | POA: Diagnosis not present

## 2020-10-14 DIAGNOSIS — M47817 Spondylosis without myelopathy or radiculopathy, lumbosacral region: Secondary | ICD-10-CM | POA: Diagnosis not present

## 2020-10-15 DIAGNOSIS — M4726 Other spondylosis with radiculopathy, lumbar region: Secondary | ICD-10-CM | POA: Diagnosis not present

## 2020-10-15 DIAGNOSIS — M25561 Pain in right knee: Secondary | ICD-10-CM | POA: Diagnosis not present

## 2020-10-18 DIAGNOSIS — M4726 Other spondylosis with radiculopathy, lumbar region: Secondary | ICD-10-CM | POA: Diagnosis not present

## 2020-10-18 DIAGNOSIS — M25561 Pain in right knee: Secondary | ICD-10-CM | POA: Diagnosis not present

## 2020-10-21 DIAGNOSIS — M4726 Other spondylosis with radiculopathy, lumbar region: Secondary | ICD-10-CM | POA: Diagnosis not present

## 2020-10-21 DIAGNOSIS — M25561 Pain in right knee: Secondary | ICD-10-CM | POA: Diagnosis not present

## 2020-10-23 DIAGNOSIS — M4726 Other spondylosis with radiculopathy, lumbar region: Secondary | ICD-10-CM | POA: Diagnosis not present

## 2020-10-23 DIAGNOSIS — M25561 Pain in right knee: Secondary | ICD-10-CM | POA: Diagnosis not present

## 2020-10-27 ENCOUNTER — Other Ambulatory Visit: Payer: Self-pay | Admitting: Family Medicine

## 2020-10-27 NOTE — Telephone Encounter (Signed)
Requested Prescriptions  Pending Prescriptions Disp Refills  . ANORO ELLIPTA 62.5-25 MCG/INH AEPB [Pharmacy Med Name: ANORO ELLIPTA 62.5-25 MCG INH] 60 each 0    Sig: INHALE 1 PUFF BY MOUTH EVERY DAY     Pulmonology:  Combination Products Passed - 10/27/2020  9:05 AM      Passed - Valid encounter within last 12 months    Recent Outpatient Visits          2 months ago Insomnia, unspecified type   [facility] Birdie Sons, MD   4 months ago Prostate cancer [facility])   [facility] Trinna Post, [place]   1 year ago Sleep disturbance   [facility] Birdie Sons, MD   2 years ago [place], PA-C   3 years ago Medicare annual wellness visit, subsequent   [facility] Birdie Sons, MD      Future Appointments            In 2 months Ralene Bathe, MD [place]

## 2020-10-31 DIAGNOSIS — M545 Low back pain, unspecified: Secondary | ICD-10-CM | POA: Diagnosis not present

## 2020-10-31 DIAGNOSIS — M47817 Spondylosis without myelopathy or radiculopathy, lumbosacral region: Secondary | ICD-10-CM | POA: Diagnosis not present

## 2020-11-13 ENCOUNTER — Other Ambulatory Visit: Payer: Self-pay | Admitting: Family Medicine

## 2020-11-14 DIAGNOSIS — M47817 Spondylosis without myelopathy or radiculopathy, lumbosacral region: Secondary | ICD-10-CM | POA: Diagnosis not present

## 2020-11-28 DIAGNOSIS — M47817 Spondylosis without myelopathy or radiculopathy, lumbosacral region: Secondary | ICD-10-CM | POA: Diagnosis not present

## 2020-12-02 DIAGNOSIS — M47817 Spondylosis without myelopathy or radiculopathy, lumbosacral region: Secondary | ICD-10-CM | POA: Diagnosis not present

## 2020-12-02 DIAGNOSIS — M25561 Pain in right knee: Secondary | ICD-10-CM | POA: Diagnosis not present

## 2020-12-16 ENCOUNTER — Other Ambulatory Visit: Payer: Self-pay | Admitting: Family Medicine

## 2020-12-16 DIAGNOSIS — G4700 Insomnia, unspecified: Secondary | ICD-10-CM

## 2020-12-16 NOTE — Telephone Encounter (Signed)
Requested medication (s) are due for refill today: yes  Requested medication (s) are on the active medication list: yes   Last refill:  11/13/2020  Future visit scheduled: no  Notes to clinic:  this refill cannot be delegated    Requested Prescriptions  Pending Prescriptions Disp Refills   ALPRAZolam (XANAX) 1 MG tablet [Pharmacy Med Name: ALPRAZOLAM 1 MG TABLET] 30 tablet 1    Sig: TAKE 1/2 TO 1 TABLET BY MOUTH AT BEDTIME      Not Delegated - Psychiatry:  Anxiolytics/Hypnotics Failed - 12/16/2020 12:03 AM      Failed - This refill cannot be delegated      Failed - Urine Drug Screen completed in last 360 days      Passed - Valid encounter within last 6 months    Recent Outpatient Visits           4 months ago Insomnia, unspecified type   [facility] Birdie Sons, MD   6 months ago Prostate cancer [facility])   [facility] Carles Collet M, [place]   2 years ago Sleep disturbance   [facility] Birdie Sons, MD   2 years ago [place], PA-C   3 years ago Medicare annual wellness visit, subsequent   [facility] Birdie Sons, MD       Future Appointments             In 1 month Nehemiah Massed Monia Sabal, MD [place]

## 2021-01-07 ENCOUNTER — Other Ambulatory Visit: Payer: Self-pay | Admitting: Dermatology

## 2021-01-07 DIAGNOSIS — L719 Rosacea, unspecified: Secondary | ICD-10-CM

## 2021-01-20 ENCOUNTER — Ambulatory Visit (INDEPENDENT_AMBULATORY_CARE_PROVIDER_SITE_OTHER): Payer: Medicare Other | Admitting: Dermatology

## 2021-01-20 ENCOUNTER — Other Ambulatory Visit: Payer: Self-pay

## 2021-01-20 DIAGNOSIS — L578 Other skin changes due to chronic exposure to nonionizing radiation: Secondary | ICD-10-CM

## 2021-01-20 DIAGNOSIS — L814 Other melanin hyperpigmentation: Secondary | ICD-10-CM | POA: Diagnosis not present

## 2021-01-20 DIAGNOSIS — Z1283 Encounter for screening for malignant neoplasm of skin: Secondary | ICD-10-CM

## 2021-01-20 DIAGNOSIS — B079 Viral wart, unspecified: Secondary | ICD-10-CM | POA: Diagnosis not present

## 2021-01-20 DIAGNOSIS — Z85828 Personal history of other malignant neoplasm of skin: Secondary | ICD-10-CM | POA: Diagnosis not present

## 2021-01-20 DIAGNOSIS — D1800 Hemangioma unspecified site: Secondary | ICD-10-CM

## 2021-01-20 DIAGNOSIS — D229 Melanocytic nevi, unspecified: Secondary | ICD-10-CM | POA: Diagnosis not present

## 2021-01-20 DIAGNOSIS — B070 Plantar wart: Secondary | ICD-10-CM

## 2021-01-20 DIAGNOSIS — L820 Inflamed seborrheic keratosis: Secondary | ICD-10-CM

## 2021-01-20 DIAGNOSIS — L720 Epidermal cyst: Secondary | ICD-10-CM

## 2021-01-20 DIAGNOSIS — L821 Other seborrheic keratosis: Secondary | ICD-10-CM

## 2021-01-20 NOTE — Progress Notes (Signed)
Follow-Up Visit   Subjective  [name] is a 75 y.o. male who presents for the following: Follow-up (Patient here today for follow up. He reports a few spots at right side of face he would like check. He also is concerned with warts at 2nd toe of right foot and a few spots he noticed at right leg. He has history of bcc at right cheek excised in the past. ). He also has a bump of the left cheek.  The following portions of the chart were reviewed this encounter and updated as appropriate:  Tobacco  Allergies  Meds  Problems  Med Hx  Surg Hx  Fam Hx      Objective  Well appearing patient in no apparent distress; mood and affect are within normal limits.  A focused examination was performed including face, arms, legs, cheek, right foot. Relevant physical exam findings are noted in the Assessment and Plan.  right cheek Smooth white papule(s).   right 2nd toe Verrucous papules -- Discussed viral etiology and contagion.        right mandible x 3 , right lower leg x 1, right thigh x 2 (6) Erythematous keratotic or waxy stuck-on papule or plaque.   Assessment & Plan  Cyst within surgical scar right cheek Discussed treatment options.  Pt opts for excision today. Anesthesia: 1% Lidocaine with epi Size 0.6 cm Area anesthetized followed by simple excision.  Area left for 2ndary intention healing. No pathology sent today. hemostasis was achieved with 20% aluminum chloride solution. The patient tolerated the procedure well. Antibiotic ointment and dressing applied.  Viral wart -biopsy-proven Persistent despite multiple treatments with liquid nitrogen, Canthacur plus, Squiric acid immunotherapy ; Candida antigen injections, right 2nd toe Discussed viral etiology and risk of spread.  Discussed multiple treatments may be required to clear warts.  Discussed possible post-treatment dyspigmentation and risk of recurrence.  Biopsy proven verruca vulgaris at right second toe   Accession: [id number] 01/08/2016  Destruction by bleomycin intralesional injection - right 2nd toe 0.7 cm Location: right 2nd toe   Informed Consent: Discussed risks (infection, pain, bleeding, bruising, thinning of the skin, loss of skin pigment, lack of resolution, and recurrence of lesion) and benefits of the procedure, as well as the alternatives. Informed consent was obtained. Preparation: The area was prepared a standard fashion.  Procedure Details: An intralesional injection was performed with bleomycin. 0.1 cc in total were injected.  Total number of injections: 1  Plan: The patient was instructed on post-op care. Recommend OTC analgesia as needed for pain.  Inflamed seborrheic keratosis right mandible x 3 , right lower leg x 1, right thigh x 2  Destruction of lesion - right mandible x 3 , right lower leg x 1, right thigh x 2 Complexity: simple   Destruction method: cryotherapy   Informed consent: discussed and consent obtained   Timeout:  patient name, date of birth, surgical site, and procedure verified Lesion destroyed using liquid nitrogen: Yes   Region frozen until ice ball extended beyond lesion: Yes   Outcome: patient tolerated procedure well with no complications   Post-procedure details: wound care instructions given   Lentigines - Scattered tan macules - Due to sun exposure - Benign-appering, observe - Recommend daily broad spectrum sunscreen SPF 30+ to sun-exposed areas, reapply every 2 hours as needed. - Call for any changes  Seborrheic Keratoses - Stuck-on, waxy, tan-brown papules and/or plaques  - Benign-appearing - Discussed benign etiology and prognosis. - Observe - Call  for any changes  Melanocytic Nevi - Tan-brown and/or pink-flesh-colored symmetric macules and papules - Benign appearing on exam today - Observation - Call clinic for new or changing moles - Recommend daily use of broad spectrum spf 30+ sunscreen to sun-exposed areas.    Hemangiomas - Red papules - Discussed benign nature - Observe - Call for any changes  Actinic Damage - Chronic condition, secondary to cumulative UV/sun exposure - diffuse scaly erythematous macules with underlying dyspigmentation - Recommend daily broad spectrum sunscreen SPF 30+ to sun-exposed areas, reapply every 2 hours as needed.  - Staying in the shade or wearing long sleeves, sun glasses (UVA+UVB protection) and wide brim hats (4-inch brim around the entire circumference of the hat) are also recommended for sun protection.  - Call for new or changing lesions.  History of Basal Cell Carcinoma of the Skin - No evidence of recurrence today at right cheek  - Recommend regular full body skin exams - Recommend daily broad spectrum sunscreen SPF 30+ to sun-exposed areas, reapply every 2 hours as needed.  - Call if any new or changing lesions are noted between office visits  Skin cancer screening performed today.  Return for 6 week follow on wart at right 2nd toe . IRuthell Rummage, CMA, am acting as scribe for Sarina Ser, MD. Documentation: I have reviewed the above documentation for accuracy and completeness, and I agree with the above.  Sarina Ser, MD

## 2021-01-20 NOTE — Patient Instructions (Signed)
Seborrheic Keratosis  What causes seborrheic keratoses? Seborrheic keratoses are harmless, common skin growths that first appear during adult life.  As time goes by, more growths appear.  Some people may develop a large number of them.  Seborrheic keratoses appear on both covered and uncovered body parts.  They are not caused by sunlight.  The tendency to develop seborrheic keratoses can be inherited.  They vary in color from skin-colored to gray, brown, or even black.  They can be either smooth or have a rough, warty surface.   Seborrheic keratoses are superficial and look as if they were stuck on the skin.  Under the microscope this type of keratosis looks like layers upon layers of skin.  That is why at times the top layer may seem to fall off, but the rest of the growth remains and re-grows.    Treatment Seborrheic keratoses do not need to be treated, but can easily be removed in the office.  Seborrheic keratoses often cause symptoms when they rub on clothing or jewelry.  Lesions can be in the way of shaving.  If they become inflamed, they can cause itching, soreness, or burning.  Removal of a seborrheic keratosis can be accomplished by freezing, burning, or surgery. If any spot bleeds, scabs, or grows rapidly, please return to have it checked, as these can be an indication of a skin cancer.  Cryotherapy Aftercare  Wash gently with soap and water everyday.   Apply Vaseline and Band-Aid daily until healed.    Melanoma ABCDEs  Melanoma is the most dangerous type of skin cancer, and is the leading cause of death from skin disease.  You are more likely to develop melanoma if you: Have light-colored skin, light-colored eyes, or red or blond hair Spend a lot of time in the sun Tan regularly, either outdoors or in a tanning bed Have had blistering sunburns, especially during childhood Have a close family member who has had a melanoma Have atypical moles or large birthmarks  Early detection of  melanoma is key since treatment is typically straightforward and cure rates are extremely high if we catch it early.   The first sign of melanoma is often a change in a mole or a new dark spot.  The ABCDE system is a way of remembering the signs of melanoma.  A for asymmetry:  The two halves do not match. B for border:  The edges of the growth are irregular. C for color:  A mixture of colors are present instead of an even brown color. D for diameter:  Melanomas are usually (but not always) greater than 74m - the size of a pencil eraser. E for evolution:  The spot keeps changing in size, shape, and color.  Please check your skin once per month between visits. You can use a small mirror in front and a large mirror behind you to keep an eye on the back side or your body.   If you see any new or changing lesions before your next follow-up, please call to schedule a visit.  Please continue daily skin protection including broad spectrum sunscreen SPF 30+ to sun-exposed areas, reapplying every 2 hours as needed when you're outdoors.   Staying in the shade or wearing long sleeves, sun glasses (UVA+UVB protection) and wide brim hats (4-inch brim around the entire circumference of the hat) are also recommended for sun protection.    If you have any questions or concerns for your doctor, please call our main line at  [phone] and press option 4 to reach your doctor's medical assistant. If no one answers, please leave a voicemail as directed and we will return your call as soon as possible. Messages left after 4 pm will be answered the following business day.   You may also send [place] a message via [place]. We typically respond to MyChart messages within 1-2 business days.  For prescription refills, please ask your pharmacy to contact our office. Our fax number is [phone].  If you have an urgent issue when the clinic is closed that cannot wait until the next business day, you can page your doctor at  the number below.    Please note that while we do our best to be available for urgent issues outside of office hours, we are not available 24/7.   If you have an urgent issue and are unable to reach [place], you may choose to seek medical care at your doctor's office, retail clinic, urgent care center, or emergency room.  If you have a medical emergency, please immediately call 911 or go to the emergency department.  Pager Numbers  - Dr. Nehemiah Massed: [phone]  - Dr. Laurence Ferrari: [phone]  - Dr. Nicole Kindred: [phone]  In the event of inclement weather, please call our main line at [phone] for an update on the status of any delays or closures.  Dermatology Medication Tips: Please keep the boxes that topical medications come in in order to help keep track of the instructions about where and how to use these. Pharmacies typically print the medication instructions only on the boxes and not directly on the medication tubes.   If your medication is too expensive, please contact our office at [phone] option 4 or send [place] a message through [place].   We are unable to tell what your co-pay for medications will be in advance as this is different depending on your insurance coverage. However, we may be able to find a substitute medication at lower cost or fill out paperwork to get insurance to cover a needed medication.   If a prior authorization is required to get your medication covered by your insurance company, please allow [place] 1-2 business days to complete this process.  Drug prices often vary depending on where the prescription is filled and some pharmacies may offer cheaper prices.  The website www.goodrx.com contains coupons for medications through different pharmacies. The prices here do not account for what the cost may be with help from insurance (it may be cheaper with your insurance), but the website can give you the price if you did not use any insurance.  - You can print the  associated coupon and take it with your prescription to the pharmacy.  - You may also stop by our office during regular business hours and pick up a GoodRx coupon card.  - If you need your prescription sent electronically to a different pharmacy, notify our office through [facility] or by phone at [phone] option 4.

## 2021-01-24 ENCOUNTER — Encounter: Payer: Self-pay | Admitting: Dermatology

## 2021-02-19 DIAGNOSIS — Z23 Encounter for immunization: Secondary | ICD-10-CM | POA: Diagnosis not present

## 2021-02-28 DIAGNOSIS — H02886 Meibomian gland dysfunction of left eye, unspecified eyelid: Secondary | ICD-10-CM | POA: Diagnosis not present

## 2021-02-28 DIAGNOSIS — H02883 Meibomian gland dysfunction of right eye, unspecified eyelid: Secondary | ICD-10-CM | POA: Diagnosis not present

## 2021-02-28 DIAGNOSIS — H04123 Dry eye syndrome of bilateral lacrimal glands: Secondary | ICD-10-CM | POA: Diagnosis not present

## 2021-03-03 ENCOUNTER — Other Ambulatory Visit: Payer: Self-pay

## 2021-03-03 ENCOUNTER — Ambulatory Visit (INDEPENDENT_AMBULATORY_CARE_PROVIDER_SITE_OTHER): Payer: Medicare Other | Admitting: Dermatology

## 2021-03-03 DIAGNOSIS — B079 Viral wart, unspecified: Secondary | ICD-10-CM | POA: Diagnosis not present

## 2021-03-03 NOTE — Patient Instructions (Signed)

## 2021-03-03 NOTE — Progress Notes (Signed)
   Follow-Up Visit   Subjective  [name] is a 75 y.o. male who presents for the following: Follow-up (Patient here today for follow up on wart at right 2nd toe. Reports area has improved some. ).  The following portions of the chart were reviewed this encounter and updated as appropriate:  Tobacco  Allergies  Meds  Problems  Med Hx  Surg Hx  Fam Hx     Review of Systems: No other skin or systemic complaints except as noted in HPI or Assessment and Plan.  Objective  Well appearing patient in no apparent distress; mood and affect are within normal limits.  A focused examination was performed including right 2nd toe. Relevant physical exam findings are noted in the Assessment and Plan.  Right 2nd toe x 1 1 x 0.8 cm  verrucous papule   Assessment & Plan  Viral warts, unspecified type Right 2nd toe x 1  Persistent despite multiple treatments with liquid nitrogen, Canthacur plus, Squairic acid immunotherapy ; Candida antigen injections,  Discussed viral etiology and risk of spread.  Discussed multiple treatments may be required to clear warts.  Discussed possible post-treatment dyspigmentation and risk of recurrence.   Biopsy proven verruca vulgaris at right second toe  Accession: [id number] 01/08/2016   Destruction by bleomycin intralesional injection - right 2nd toe 1 x 0.8 cm Location: right 2nd toe    Informed Consent: Discussed risks (infection, pain, bleeding, bruising, thinning of the skin, loss of skin pigment, lack of resolution, and recurrence of lesion) and benefits of the procedure, as well as the alternatives. Informed consent was obtained. Preparation: The area was prepared a standard fashion.   Procedure Details: An intralesional injection was performed with bleomycin. 0.1 cc in total were injected.   Total number of injections: 1   Plan: The patient was instructed on post-op care. Recommend OTC analgesia as needed for pain.   Destruction of lesion -  Right 2nd toe x 1 Complexity: simple   Destruction method: cryotherapy   Informed consent: discussed and consent obtained   Timeout:  patient name, date of birth, surgical site, and procedure verified Lesion destroyed using liquid nitrogen: Yes   Region frozen until ice ball extended beyond lesion: Yes   Outcome: patient tolerated procedure well with no complications   Post-procedure details: wound care instructions given    Return for 2 - 6 follow up on warts . IRuthell Rummage, CMA, am acting as scribe for Sarina Ser, MD. Documentation: I have reviewed the above documentation for accuracy and completeness, and I agree with the above.  Sarina Ser, MD

## 2021-03-04 ENCOUNTER — Encounter: Payer: Self-pay | Admitting: Dermatology

## 2021-03-07 DIAGNOSIS — M25561 Pain in right knee: Secondary | ICD-10-CM | POA: Diagnosis not present

## 2021-03-07 DIAGNOSIS — M47817 Spondylosis without myelopathy or radiculopathy, lumbosacral region: Secondary | ICD-10-CM | POA: Diagnosis not present

## 2021-03-11 ENCOUNTER — Other Ambulatory Visit: Payer: Self-pay | Admitting: Dermatology

## 2021-03-11 DIAGNOSIS — L719 Rosacea, unspecified: Secondary | ICD-10-CM

## 2021-04-02 DIAGNOSIS — Z125 Encounter for screening for malignant neoplasm of prostate: Secondary | ICD-10-CM | POA: Diagnosis not present

## 2021-04-16 ENCOUNTER — Ambulatory Visit (INDEPENDENT_AMBULATORY_CARE_PROVIDER_SITE_OTHER): Payer: Medicare Other | Admitting: Dermatology

## 2021-04-16 ENCOUNTER — Other Ambulatory Visit: Payer: Self-pay

## 2021-04-16 DIAGNOSIS — B079 Viral wart, unspecified: Secondary | ICD-10-CM

## 2021-04-16 DIAGNOSIS — L720 Epidermal cyst: Secondary | ICD-10-CM | POA: Diagnosis not present

## 2021-04-16 NOTE — Patient Instructions (Signed)

## 2021-04-16 NOTE — Progress Notes (Signed)
   Follow-Up Visit   Subjective  [name] is a 75 y.o. male who presents for the following: Wart (R 2nd toe - previously treated with LN2 and Bleomycin injections #2, some improvement per patient but still present and persistent. ). Patient has a white papule on his R zygoma he would like checked today.   The following portions of the chart were reviewed this encounter and updated as appropriate:   Tobacco  Allergies  Meds  Problems  Med Hx  Surg Hx  Fam Hx     Review of Systems:  No other skin or systemic complaints except as noted in HPI or Assessment and Plan.  Objective  Well appearing patient in no apparent distress; mood and affect are within normal limits.  A focused examination was performed including the R foot. Relevant physical exam findings are noted in the Assessment and Plan.  R 2nd toe Verrucous papules -- Discussed viral etiology and contagion. 0.9 x 0.8 cm.       Assessment & Plan  Viral wart -biopsy-proven R 2nd toe Persistent despite multiple treatments with liquid nitrogen, Canthacur plus, Squairic acid immunotherapy ; Candida antigen injections.  Currently treating with bleomycin injections and liquid nitrogen destruction treatments.  Discussed viral etiology and risk of spread.  Discussed multiple treatments may be required to clear warts.  Discussed possible post-treatment dyspigmentation and risk of recurrence.  Consider shave removal with pathology (again) if not improving at f/u appointment.   Intralesional injection - R 2nd toe Location: R 2nd toe  Informed Consent: Discussed risks (infection, pain, bleeding, bruising, thinning of the skin, loss of skin pigment, lack of resolution, and recurrence of lesion) and benefits of the procedure, as well as the alternatives. Informed consent was obtained. Preparation: The area was prepared a standard fashion.  Procedure Details: An intralesional injection was performed with bleomycin. 0.1 cc in  total were injected.  Total number of injections: 2  Plan: The patient was instructed on post-op care. Recommend OTC analgesia as needed for pain.  [facility] [id number]  Destruction of lesion - R 2nd toe Complexity: simple   Destruction method: cryotherapy   Informed consent: discussed and consent obtained   Timeout:  patient name, date of birth, surgical site, and procedure verified Lesion destroyed using liquid nitrogen: Yes   Region frozen until ice ball extended beyond lesion: Yes   Outcome: patient tolerated procedure well with no complications   Post-procedure details: wound care instructions given    Mil[place] - face - tiny firm white papules - type of cyst - benign - may be extracted if symptomatic - observe  Return for wart f/u in 6 weeks.  Luther Redo, CMA, am acting as scribe for Sarina Ser, MD . Documentation: I have reviewed the above documentation for accuracy and completeness, and I agree with the above.  Sarina Ser, MD

## 2021-04-20 ENCOUNTER — Other Ambulatory Visit: Payer: Self-pay | Admitting: Family Medicine

## 2021-04-20 DIAGNOSIS — G4700 Insomnia, unspecified: Secondary | ICD-10-CM

## 2021-04-20 NOTE — Telephone Encounter (Signed)
Requested medication (s) are due for refill today: yes  Requested medication (s) are on the active medication list: yes  Last refill:  12/16/20 #30 3 RF  Future visit scheduled: no  Notes to clinic:  med not delegated to NT to RF   Requested Prescriptions  Pending Prescriptions Disp Refills   ALPRAZolam (XANAX) 1 MG tablet [Pharmacy Med Name: ALPRAZOLAM 1 MG TABLET] 30 tablet     Sig: TAKE 1/2-1 TABLET BY MOUTH AT BEDTIME     Not Delegated - Psychiatry:  Anxiolytics/Hypnotics Failed - 04/20/2021 12:20 PM      Failed - This refill cannot be delegated      Failed - Urine Drug Screen completed in last 360 days      Failed - Valid encounter within last 6 months    Recent Outpatient Visits           8 months ago Insomnia, unspecified type   [facility] Birdie Sons, MD   10 months ago Prostate cancer [facility])   [facility] Carles Collet M, [place]   2 years ago Sleep disturbance   [facility] Birdie Sons, MD   3 years ago [place], PA-C   4 years ago Medicare annual wellness visit, subsequent   [facility] Birdie Sons, MD       Future Appointments             In 1 month Nehemiah Massed Monia Sabal, MD Portal

## 2021-04-25 ENCOUNTER — Encounter: Payer: Self-pay | Admitting: Dermatology

## 2021-04-25 DIAGNOSIS — H02883 Meibomian gland dysfunction of right eye, unspecified eyelid: Secondary | ICD-10-CM | POA: Diagnosis not present

## 2021-04-25 DIAGNOSIS — H04123 Dry eye syndrome of bilateral lacrimal glands: Secondary | ICD-10-CM | POA: Diagnosis not present

## 2021-04-25 DIAGNOSIS — H02886 Meibomian gland dysfunction of left eye, unspecified eyelid: Secondary | ICD-10-CM | POA: Diagnosis not present

## 2021-05-17 ENCOUNTER — Other Ambulatory Visit: Payer: Self-pay | Admitting: Dermatology

## 2021-05-17 DIAGNOSIS — L719 Rosacea, unspecified: Secondary | ICD-10-CM

## 2021-05-22 DIAGNOSIS — M791 Myalgia, unspecified site: Secondary | ICD-10-CM | POA: Diagnosis not present

## 2021-05-22 DIAGNOSIS — M47817 Spondylosis without myelopathy or radiculopathy, lumbosacral region: Secondary | ICD-10-CM | POA: Diagnosis not present

## 2021-05-28 ENCOUNTER — Other Ambulatory Visit: Payer: Self-pay

## 2021-05-28 ENCOUNTER — Ambulatory Visit (INDEPENDENT_AMBULATORY_CARE_PROVIDER_SITE_OTHER): Payer: Medicare Other | Admitting: Dermatology

## 2021-05-28 DIAGNOSIS — Z85828 Personal history of other malignant neoplasm of skin: Secondary | ICD-10-CM | POA: Diagnosis not present

## 2021-05-28 DIAGNOSIS — B079 Viral wart, unspecified: Secondary | ICD-10-CM

## 2021-05-28 NOTE — Progress Notes (Signed)
Follow-Up Visit   Subjective  [name] is a 76 y.o. male who presents for the following: Warts (R 2nd toe,6 wk f/u, last visit treated with  LN2, bleomycin). Pt has hx BCC excised from R cheek.  The following portions of the chart were reviewed this encounter and updated as appropriate:   Tobacco   Allergies   Meds   Problems   Med Hx   Surg Hx   Fam Hx      Review of Systems:  No other skin or systemic complaints except as noted in HPI or Assessment and Plan.  Objective  Well appearing patient in no apparent distress; mood and affect are within normal limits.  A focused examination was performed including right foot. Relevant physical exam findings are noted in the Assessment and Plan.  R 2nd toe 0.7cm verrucous pap   Assessment & Plan  Viral wart -biopsy-proven R 2nd toe He has failed multiple treatments including liquid nitrogen destruction; Squairic acid immunotherapy application; topical wart treatments; Candida antigen immunotherapy injections; plan shave removal. He is now getting bleomycin injections. We had discussed anesthesia and shave removal again today but he declines that procedure today. Discussed viral etiology and risk of spread.  Discussed multiple treatments may be required to clear warts.  Discussed possible post-treatment dyspigmentation and risk of recurrence.  Improving LN2 x 1 Bleomycin injection x 1  Start prescription 5-fluorouracil/salicylic acid wart paste from [facility] nightly under occlusion. Reviewed risk of irritation and risk scarring if applied to normal skin. If irritation develops, stop medication for a few days until area calm, then restart a very small amount just to the wart. This medication cannot be used by pregnant women. Patient advised they will receive an email from the [facility] and can purchase the medication online through a link in the email.   Destruction of lesion - R 2nd toe Complexity: simple    Destruction method: cryotherapy   Informed consent: discussed and consent obtained   Timeout:  patient name, date of birth, surgical site, and procedure verified Lesion destroyed using liquid nitrogen: Yes   Region frozen until ice ball extended beyond lesion: Yes   Outcome: patient tolerated procedure well with no complications   Post-procedure details: wound care instructions given    Intralesional injection - R 2nd toe Location: R 2nd toe  Informed Consent: Discussed risks (infection, pain, bleeding, bruising, thinning of the skin, loss of skin pigment, lack of resolution, and recurrence of lesion) and benefits of the procedure, as well as the alternatives. Informed consent was obtained. Preparation: The area was prepared a standard fashion.  Procedure Details: An intralesional injection was performed with bleomycin. 0.1 cc in total were injected.  Total number of injections: 1  Plan: The patient was instructed on post-op care. Recommend OTC analgesia as needed for pain.  Lot [id number] P2 exp 04/09/2022  History of Basal Cell Carcinoma of the Skin -right cheek - No evidence of recurrence today - Recommend regular full body skin exams - Recommend daily broad spectrum sunscreen SPF 30+ to sun-exposed areas, reapply every 2 hours as needed.  - Call if any new or changing lesions are noted between office visits -Recommend total-body skin exam at follow-up.  We will schedule.  Return in about 6 weeks (around 07/09/2021) for TBSE, wart f/u.  I, Othelia Pulling, RMA, am acting as scribe for Sarina Ser, MD .  Documentation: I have reviewed the above documentation for accuracy and completeness, and I agree  with the above.  Sarina Ser, MD

## 2021-05-28 NOTE — Patient Instructions (Addendum)
If You Need Anything After Your Visit ° °If you have any questions or concerns for your doctor, please call our main line at [phone] and press option 4 to reach your doctor's medical assistant. If no one answers, please leave a voicemail as directed and we will return your call as soon as possible. Messages left after 4 pm will be answered the following business day.  ° °You may also send us a message via MyChart. We typically respond to MyChart messages within 1-2 business days. ° °For prescription refills, please ask your pharmacy to contact our office. Our fax number is [phone]. ° °If you have an urgent issue when the clinic is closed that cannot wait until the next business day, you can page your doctor at the number below.   ° °Please note that while we do our best to be available for urgent issues outside of office hours, we are not available 24/7.  ° °If you have an urgent issue and are unable to reach us, you may choose to seek medical care at your doctor's office, retail clinic, urgent care center, or emergency room. ° °If you have a medical emergency, please immediately call 911 or go to the emergency department. ° °Pager Numbers ° °- Dr. Kowalski: [phone] ° °- Dr. Moye: [phone] ° °- Dr. Stewart: [phone] ° °In the event of inclement weather, please call our main line at [phone] for an update on the status of any delays or closures. ° °Dermatology Medication Tips: °Please keep the boxes that topical medications come in in order to help keep track of the instructions about where and how to use these. Pharmacies typically print the medication instructions only on the boxes and not directly on the medication tubes.  ° °If your medication is too expensive, please contact our office at [phone] option 4 or send us a message through MyChart.  ° °We are unable to tell what your co-pay for medications will be in advance as this is different depending on your insurance coverage.  However, we may be able to find a substitute medication at lower cost or fill out paperwork to get insurance to cover a needed medication.  ° °If a prior authorization is required to get your medication covered by your insurance company, please allow us 1-2 business days to complete this process. ° °Drug prices often vary depending on where the prescription is filled and some pharmacies may offer cheaper prices. ° °The website www.goodrx.com contains coupons for medications through different pharmacies. The prices here do not account for what the cost may be with help from insurance (it may be cheaper with your insurance), but the website can give you the price if you did not use any insurance.  °- You can print the associated coupon and take it with your prescription to the pharmacy.  °- You may also stop by our office during regular business hours and pick up a GoodRx coupon card.  °- If you need your prescription sent electronically to a different pharmacy, notify our office through [facility] MyChart or by phone at [phone] option 4. ° ° ° ° °Si Usted Necesita Algo Después de Su Visita ° °También puede enviarnos un mensaje a través de MyChart. Por lo general respondemos a los mensajes de MyChart en el transcurso de 1 a 2 días hábiles. ° °Para renovar recetas, por favor pida a su farmacia que se ponga en contacto con nuestra oficina. Nuestro número de fax es el [phone]. ° °Si tiene   un asunto urgente cuando la clnica est cerrada y que no puede esperar hasta el siguiente da hbil, puede llamar/localizar a su doctor(a) al nmero que aparece a continuacin.   Por favor, tenga en cuenta que aunque hacemos todo lo posible para estar disponibles para asuntos urgentes fuera del horario de [place], no estamos disponibles las 24 horas del da, los 7 das de la [place].   Si tiene un problema urgente y no puede comunicarse con nosotros, puede optar por buscar atencin mdica  en el consultorio de su  doctor(a), en una clnica privada, en un centro de atencin urgente o en una sala de emergencias.  Si tiene Engineering geologist, por favor llame inmediatamente al 911 o vaya a la sala de emergencias.  Nmeros de bper  - Dr. Nehemiah Massed: [phone]  - Dra. Moye: [phone]  - Dra. Nicole Kindred: [phone]  En caso de inclemencias del [place], por favor llame a Johnsie Kindred principal al [phone] para una actualizacin sobre el [place] de cualquier retraso o cierre.  Consejos para la medicacin en dermatologa: Por favor, guarde las cajas en las que vienen los medicamentos de uso tpico para ayudarle a seguir las instrucciones sobre dnde y cmo usarlos. Las farmacias generalmente imprimen las instrucciones del medicamento slo en las cajas y no directamente en los tubos del [place].   Si su medicamento es muy caro, por favor, pngase en contacto con Zigmund Daniel llamando al [phone] y presione la opcin 4 o envenos un mensaje a travs de Pharmacist, community.   No podemos decirle cul ser su copago por los medicamentos por adelantado ya que esto es diferente dependiendo de la cobertura de su seguro. Sin embargo, es posible que podamos encontrar un medicamento sustituto a Electrical engineer un formulario para que el seguro cubra el medicamento que se considera necesario.   Si se requiere una autorizacin previa para que su compaa de seguros [place] su medicamento, por favor permtanos de 1 a 2 das hbiles para completar este proceso.  Los precios de los medicamentos varan con frecuencia dependiendo del Environmental consultant de dnde se surte la receta y alguna farmacias pueden ofrecer precios ms baratos.  El sitio web www.goodrx.com tiene cupones para medicamentos de Airline pilot. Los precios aqu no tienen en cuenta lo que podra costar con la ayuda del seguro (puede ser ms barato con su seguro), pero el sitio web puede darle el precio si no utiliz Research scientist (physical sciences).  - Puede imprimir el cupn  correspondiente y llevarlo con su receta a la farmacia.  - Tambin puede pasar por nuestra oficina durante el horario de atencin regular y Charity fundraiser una tarjeta de cupones de GoodRx.  - Si necesita que su receta se enve electrnicamente a una farmacia diferente, informe a nuestra oficina a travs de MyChart de [place] o por telfono llamando al [phone] y presione la opcin 4.   Cryotherapy Aftercare  Wash gently with soap and water everyday.   Apply Vaseline and Band-Aid daily until healed.    Instructions for [facility] Medications  One or more of your medications was sent to the [facility] mail order compounding pharmacy. You will receive an email from them and can purchase the medicine through that link. It will then be mailed to your home at the address you confirmed. If for any reason you do not receive an email from them, please check your spam folder. If you still do not find the email, please let us know. [facility] phone number is [phone].

## 2021-05-29 ENCOUNTER — Encounter: Payer: Self-pay | Admitting: Dermatology

## 2021-06-11 DIAGNOSIS — M47817 Spondylosis without myelopathy or radiculopathy, lumbosacral region: Secondary | ICD-10-CM | POA: Diagnosis not present

## 2021-07-14 ENCOUNTER — Ambulatory Visit: Payer: Medicare Other | Admitting: Dermatology

## 2021-07-15 ENCOUNTER — Other Ambulatory Visit: Payer: Self-pay | Admitting: Dermatology

## 2021-07-15 DIAGNOSIS — L719 Rosacea, unspecified: Secondary | ICD-10-CM

## 2021-08-13 ENCOUNTER — Encounter: Payer: Self-pay | Admitting: Family Medicine

## 2021-08-13 ENCOUNTER — Ambulatory Visit (INDEPENDENT_AMBULATORY_CARE_PROVIDER_SITE_OTHER): Payer: Medicare Other | Admitting: Family Medicine

## 2021-08-13 ENCOUNTER — Ambulatory Visit
Admission: RE | Admit: 2021-08-13 | Discharge: 2021-08-13 | Disposition: A | Discharge status: Home / Self Care | Payer: Medicare Other | Attending: Family Medicine | Admitting: Family Medicine

## 2021-08-13 ENCOUNTER — Ambulatory Visit
Admission: RE | Admit: 2021-08-13 | Discharge: 2021-08-13 | Disposition: A | Discharge status: Home / Self Care | Payer: Medicare Other | Source: Ambulatory Visit | Attending: Family Medicine | Admitting: Family Medicine

## 2021-08-13 VITALS — BP 125/60 | HR 67 | Temp 97.5°F | Resp 14 | Wt 230.3 lb

## 2021-08-13 DIAGNOSIS — J449 Chronic obstructive pulmonary disease, unspecified: Secondary | ICD-10-CM

## 2021-08-13 DIAGNOSIS — R0609 Other forms of dyspnea: Secondary | ICD-10-CM

## 2021-08-13 DIAGNOSIS — R059 Cough, unspecified: Secondary | ICD-10-CM | POA: Diagnosis not present

## 2021-08-13 DIAGNOSIS — K429 Umbilical hernia without obstruction or gangrene: Secondary | ICD-10-CM

## 2021-08-13 DIAGNOSIS — R0602 Shortness of breath: Secondary | ICD-10-CM | POA: Diagnosis not present

## 2021-08-13 DIAGNOSIS — J439 Emphysema, unspecified: Secondary | ICD-10-CM | POA: Diagnosis not present

## 2021-08-13 NOTE — Patient Instructions (Signed)
Please review the attached list of medications and notify my office if there are any errors.   Please go to the lab draw station in [address] on the second floor of [facility] . Normal hours are 8:00am to 11:30am and 1:00pm to 4:00pm Monday through Friday   Go to the [facility] on [address] for chest Xray  

## 2021-08-13 NOTE — Progress Notes (Signed)
?  ?I,Jana Robinson,acting as a scribe for Lelon Huh, MD.,have documented all relevant documentation on the behalf of Lelon Huh, MD,as directed by  Lelon Huh, MD while in the presence of Lelon Huh, MD. ? ? ? ?Established patient visit ? ? ?Patient: [name]   DOB: 1946/02/11   76 y.o. Male  MRN: [id number] ?Visit Date: 08/13/2021 ? ?Today's healthcare provider: Lelon Huh, MD  ? ?No chief complaint on file. ? ?Subjective  ?  ?HPI  ?Patient wants to know if he has a herniated "bully button"  Reports no pain or discomfort, just skin sticking out.  Reports he noticed it x 2 months ago.  ? ?Also having breathing and coughing problems.  Trouble walking up stairs and distances x 2 years and progressively getting worse. Gets winded easily.   He has known history of COPD for which he was last seen in April of last year and given samples and subsequent prescription for Anoro Ellipta. He stopped the Anoro several months ago as he was doing better.  ? ?Medications: ?Outpatient Medications Prior to Visit  ?Medication Sig  ? albuterol (VENTOLIN HFA) 108 (90 Base) MCG/ACT inhaler TAKE 2 PUFFS BY MOUTH EVERY 6 HOURS AS NEEDED FOR WHEEZE OR SHORTNESS OF BREATH  ? ALPRAZolam (XANAX) 1 MG tablet TAKE 1/2-1 TABLET BY MOUTH AT BEDTIME  ? diphenhydramine-acetaminophen (TYLENOL PM) 25-500 MG TABS tablet Take 1 tablet by mouth at bedtime as needed.  ? doxycycline (VIBRAMYCIN) 50 MG capsule TAKE 1 CAPSULE BY MOUTH EVERY DAY  ? finasteride (PROSCAR) 5 MG tablet Take 5 mg by mouth daily.  ? terazosin (HYTRIN) 2 MG capsule Take 2 mg by mouth at bedtime.  ? umeclidinium-vilanterol (ANORO ELLIPTA) 62.5-25 MCG/INH AEPB INHALE 1 PUFF BY MOUTH EVERY DAY  ? ?No facility-administered medications prior to visit.  ? ? ? ? ? ?  Objective  ?  ?BP 125/60 (BP Location: Right Arm, Patient Position: Sitting, Cuff Size: Normal)   Pulse 67   Temp (!) 97.5 ?F (36.4 ?C) (Oral)   Resp 14   Wt 230 lb 4.8 oz (104.5 kg)   SpO2 96%    BMI 30.38 kg/m?  ? ? ?Physical Exam  ? ?General: Appearance:    Mildly obese male in no acute distress  ?Eyes:    PERRL, conjunctiva/corneas clear, EOM's intact       ?Lungs:     Clear to auscultation bilaterally, respirations unlabored  ?Abd:   Small non-tender umbilical hernia.   ?Heart:    Normal heart rate. Normal rhythm. No murmurs, rubs, or gallops.    ?MS:   All extremities are intact.    ?Neurologic:   Awake, alert, oriented x 3. No apparent focal neurological defect.   ?   ?  ? Assessment & Plan  ?  ? ?1. Umbilical hernia without obstruction and without gangrene ?Reassurance. [place] currently causing any problems.  ? ?2. Dyspnea on exertion ? ?- DG Chest 2 View; Future ?- CBC ?- Brain natriuretic peptide ? ?Likely related to discontinuation of anoro that was prescribed for COPD.  ? ?3. Chronic obstructive pulmonary disease, unspecified COPD type ([place]) ?He has Anoro inhaler at home and is going to start back on it.  ?   ? ?The entirety of the information documented in the History of Present Illness, Review of Systems and Physical Exam were personally obtained by me. Portions of this information were initially documented by the CMA and reviewed by me for thoroughness and accuracy.   ? ? ?  Lelon Huh, MD  ?[facility] ?[phone] (phone) ?[phone] (fax) ? ?[facility]  ?

## 2021-08-14 LAB — CBC
Hematocrit: 46.7 % (ref 37.5–51.0)
Hemoglobin: 16.2 g/dL (ref 13.0–17.7)
MCH: 32.9 pg (ref 26.6–33.0)
MCHC: 34.7 g/dL (ref 31.5–35.7)
MCV: 95 fL (ref 79–97)
Platelets: 175 10*3/uL (ref 150–450)
RBC: 4.93 x10E6/uL (ref 4.14–5.80)
RDW: 12.8 % (ref 11.6–15.4)
WBC: 6.9 10*3/uL (ref 3.4–10.8)

## 2021-08-14 LAB — BRAIN NATRIURETIC PEPTIDE: BNP: 20.4 pg/mL (ref 0.0–100.0)

## 2021-08-15 DIAGNOSIS — Z6841 Body Mass Index (BMI) 40.0 and over, adult: Secondary | ICD-10-CM | POA: Diagnosis not present

## 2021-08-15 DIAGNOSIS — C61 Malignant neoplasm of prostate: Secondary | ICD-10-CM | POA: Diagnosis not present

## 2021-08-16 DIAGNOSIS — J449 Chronic obstructive pulmonary disease, unspecified: Secondary | ICD-10-CM | POA: Insufficient documentation

## 2021-08-19 DIAGNOSIS — M25561 Pain in right knee: Secondary | ICD-10-CM | POA: Diagnosis not present

## 2021-08-22 DIAGNOSIS — Z20822 Contact with and (suspected) exposure to covid-19: Secondary | ICD-10-CM | POA: Diagnosis not present

## 2021-08-26 DIAGNOSIS — Z20822 Contact with and (suspected) exposure to covid-19: Secondary | ICD-10-CM | POA: Diagnosis not present

## 2021-08-27 DIAGNOSIS — H02883 Meibomian gland dysfunction of right eye, unspecified eyelid: Secondary | ICD-10-CM | POA: Diagnosis not present

## 2021-08-27 DIAGNOSIS — H04123 Dry eye syndrome of bilateral lacrimal glands: Secondary | ICD-10-CM | POA: Diagnosis not present

## 2021-08-27 DIAGNOSIS — L718 Other rosacea: Secondary | ICD-10-CM | POA: Diagnosis not present

## 2021-08-27 DIAGNOSIS — H02886 Meibomian gland dysfunction of left eye, unspecified eyelid: Secondary | ICD-10-CM | POA: Diagnosis not present

## 2021-09-02 ENCOUNTER — Other Ambulatory Visit: Payer: Self-pay | Admitting: Family Medicine

## 2021-09-02 DIAGNOSIS — G4700 Insomnia, unspecified: Secondary | ICD-10-CM

## 2021-09-03 ENCOUNTER — Telehealth: Payer: Self-pay | Admitting: Family Medicine

## 2021-09-03 NOTE — Telephone Encounter (Signed)
Copied from [place] [id number]. Topic: Medicare AWV ?>> Sep 03, 2021 11:43 AM Cher Nakai R wrote: ?Reason for CRM:  ?Tried contacting patient to schedule Medicare Annual Wellness Visit (AWV) in office, but got disconnected.  ? ?If unable to come into the office for AWV,  please offer to do virtually or by telephone. ? ?Last AWV: 02/23/2017 ? ?Please schedule at anytime with [facility] Health Advisor. ? ?30 minute appointment for Virtual or phone ?45 minute appointment for in office or Initial virtual/phone ? ?Any questions, please contact me at [phone] ?

## 2021-09-16 DIAGNOSIS — Z20822 Contact with and (suspected) exposure to covid-19: Secondary | ICD-10-CM | POA: Diagnosis not present

## 2021-11-17 DIAGNOSIS — M1711 Unilateral primary osteoarthritis, right knee: Secondary | ICD-10-CM | POA: Insufficient documentation

## 2021-11-17 DIAGNOSIS — M722 Plantar fascial fibromatosis: Secondary | ICD-10-CM | POA: Diagnosis not present

## 2021-12-02 ENCOUNTER — Other Ambulatory Visit: Payer: Self-pay | Admitting: Family Medicine

## 2021-12-15 ENCOUNTER — Ambulatory Visit (INDEPENDENT_AMBULATORY_CARE_PROVIDER_SITE_OTHER): Payer: Medicare Other

## 2021-12-15 VITALS — Wt 230.0 lb

## 2021-12-15 DIAGNOSIS — Z0000 Encounter for general adult medical examination without abnormal findings: Secondary | ICD-10-CM

## 2021-12-15 NOTE — Progress Notes (Signed)
Virtual Visit via Telephone Note  I connected with  [name] on 12/15/21 at 10:00 AM EDT by telephone and verified that I am speaking with the correct person using two identifiers.  Location: Patient: home Provider: [facility] Persons participating in the virtual visit: [place]   I discussed the limitations, risks, security and privacy concerns of performing an evaluation and management service by telephone and the availability of in person appointments. The patient expressed understanding and agreed to proceed.  Interactive audio and video telecommunications were attempted between this nurse and patient, however failed, due to patient having technical difficulties OR patient did not have access to video capability.  We continued and completed visit with audio only.  Some vital signs may be absent or patient reported.   Christopher Cheo, LPN  Subjective:   [name] is a 76 y.o. male who presents for Medicare Annual/Subsequent preventive examination.  Review of Systems           Objective:    There were no vitals filed for this visit. There is no height or weight on file to calculate BMI.      No data to display          Current Medications (verified) Outpatient Encounter Medications as of 12/15/2021  Medication Sig   albuterol (VENTOLIN HFA) 108 (90 Base) MCG/ACT inhaler TAKE 2 PUFFS BY MOUTH EVERY 6 HOURS AS NEEDED FOR WHEEZE OR SHORTNESS OF BREATH   ALPRAZolam (XANAX) 1 MG tablet TAKE 1/2 TO 1 TABLET BY MOUTH AT BEDTIME   ALPRAZolam (XANAX) 1 MG tablet alprazolam 1 mg tablet   ANORO ELLIPTA 62.5-25 MCG/ACT AEPB INHALE 1 PUFF BY MOUTH EVERY DAY   CEQUA 0.09 % SOLN Apply 1 drop to eye 2 (two) times daily.   cycloSPORINE, PF, (CEQUA) 0.09 % SOLN Apply to eye.   diphenhydramine-acetaminophen (TYLENOL PM) 25-500 MG TABS tablet Take 1 tablet by mouth at bedtime as needed.   doxycycline (VIBRAMYCIN) 50 MG capsule TAKE 1 CAPSULE BY MOUTH EVERY DAY    finasteride (PROSCAR) 5 MG tablet Take 5 mg by mouth daily.   meloxicam (MOBIC) 15 MG tablet Take 1 tablet every day by oral route.   meloxicam (MOBIC) 7.5 MG tablet Take 1 tablet every day by oral route.   metroNIDAZOLE (METROGEL) 1 % gel Apply topically.   Naproxen Sodium (ALEVE) 220 MG CAPS Aleve   tamsulosin (FLOMAX) 0.4 MG CAPS capsule Take by mouth.   terazosin (HYTRIN) 2 MG capsule Take 2 mg by mouth at bedtime.   [DISCONTINUED] doxycycline (VIBRAMYCIN) 50 MG capsule    [DISCONTINUED] finasteride (PROSCAR) 5 MG tablet Take by mouth.   azithromycin (ZITHROMAX) 250 MG tablet azithromycin 250 mg tablet (Patient not taking: Reported on 12/15/2021)   benzonatate (TESSALON) 200 MG capsule benzonatate 200 mg capsule  TAKE 1 CAPSULE BY MOUTH THREE TIMES A DAY (Patient not taking: Reported on 12/15/2021)   cephALEXin (KEFLEX) 500 MG capsule cephalexin 500 mg capsule (Patient not taking: Reported on 12/15/2021)   cycloSPORINE (RESTASIS) 0.05 % ophthalmic emulsion Restasis 0.05 % eye drops in a dropperette (Patient not taking: Reported on 12/15/2021)   diazepam (VALIUM) 10 MG tablet diazepam 10 mg tablet  TAKE 1 TABLET 1 HOUR BEFORE MRI 1 IMMEDIATELY PRIOR OF NEEDED (Patient not taking: Reported on 12/15/2021)   gabapentin (NEURONTIN) 300 MG capsule gabapentin 300 mg capsule  Take 1 capsule 3 times a day by oral route. (Patient not taking: Reported on 12/15/2021)  HYDROcodone-acetaminophen (NORCO/VICODIN) 5-325 MG tablet hydrocodone 5 mg-acetaminophen 325 mg tablet (Patient not taking: Reported on 12/15/2021)   levofloxacin (LEVAQUIN) 500 MG tablet levofloxacin 500 mg tablet (Patient not taking: Reported on 12/15/2021)   LORazepam (ATIVAN) 0.5 MG tablet Ativan 0.5 mg tablet  Take 1 tablet 45 minutes prior to procedure. May repeat after 30 minutes prn.MUST BRING A DRIVER FOR PROCEDURE. (Patient not taking: Reported on 12/15/2021)   methocarbamol (ROBAXIN) 500 MG tablet Take by mouth. (Patient not taking: Reported  on 12/15/2021)   prednisoLONE acetate (PRED FORTE) 1 % ophthalmic suspension prednisolone acetate 1 % eye drops,suspension  PLEASE SEE ATTACHED FOR DETAILED DIRECTIONS (Patient not taking: Reported on 12/15/2021)   prednisoLONE acetate (PRED MILD) 0.12 % ophthalmic suspension Apply to eye. (Patient not taking: Reported on 12/15/2021)   predniSONE (DELTASONE) 10 MG tablet prednisone 10 mg tablet (Patient not taking: Reported on 12/15/2021)   pregabalin (LYRICA) 75 MG capsule Take by mouth. (Patient not taking: Reported on 12/15/2021)   sulfamethoxazole-trimethoprim (BACTRIM DS) 800-160 MG tablet sulfamethoxazole 800 mg-trimethoprim 160 mg tablet (Patient not taking: Reported on 12/15/2021)   tiZANidine (ZANAFLEX) 4 MG tablet tizanidine 4 mg tablet  Take 1 tablet every 6 hours by oral route. (Patient not taking: Reported on 12/15/2021)   traMADol (ULTRAM) 50 MG tablet tramadol 50 mg tablet  TAKE 1 TABLET BY MOUTH EVERY 6 HOURS AS NEEDED FOR PAIN (Patient not taking: Reported on 12/15/2021)   zolpidem (AMBIEN) 10 MG tablet zolpidem 10 mg tablet (Patient not taking: Reported on 12/15/2021)   No facility-administered encounter medications on file as of 12/15/2021.    Allergies (verified) Penicillins   History: Past Medical History:  Diagnosis Date   Abnormal prostate specific antigen 01/23/2015   Acute bacterial sinusitis 01/23/2015   Basal cell carcinoma 03/18/2020   right cheek, excised 06/04/20   Cannot sleep 01/23/2015   Disease caused by virus 01/23/2015   Diverticulitis 01/23/2015   Diverticulosis    History of tobacco use 07/26/2009   PNA (pneumonia) 01/23/2015   Severe obstructive sleep apnea 01/23/2015   Past Surgical History:  Procedure Laterality Date   APPENDECTOMY     LEG SURGERY Right    Family History  Problem Relation Age of Onset   Prostate cancer Father    Heart disease Father        MI in his 54s, but died at age 28   Social History   Socioeconomic History   Marital status: Married     Spouse name: Not on file   Number of children: Not on file   Years of education: Not on file   Highest education level: Not on file  Occupational History   Not on file  Tobacco Use   Smoking status: Former    Packs/day: 0.75    Years: 56.00    Total pack years: 42.00    Types: Cigarettes   Smokeless tobacco: Never   Tobacco comments:    Started smoking age 37, usually about 3/4 ppd Quit in 2022  Substance and Sexual Activity   Alcohol use: Yes    Alcohol/week: 2.0 standard drinks of alcohol    Types: 2 Glasses of wine per week   Drug use: No   Sexual activity: Not on file  Other Topics Concern   Not on file  Social History Narrative   Not on file   Social Determinants of Health   Financial Resource Strain: Not on file  Food Insecurity: Not on file  Transportation Needs: Not  on file  Physical Activity: Insufficiently Active (12/15/2021)   Exercise Vital Sign    Days of Exercise per Week: 2 days    Minutes of Exercise per Session: 20 min  Stress: No Stress Concern Present (12/15/2021)   [place]    Feeling of Stress : Not at all  Social Connections: Not on file    Tobacco Counseling Counseling given: Not Answered Tobacco comments: Started smoking age 54, usually about 3/4 ppd Quit in 2022   Clinical Intake:  Pre-visit preparation completed: Yes  Pain : No/denies pain     Nutritional Risks: None Diabetes: No  How often do you need to have someone help you when you read instructions, pamphlets, or other written materials from your doctor or pharmacy?: 1 - Never  Diabetic?no  Interpreter Needed?: No  Information entered by :: Kirke Shaggy, LPN   Activities of Daily Living     No data to display          Patient Care Team: Birdie Sons, MD as PCP - General (Family Medicine) Thomes Lolling, MD as Referring Physician (Urology)  Indicate any recent Medical Services you may  have received from other than [facility] providers in the past year (date may be approximate).     Assessment:   This is a routine wellness examination for [place].  Hearing/Vision screen No results found.  Dietary issues and exercise activities discussed:     Goals Addressed             This Visit's Progress    DIET - EAT MORE FRUITS AND VEGETABLES         Depression Screen    12/15/2021   10:09 AM 08/14/2020   11:11 AM 06/06/2020    2:40 PM 04/05/2018    9:26 AM 02/23/2017    3:20 PM  PHQ 2/9 Scores  PHQ - 2 Score 0 0 0 0 0  PHQ- 9 Score 0 4 0  6    Fall Risk    08/14/2020   11:11 AM 06/06/2020    2:41 PM 04/05/2018    9:26 AM 02/23/2017    3:20 PM 12/01/2016   11:00 AM  Fall Risk   Falls in the past year? 0 0 0 No No  Comment     Emmi Telephone Survey: data to providers prior to load  Number falls in past yr: 0 0     Injury with Fall? 0 0     Follow up  Falls evaluation completed       FALL RISK PREVENTION PERTAINING TO THE HOME:  Any stairs in or around the home? Yes  If so, are there any without handrails? No  Home free of loose throw rugs in walkways, pet beds, electrical cords, etc? Yes  Adequate lighting in your home to reduce risk of falls? Yes   ASSISTIVE DEVICES UTILIZED TO PREVENT FALLS:  Life alert? No  Use of a cane, walker or w/c? No  Grab bars in the bathroom? No  Shower chair or bench in shower? No  Elevated toilet seat or a handicapped toilet? No    Cognitive Function:declined 2023        Immunizations Immunization History  Administered Date(s) Administered   Influenza, High Dose Seasonal PF 06/05/2016, 02/23/2017, 02/08/2020, 02/19/2021   Influenza, Seasonal, Injecte, Preservative Fre 02/18/2018   Moderna Covid-19 Vaccine Bivalent Booster 79yr & up 02/19/2021   Moderna Sars-Covid-2 Vaccination 04/11/2020   Pneumococcal  Conjugate-13 09/18/2014   Pneumococcal Polysaccharide-23 02/23/2017    TDAP status: Due, Education has been  provided regarding the importance of this vaccine. Advised may receive this vaccine at local pharmacy or Health Dept. Aware to provide a copy of the vaccination record if obtained from local pharmacy or Health Dept. Verbalized acceptance and understanding.  Flu Vaccine status: Up to date  Pneumococcal vaccine status: Up to date  Covid-19 vaccine status: Completed vaccines  Qualifies for Shingles Vaccine? Yes   Zostavax completed No   Shingrix Completed?: No.    Education has been provided regarding the importance of this vaccine. Patient has been advised to call insurance company to determine out of pocket expense if they have not yet received this vaccine. Advised may also receive vaccine at local pharmacy or Health Dept. Verbalized acceptance and understanding.  Screening Tests Health Maintenance  Topic Date Due   Hepatitis C Screening  Never done   TETANUS/TDAP  Never done   Zoster Vaccines- Shingrix (1 of 2) Never done   COVID-19 Vaccine (3 - Moderna risk series) 03/19/2021   INFLUENZA VACCINE  12/09/2021   Pneumonia Vaccine 85+ Years old  Completed   HPV VACCINES  Aged Out   COLONOSCOPY (Pts 45-41yr Insurance coverage will need to be confirmed)  Discontinued    Health Maintenance  Health Maintenance Due  Topic Date Due   Hepatitis C Screening  Never done   TETANUS/TDAP  Never done   Zoster Vaccines- Shingrix (1 of 2) Never done   COVID-19 Vaccine (3 - Moderna risk series) 03/19/2021   INFLUENZA VACCINE  12/09/2021    Colorectal cancer screening: No longer required.   Lung Cancer Screening: (Low Dose CT Chest recommended if Age 77108-80years, 30 pack-year currently smoking OR have quit w/in 15years.) does not qualify.   Additional Screening:  Hepatitis C Screening: does qualify; Completed no  Vision Screening: Recommended annual ophthalmology exams for early detection of glaucoma and other disorders of the eye. Is the patient up to date with their annual eye exam?  Yes   Who is the provider or what is the name of the office in which the patient attends annual eye exams[facility] not [facility] with a provider, would they like to be referred to a provider to establish care? No .   Dental Screening: Recommended annual dental exams for proper oral hygiene  Community Resource Referral / Chronic Care Management: CRR required this visit?  No   CCM required this visit?  No      Plan:     I have personally reviewed and noted the following in the patient's chart:   Medical and social history Use of alcohol, tobacco or illicit drugs  Current medications and supplements including opioid prescriptions. Patient is currently taking opioid prescriptions. Information provided to patient regarding non-opioid alternatives. Patient advised to discuss non-opioid treatment plan with their provider. Functional ability and status Nutritional status Physical activity Advanced directives List of other physicians Hospitalizations, surgeries, and ER visits in previous 12 months Vitals Screenings to include cognitive, depression, and falls Referrals and appointments  In addition, I have reviewed and discussed with patient certain preventive protocols, quality metrics, and best practice recommendations. A written personalized care plan for preventive services as well as general preventive health recommendations were provided to patient.     LDionisio Cartez LPN   [id number]  Nurse Notes: none

## 2021-12-15 NOTE — Patient Instructions (Signed)
[name] , Thank you for taking time to come for your Medicare Wellness Visit. I appreciate your ongoing commitment to your health goals. Please review the following plan we discussed and let me know if I can assist you in the future.   Screening recommendations/referrals: Colonoscopy: aged out Recommended yearly ophthalmology/optometry visit for glaucoma screening and checkup Recommended yearly dental visit for hygiene and checkup  Vaccinations: Influenza vaccine: 02/19/21 Pneumococcal vaccine: 02/23/17 Tdap vaccine: n/d Shingles vaccine: n/d   Covid-19: 04/11/20, 02/19/21  Advanced directives: no  Conditions/risks identified: none  Next appointment: Follow up in one year for your annual wellness visit. 12/17/22 @ 9:30 am by phone  Preventive Care 65 Years and Older, Male Preventive care refers to lifestyle choices and visits with your health care provider that can promote health and wellness. What does preventive care include? A yearly physical exam. This is also called an annual well check. Dental exams once or twice a year. Routine eye exams. Ask your health care provider how often you should have your eyes checked. Personal lifestyle choices, including: Daily care of your teeth and gums. Regular physical activity. Eating a healthy diet. Avoiding tobacco and drug use. Limiting alcohol use. Practicing safe sex. Taking low doses of aspirin every day. Taking vitamin and mineral supplements as recommended by your health care provider. What happens during an annual well check? The services and screenings done by your health care provider during your annual well check will depend on your age, overall health, lifestyle risk factors, and family history of disease. Counseling  Your health care provider may ask you questions about your: Alcohol use. Tobacco use. Drug use. Emotional well-being. Home and relationship well-being. Sexual activity. Eating habits. History of  falls. Memory and ability to understand (cognition). Work and work Statistician. Screening  You may have the following tests or measurements: Height, weight, and BMI. Blood pressure. Lipid and cholesterol levels. These may be checked every 5 years, or more frequently if you are over 77 years old. Skin check. Lung cancer screening. You may have this screening every year starting at age 77 if you have a 30-pack-year history of smoking and currently smoke or have quit within the past 15 years. Fecal occult blood test (FOBT) of the stool. You may have this test every year starting at age 77. Flexible sigmoidoscopy or colonoscopy. You may have a sigmoidoscopy every 5 years or a colonoscopy every 10 years starting at age 77. Prostate cancer screening. Recommendations will vary depending on your family history and other risks. Hepatitis C blood test. Hepatitis B blood test. Sexually transmitted disease (STD) testing. Diabetes screening. This is done by checking your blood sugar (glucose) after you have not eaten for a while (fasting). You may have this done every 1-3 years. Abdominal aortic aneurysm (AAA) screening. You may need this if you are a current or former smoker. Osteoporosis. You may be screened starting at age 77 if you are at high risk. Talk with your health care provider about your test results, treatment options, and if necessary, the need for more tests. Vaccines  Your health care provider may recommend certain vaccines, such as: Influenza vaccine. This is recommended every year. Tetanus, diphtheria, and acellular pertussis (Tdap, Td) vaccine. You may need a Td booster every 10 years. Zoster vaccine. You may need this after age 77. Pneumococcal 13-valent conjugate (PCV13) vaccine. One dose is recommended after age 77. Pneumococcal polysaccharide (PPSV23) vaccine. One dose is recommended after age 77. Talk to your health care  provider about which screenings and vaccines you need and  how often you need them. This information is not intended to replace advice given to you by your health care provider. Make sure you discuss any questions you have with your health care provider. Document Released: 05/24/2015 Document Revised: 01/15/2016 Document Reviewed: 02/26/2015 Elsevier Interactive Patient Education  2017 [place] Prevention in the Home Falls can cause injuries. They can happen to people of all ages. There are many things you can do to make your home safe and to help prevent falls. What can I do on the outside of my home? Regularly fix the edges of walkways and driveways and fix any cracks. Remove anything that might make you trip as you walk through a door, such as a raised step or threshold. Trim any bushes or trees on the path to your home. Use bright outdoor lighting. Clear any walking paths of anything that might make someone trip, such as rocks or tools. Regularly check to see if handrails are loose or broken. Make sure that both sides of any steps have handrails. Any raised decks and porches should have guardrails on the edges. Have any leaves, snow, or ice cleared regularly. Use sand or salt on walking paths during winter. Clean up any spills in your garage right away. This includes oil or grease spills. What can I do in the bathroom? Use night lights. Install grab bars by the toilet and in the tub and shower. Do not use towel bars as grab bars. Use non-skid mats or decals in the tub or shower. If you need to sit down in the shower, use a plastic, non-slip stool. Keep the floor dry. Clean up any water that spills on the floor as soon as it happens. Remove soap buildup in the tub or shower regularly. Attach bath mats securely with double-sided non-slip rug tape. Do not have throw rugs and other things on the floor that can make you trip. What can I do in the bedroom? Use night lights. Make sure that you have a light by your bed that is easy to  reach. Do not use any sheets or blankets that are too big for your bed. They should not hang down onto the floor. Have a firm chair that has side arms. You can use this for support while you get dressed. Do not have throw rugs and other things on the floor that can make you trip. What can I do in the kitchen? Clean up any spills right away. Avoid walking on wet floors. Keep items that you use a lot in easy-to-reach places. If you need to reach something above you, use a strong step stool that has a grab bar. Keep electrical cords out of the way. Do not use floor polish or wax that makes floors slippery. If you must use wax, use non-skid floor wax. Do not have throw rugs and other things on the floor that can make you trip. What can I do with my stairs? Do not leave any items on the stairs. Make sure that there are handrails on both sides of the stairs and use them. Fix handrails that are broken or loose. Make sure that handrails are as long as the stairways. Check any carpeting to make sure that it is firmly attached to the stairs. Fix any carpet that is loose or worn. Avoid having throw rugs at the top or bottom of the stairs. If you do have throw rugs, attach them to the  floor with carpet tape. Make sure that you have a light switch at the top of the stairs and the bottom of the stairs. If you do not have them, ask someone to add them for you. What else can I do to help prevent falls? Wear shoes that: Do not have high heels. Have rubber bottoms. Are comfortable and fit you well. Are closed at the toe. Do not wear sandals. If you use a stepladder: Make sure that it is fully opened. Do not climb a closed stepladder. Make sure that both sides of the stepladder are locked into place. Ask someone to hold it for you, if possible. Clearly mark and make sure that you can see: Any grab bars or handrails. First and last steps. Where the edge of each step is. Use tools that help you move  around (mobility aids) if they are needed. These include: Canes. Walkers. Scooters. Crutches. Turn on the lights when you go into a dark area. Replace any light bulbs as soon as they burn out. Set up your furniture so you have a clear path. Avoid moving your furniture around. If any of your floors are uneven, fix them. If there are any pets around you, be aware of where they are. Review your medicines with your doctor. Some medicines can make you feel dizzy. This can increase your chance of falling. Ask your doctor what other things that you can do to help prevent falls. This information is not intended to replace advice given to you by your health care provider. Make sure you discuss any questions you have with your health care provider. Document Released: 02/21/2009 Document Revised: 10/03/2015 Document Reviewed: 06/01/2014 Elsevier Interactive Patient Education  2017 [employer].

## 2021-12-26 DIAGNOSIS — R0602 Shortness of breath: Secondary | ICD-10-CM | POA: Diagnosis not present

## 2021-12-26 DIAGNOSIS — G4733 Obstructive sleep apnea (adult) (pediatric): Secondary | ICD-10-CM | POA: Diagnosis not present

## 2021-12-27 DIAGNOSIS — G4733 Obstructive sleep apnea (adult) (pediatric): Secondary | ICD-10-CM | POA: Diagnosis not present

## 2021-12-27 DIAGNOSIS — R0602 Shortness of breath: Secondary | ICD-10-CM | POA: Diagnosis not present

## 2022-01-06 DIAGNOSIS — C61 Malignant neoplasm of prostate: Secondary | ICD-10-CM | POA: Diagnosis not present

## 2022-01-07 DIAGNOSIS — M545 Low back pain, unspecified: Secondary | ICD-10-CM | POA: Diagnosis not present

## 2022-01-07 DIAGNOSIS — L89529 Pressure ulcer of left ankle, unspecified stage: Secondary | ICD-10-CM | POA: Diagnosis not present

## 2022-01-07 DIAGNOSIS — M1711 Unilateral primary osteoarthritis, right knee: Secondary | ICD-10-CM | POA: Diagnosis not present

## 2022-01-09 ENCOUNTER — Other Ambulatory Visit: Payer: Self-pay | Admitting: Family Medicine

## 2022-01-09 DIAGNOSIS — M5416 Radiculopathy, lumbar region: Secondary | ICD-10-CM | POA: Diagnosis not present

## 2022-01-09 DIAGNOSIS — M25561 Pain in right knee: Secondary | ICD-10-CM | POA: Diagnosis not present

## 2022-01-14 DIAGNOSIS — M1711 Unilateral primary osteoarthritis, right knee: Secondary | ICD-10-CM | POA: Diagnosis not present

## 2022-01-21 DIAGNOSIS — M1711 Unilateral primary osteoarthritis, right knee: Secondary | ICD-10-CM | POA: Diagnosis not present

## 2022-02-11 ENCOUNTER — Other Ambulatory Visit: Payer: Self-pay | Admitting: Family Medicine

## 2022-02-11 DIAGNOSIS — G4700 Insomnia, unspecified: Secondary | ICD-10-CM

## 2022-02-22 DIAGNOSIS — I7025 Atherosclerosis of native arteries of other extremities with ulceration: Secondary | ICD-10-CM | POA: Insufficient documentation

## 2022-02-22 DIAGNOSIS — I739 Peripheral vascular disease, unspecified: Secondary | ICD-10-CM | POA: Insufficient documentation

## 2022-02-22 NOTE — Progress Notes (Unsigned)
MRN : [id number]  [name] is a 76 y.o. (12/27/1945) male who presents with chief complaint of check circulation.  History of Present Illness:   The patient is seen for evaluation of painful lower extremities. Patient notes the pain is variable and not always associated with activity.  The pain is somewhat consistent day to day occurring on most days. The patient notes the pain also occurs with standing and routinely seems worse as the day wears on. The pain has been progressive over the past several years. The patient states these symptoms are causing  a negative impact on quality of life and daily activities which was a factor in the referral.  The patient has a  history of back problems and DJD of the lumbar and sacral spine.   The patient denies rest pain or dangling of an extremity off the side of the bed during the night for relief. No open wounds or sores at this time. No history of DVT or phlebitis. No prior vascular interventions or surgeries.    No outpatient medications have been marked as taking for the 02/23/22 encounter (Appointment) with Delana Meyer, Dolores Lory, MD.    Past Medical History:  Diagnosis Date   Abnormal prostate specific antigen 01/23/2015   Acute bacterial sinusitis 01/23/2015   Basal cell carcinoma 03/18/2020   right cheek, excised 06/04/20   Cannot sleep 01/23/2015   Disease caused by virus 01/23/2015   Diverticulitis 01/23/2015   Diverticulosis    History of tobacco use 07/26/2009   PNA (pneumonia) 01/23/2015   Severe obstructive sleep apnea 01/23/2015    Past Surgical History:  Procedure Laterality Date   APPENDECTOMY     LEG SURGERY Right     Social History Social History   Tobacco Use   Smoking status: Former    Packs/day: 0.75    Years: 56.00    Total pack years: 42.00    Types: Cigarettes   Smokeless tobacco: Never   Tobacco comments:    Started smoking age 40, usually about 3/4 ppd Quit in 2022  Substance Use  Topics   Alcohol use: Yes    Alcohol/week: 2.0 standard drinks of alcohol    Types: 2 Glasses of wine per week   Drug use: No    Family History Family History  Problem Relation Age of Onset   Prostate cancer Father    Heart disease Father        MI in his 68s, but died at age 91    Allergies  Allergen Reactions   Penicillins      REVIEW OF SYSTEMS (Negative unless checked)  Constitutional: '[]'$ Weight loss  '[]'$ Fever  '[]'$ Chills Cardiac: '[]'$ Chest pain   '[]'$ Chest pressure   '[]'$ Palpitations   '[]'$ Shortness of breath when laying flat   '[]'$ Shortness of breath with exertion. Vascular:  '[x]'$ Pain in legs with walking   '[]'$ Pain in legs at rest  '[]'$ History of DVT   '[]'$ Phlebitis   '[]'$ Swelling in legs   '[]'$ Varicose veins   '[]'$ Non-healing ulcers Pulmonary:   '[]'$ Uses home oxygen   '[]'$ Productive cough   '[]'$ Hemoptysis   '[]'$ Wheeze  '[]'$ COPD   '[]'$ Asthma Neurologic:  '[]'$ Dizziness   '[]'$ Seizures   '[]'$ History of stroke   '[]'$ History of TIA  '[]'$ Aphasia   '[]'$ Vissual changes   '[]'$ Weakness or numbness in arm   '[]'$ Weakness or numbness in leg Musculoskeletal:   '[]'$ Joint swelling   '[]'$ Joint pain   '[]'$ Low back  pain Hematologic:  '[]'$ Easy bruising  '[]'$ Easy bleeding   '[]'$ Hypercoagulable state   '[]'$ Anemic Gastrointestinal:  '[]'$ Diarrhea   '[]'$ Vomiting  '[]'$ Gastroesophageal reflux/heartburn   '[]'$ Difficulty swallowing. Genitourinary:  '[]'$ Chronic kidney disease   '[]'$ Difficult urination  '[]'$ Frequent urination   '[]'$ Blood in urine Skin:  '[]'$ Rashes   '[]'$ Ulcers  Psychological:  '[]'$ History of anxiety   '[]'$  History of major depression.  Physical Examination  There were no vitals filed for this visit. There is no height or weight on file to calculate BMI. Gen: WD/WN, NAD Head: /AT, No temporalis wasting.  Ear/Nose/Throat: Hearing grossly intact, nares w/o erythema or drainage Eyes: PER, EOMI, sclera nonicteric.  Neck: Supple, no masses.  No bruit or JVD.  Pulmonary:  Good air movement, no audible wheezing, no use of accessory muscles.  Cardiac: RRR, normal S1, S2, no  Murmurs. Vascular:  mild trophic changes, no open wounds Vessel Right Left  Radial Palpable Palpable  PT Not Palpable Not Palpable  DP Not Palpable Not Palpable  Gastrointestinal: soft, non-distended. No guarding/no peritoneal signs.  Musculoskeletal: M/S 5/5 throughout.  No visible deformity.  Neurologic: CN 2-12 intact. Pain and light touch intact in extremities.  Symmetrical.  Speech is fluent. Motor exam as listed above. Psychiatric: Judgment intact, Mood & affect appropriate for pt's clinical situation. Dermatologic: No rashes or ulcers noted.  No changes consistent with cellulitis.   CBC Lab Results  Component Value Date   WBC 6.9 08/13/2021   HGB 16.2 08/13/2021   HCT 46.7 08/13/2021   MCV 95 08/13/2021   PLT 175 08/13/2021    BMET    Component Value Date/Time   NA 136 10/09/2017 1931   K 4.1 10/09/2017 1931   CL 101 10/09/2017 1931   CO2 25 10/09/2017 1931   GLUCOSE 158 (H) 10/09/2017 1931   BUN 16 10/09/2017 1931   CREATININE 1.11 10/09/2017 1931   CALCIUM 9.2 10/09/2017 1931   GFRNONAA >60 10/09/2017 1931   GFRAA >60 10/09/2017 1931   CrCl cannot be calculated (Patient's most recent lab result is older than the maximum 21 days allowed.).  COAG No results found for: "INR", "PROTIME"  Radiology No results found.   Assessment/Plan There are no diagnoses linked to this encounter.   [name], MD  02/22/2022 10:54 AM

## 2022-02-23 ENCOUNTER — Ambulatory Visit (INDEPENDENT_AMBULATORY_CARE_PROVIDER_SITE_OTHER): Payer: Medicare Other | Admitting: Vascular Surgery

## 2022-02-23 ENCOUNTER — Encounter (INDEPENDENT_AMBULATORY_CARE_PROVIDER_SITE_OTHER): Payer: Self-pay | Admitting: Vascular Surgery

## 2022-02-23 VITALS — BP 146/74 | HR 75 | Resp 16 | Wt 232.4 lb

## 2022-02-23 DIAGNOSIS — M5416 Radiculopathy, lumbar region: Secondary | ICD-10-CM | POA: Diagnosis not present

## 2022-02-23 DIAGNOSIS — I7025 Atherosclerosis of native arteries of other extremities with ulceration: Secondary | ICD-10-CM | POA: Diagnosis not present

## 2022-02-23 DIAGNOSIS — E782 Mixed hyperlipidemia: Secondary | ICD-10-CM

## 2022-02-23 DIAGNOSIS — M79606 Pain in leg, unspecified: Secondary | ICD-10-CM | POA: Diagnosis not present

## 2022-02-23 DIAGNOSIS — I739 Peripheral vascular disease, unspecified: Secondary | ICD-10-CM

## 2022-02-23 DIAGNOSIS — J449 Chronic obstructive pulmonary disease, unspecified: Secondary | ICD-10-CM | POA: Diagnosis not present

## 2022-02-25 ENCOUNTER — Encounter (INDEPENDENT_AMBULATORY_CARE_PROVIDER_SITE_OTHER): Payer: Self-pay | Admitting: Vascular Surgery

## 2022-02-25 DIAGNOSIS — M5451 Vertebrogenic low back pain: Secondary | ICD-10-CM | POA: Diagnosis not present

## 2022-02-25 DIAGNOSIS — M25561 Pain in right knee: Secondary | ICD-10-CM | POA: Diagnosis not present

## 2022-02-26 ENCOUNTER — Ambulatory Visit (INDEPENDENT_AMBULATORY_CARE_PROVIDER_SITE_OTHER): Payer: Medicare Other

## 2022-02-26 DIAGNOSIS — I7025 Atherosclerosis of native arteries of other extremities with ulceration: Secondary | ICD-10-CM

## 2022-03-02 ENCOUNTER — Ambulatory Visit (INDEPENDENT_AMBULATORY_CARE_PROVIDER_SITE_OTHER): Payer: Medicare Other | Admitting: Nurse Practitioner

## 2022-03-02 ENCOUNTER — Encounter (INDEPENDENT_AMBULATORY_CARE_PROVIDER_SITE_OTHER): Payer: Self-pay | Admitting: Nurse Practitioner

## 2022-03-02 VITALS — BP 133/81 | HR 69 | Resp 16 | Ht 73.0 in | Wt 228.0 lb

## 2022-03-02 DIAGNOSIS — M722 Plantar fascial fibromatosis: Secondary | ICD-10-CM

## 2022-03-02 DIAGNOSIS — I7025 Atherosclerosis of native arteries of other extremities with ulceration: Secondary | ICD-10-CM

## 2022-03-02 DIAGNOSIS — M25561 Pain in right knee: Secondary | ICD-10-CM | POA: Diagnosis not present

## 2022-03-02 DIAGNOSIS — E782 Mixed hyperlipidemia: Secondary | ICD-10-CM | POA: Diagnosis not present

## 2022-03-02 DIAGNOSIS — M5451 Vertebrogenic low back pain: Secondary | ICD-10-CM | POA: Diagnosis not present

## 2022-03-02 NOTE — H&P (View-Only) (Signed)
Subjective:    Patient ID: [name], male    DOB: [birth year], 76 y.o.   MRN: [id number] Chief Complaint  Patient presents with   Follow-up    ultrasound    The patient is seen for evaluation of painful lower extremities. Patient notes the pain is variable and not always associated with activity.  The pain is somewhat consistent day to day occurring on most days. The patient notes the pain also occurs with standing and routinely seems worse as the day wears on. The pain has been progressive over the past several months. It seems to be associated with a "callous" on the lateral malleolus.  This wound has been present since approximately May or so.  He denies infection.  The patient states these symptoms are causing  a negative impact on quality of life and daily activities which was a factor in the referral.   The patient has a  history of back problems and DJD of the lumbar and sacral spine.    The patient denies rest pain or dangling of an extremity off the side of the bed during the night for relief. No open wounds or sores at this time. No history of DVT or phlebitis. No prior vascular interventions or surgeries.  The patient has ABI of 1.15 on the right and 0.84 on the left.  Additional left lower extremity arterial duplex shows triphasic waveforms in the left lower extremity down to the level of the popliteal where it transitions to monophasic waveforms suggesting high-grade stenosis.    Review of Systems  Skin:  Positive for wound.  All other systems reviewed and are negative.      Objective:   Physical Exam Vitals reviewed.  HENT:     Head: Normocephalic.  Cardiovascular:     Rate and Rhythm: Normal rate.     Pulses:          Dorsalis pedis pulses are 2+ on the right side and detected w/ Doppler on the left side.       Posterior tibial pulses are 2+ on the right side and detected w/ Doppler on the left side.  Pulmonary:     Effort: Pulmonary effort is normal.   Skin:    General: Skin is warm and dry.  Neurological:     Mental Status: He is alert and oriented to person, place, and time.  Psychiatric:        Mood and Affect: Mood normal.        Behavior: Behavior normal.        Thought Content: Thought content normal.        Judgment: Judgment normal.     BP 133/81 (BP Location: Left Arm)   Pulse 69   Resp 16   Ht '6\' 1"'$  (1.854 m)   Wt 228 lb (103.4 kg)   BMI 30.08 kg/m   Past Medical History:  Diagnosis Date   Abnormal prostate specific antigen 01/23/2015   Acute bacterial sinusitis 01/23/2015   Basal cell carcinoma 03/18/2020   right cheek, excised 06/04/20   Cannot sleep 01/23/2015   Disease caused by virus 01/23/2015   Diverticulitis 01/23/2015   Diverticulosis    History of tobacco use 07/26/2009   PNA (pneumonia) 01/23/2015   Severe obstructive sleep apnea 01/23/2015    Social History   Socioeconomic History   Marital status: Married    Spouse name: Not on file   Number of children: Not on file   Years of education: Not  on file   Highest education level: Not on file  Occupational History   Not on file  Tobacco Use   Smoking status: Former    Packs/day: 0.75    Years: 56.00    Total pack years: 42.00    Types: Cigarettes   Smokeless tobacco: Never   Tobacco comments:    Started smoking age 76, usually about 3/4 ppd Quit in 2022  Substance and Sexual Activity   Alcohol use: Yes    Alcohol/week: 2.0 standard drinks of alcohol    Types: 2 Glasses of wine per week   Drug use: No   Sexual activity: Not on file  Other Topics Concern   Not on file  Social History Narrative   Not on file   Social Determinants of Health   Financial Resource Strain: Unknown (12/15/2021)   Overall Financial Resource Strain (CARDIA)    Difficulty of Paying Living Expenses: Patient refused  Food Insecurity: No Food Insecurity (12/15/2021)   Hunger Vital Sign    Worried About Running Out of Food in the Last Year: Never true    Ran Out of  Food in the Last Year: Never true  Transportation Needs: No Transportation Needs (12/15/2021)   PRAPARE - Hydrologist (Medical): No    Lack of Transportation (Non-Medical): No  Physical Activity: Insufficiently Active (12/15/2021)   Exercise Vital Sign    Days of Exercise per Week: 2 days    Minutes of Exercise per Session: 20 min  Stress: No Stress Concern Present (12/15/2021)   [place]    Feeling of Stress : Not at all  Social Connections: Socially Integrated (12/15/2021)   Social Connection and Isolation Panel [NHANES]    Frequency of Communication with Friends and Family: More than three times a week    Frequency of Social Gatherings with Friends and Family: Twice a week    Attends Religious Services: More than 4 times per year    Active Member of Clubs or Organizations: Yes    Attends Archivist Meetings: More than 4 times per year    Marital Status: Married  Human resources officer Violence: Not At Risk (12/15/2021)   Humiliation, Afraid, Rape, and Kick questionnaire    Fear of Current or Ex-Partner: No    Emotionally Abused: No    Physically Abused: No    Sexually Abused: No    Past Surgical History:  Procedure Laterality Date   APPENDECTOMY     LEG SURGERY Right     Family History  Problem Relation Age of Onset   Prostate cancer Father    Heart disease Father        MI in his 23s, but died at age 9    Allergies  Allergen Reactions   Penicillins        Latest Ref Rng & Units 08/13/2021   11:27 AM 10/09/2017    7:31 PM  CBC  WBC 3.4 - 10.8 x10E3/uL 6.9  10.8   Hemoglobin 13.0 - 17.7 g/dL 16.2  17.9   Hematocrit 37.5 - 51.0 % 46.7  52.3   Platelets 150 - 450 x10E3/uL 175  175       CMP     Component Value Date/Time   NA 136 10/09/2017 1931   K 4.1 10/09/2017 1931   CL 101 10/09/2017 1931   CO2 25 10/09/2017 1931   GLUCOSE 158 (H) 10/09/2017 1931   BUN 16  10/09/2017 1931   CREATININE 1.11 10/09/2017 1931   CALCIUM 9.2 10/09/2017 1931   GFRNONAA >60 10/09/2017 1931   GFRAA >60 10/09/2017 1931     VAS [place] ABI WITH/WO TBI  Result Date: 03/02/2022  LOWER EXTREMITY DOPPLER STUDY Patient Name:  KEIGO WHALLEY  Date of Exam:   02/26/2022 Medical Rec #: [id number]         Accession #:    [id number] Date of Birth: 1945-10-17        Patient Gender: M Patient Age:   41 years Exam Location:  [facility] Procedure:      VAS [place] ABI WITH/WO TBI Referring Phys: --------------------------------------------------------------------------------  Indications: Ulceration. left  Performing Technologist: Concha [place] RVT  Examination Guidelines: A complete evaluation includes at minimum, Doppler waveform signals and systolic blood pressure reading at the level of bilateral brachial, anterior tibial, and posterior tibial arteries, when vessel segments are accessible. Bilateral testing is considered an integral part of a complete examination. Photoelectric Plethysmograph (PPG) waveforms and toe systolic pressure readings are included as required and additional duplex testing as needed. Limited examinations for reoccurring indications may be performed as noted.  ABI Findings: +---------+------------------+-----+---------+--------+ Right    Rt Pressure (mmHg)IndexWaveform Comment  +---------+------------------+-----+---------+--------+ Brachial 128                                      +---------+------------------+-----+---------+--------+ ATA      136               1.06 triphasic         +---------+------------------+-----+---------+--------+ PTA      147               1.15 triphasic         +---------+------------------+-----+---------+--------+ Great Toe125               0.98 Normal            +---------+------------------+-----+---------+--------+ +---------+------------------+-----+----------+------------------+ Left     Lt Pressure  (mmHg)IndexWaveform  Comment            +---------+------------------+-----+----------+------------------+ ATA      107               0.84 monophasic                   +---------+------------------+-----+----------+------------------+ PTA      99                0.77 monophasic                   +---------+------------------+-----+----------+------------------+ PERO                            absent    occluded by duplex +---------+------------------+-----+----------+------------------+ Great Toe75                0.59 Normal                       +---------+------------------+-----+----------+------------------+  Summary: Right: Resting right ankle-brachial index is within normal range. The right toe-brachial index is normal. Left: Resting left ankle-brachial index indicates mild left lower extremity arterial disease. The left toe-brachial index is abnormal. *See table(s) above for measurements and observations.  Electronically signed by [name] MD on 03/02/2022 at 2:08:51 PM.    Final  Assessment & Plan:   1. Atherosclerosis of native arteries of the extremities with ulceration ([place]) I had an extensive the patient in regards to his ulceration and his study results.  We discussed extensively the procedure itself as well as the risk, benefits and alternatives.  The patient wishes to discuss this with his family.  He will reach out if he decides to move forward.  2. Hyperlipidemia, mixed No new medications added at this time  3. Plantar fasciitis of left foot The likely complications worsened patient left lower extremity pain.   Current Outpatient Medications on File Prior to Visit  Medication Sig Dispense Refill   albuterol (VENTOLIN HFA) 108 (90 Base) MCG/ACT inhaler TAKE 2 PUFFS BY MOUTH EVERY 6 HOURS AS NEEDED FOR WHEEZE OR SHORTNESS OF BREATH 8.5 each 0   ALPRAZolam (XANAX) 1 MG tablet TAKE 1/2-1 TABLET BY MOUTH AT BEDTIME 30 tablet 3   ANORO ELLIPTA  62.5-25 MCG/ACT AEPB INHALE 1 PUFF BY MOUTH EVERY DAY 60 each 12   doxycycline (VIBRAMYCIN) 50 MG capsule TAKE 1 CAPSULE BY MOUTH EVERY DAY 30 capsule 9   finasteride (PROSCAR) 5 MG tablet Take 5 mg by mouth daily.     metroNIDAZOLE (METROGEL) 1 % gel Apply topically.     Naproxen Sodium (ALEVE) 220 MG CAPS Aleve     tamsulosin (FLOMAX) 0.4 MG CAPS capsule Take by mouth.     ALPRAZolam (XANAX) 1 MG tablet alprazolam 1 mg tablet     azithromycin (ZITHROMAX) 250 MG tablet azithromycin 250 mg tablet (Patient not taking: Reported on 12/15/2021)     benzonatate (TESSALON) 200 MG capsule benzonatate 200 mg capsule  TAKE 1 CAPSULE BY MOUTH THREE TIMES A DAY (Patient not taking: Reported on 12/15/2021)     cephALEXin (KEFLEX) 500 MG capsule cephalexin 500 mg capsule (Patient not taking: Reported on 12/15/2021)     CEQUA 0.09 % SOLN Apply 1 drop to eye 2 (two) times daily.     cycloSPORINE (RESTASIS) 0.05 % ophthalmic emulsion Restasis 0.05 % eye drops in a dropperette (Patient not taking: Reported on 12/15/2021)     cycloSPORINE, PF, (CEQUA) 0.09 % SOLN Apply to eye.     diazepam (VALIUM) 10 MG tablet diazepam 10 mg tablet  TAKE 1 TABLET 1 HOUR BEFORE MRI 1 IMMEDIATELY PRIOR OF NEEDED (Patient not taking: Reported on 12/15/2021)     diphenhydramine-acetaminophen (TYLENOL PM) 25-500 MG TABS tablet Take 1 tablet by mouth at bedtime as needed. (Patient not taking: Reported on 03/02/2022)     gabapentin (NEURONTIN) 300 MG capsule gabapentin 300 mg capsule  Take 1 capsule 3 times a day by oral route. (Patient not taking: Reported on 12/15/2021)     HYDROcodone-acetaminophen (NORCO/VICODIN) 5-325 MG tablet hydrocodone 5 mg-acetaminophen 325 mg tablet (Patient not taking: Reported on 12/15/2021)     levofloxacin (LEVAQUIN) 500 MG tablet levofloxacin 500 mg tablet (Patient not taking: Reported on 12/15/2021)     LORazepam (ATIVAN) 0.5 MG tablet Ativan 0.5 mg tablet  Take 1 tablet 45 minutes prior to procedure. May repeat  after 30 minutes prn.MUST BRING A DRIVER FOR PROCEDURE. (Patient not taking: Reported on 12/15/2021)     meloxicam (MOBIC) 15 MG tablet Take 1 tablet every day by oral route. (Patient not taking: Reported on 02/23/2022)     meloxicam (MOBIC) 7.5 MG tablet Take 1 tablet every day by oral route. (Patient not taking: Reported on 02/23/2022)     methocarbamol (ROBAXIN) 500 MG tablet Take by mouth. (Patient not  taking: Reported on 12/15/2021)     prednisoLONE acetate (PRED FORTE) 1 % ophthalmic suspension prednisolone acetate 1 % eye drops,suspension  PLEASE SEE ATTACHED FOR DETAILED DIRECTIONS (Patient not taking: Reported on 12/15/2021)     prednisoLONE acetate (PRED MILD) 0.12 % ophthalmic suspension Apply to eye. (Patient not taking: Reported on 12/15/2021)     predniSONE (DELTASONE) 10 MG tablet prednisone 10 mg tablet (Patient not taking: Reported on 12/15/2021)     pregabalin (LYRICA) 75 MG capsule Take by mouth. (Patient not taking: Reported on 03/02/2022)     sulfamethoxazole-trimethoprim (BACTRIM DS) 800-160 MG tablet sulfamethoxazole 800 mg-trimethoprim 160 mg tablet (Patient not taking: Reported on 12/15/2021)     terazosin (HYTRIN) 2 MG capsule Take 2 mg by mouth at bedtime. (Patient not taking: Reported on 02/23/2022)     tiZANidine (ZANAFLEX) 4 MG tablet tizanidine 4 mg tablet  Take 1 tablet every 6 hours by oral route. (Patient not taking: Reported on 12/15/2021)     traMADol (ULTRAM) 50 MG tablet tramadol 50 mg tablet  TAKE 1 TABLET BY MOUTH EVERY 6 HOURS AS NEEDED FOR PAIN (Patient not taking: Reported on 12/15/2021)     zolpidem (AMBIEN) 10 MG tablet zolpidem 10 mg tablet (Patient not taking: Reported on 12/15/2021)     No current facility-administered medications on file prior to visit.    There are no Patient Instructions on file for this visit. No follow-ups on file.   Kris Hartmann, NP

## 2022-03-02 NOTE — Progress Notes (Signed)
Subjective:    Patient ID: [name], male    DOB: April 23, 1946, 76 y.o.   MRN: [id number] Chief Complaint  Patient presents with   Follow-up    ultrasound    The patient is seen for evaluation of painful lower extremities. Patient notes the pain is variable and not always associated with activity.  The pain is somewhat consistent day to day occurring on most days. The patient notes the pain also occurs with standing and routinely seems worse as the day wears on. The pain has been progressive over the past several months. It seems to be associated with a "callous" on the lateral malleolus.  This wound has been present since approximately May or so.  He denies infection.  The patient states these symptoms are causing  a negative impact on quality of life and daily activities which was a factor in the referral.   The patient has a  history of back problems and DJD of the lumbar and sacral spine.    The patient denies rest pain or dangling of an extremity off the side of the bed during the night for relief. No open wounds or sores at this time. No history of DVT or phlebitis. No prior vascular interventions or surgeries.  The patient has ABI of 1.15 on the right and 0.84 on the left.  Additional left lower extremity arterial duplex shows triphasic waveforms in the left lower extremity down to the level of the popliteal where it transitions to monophasic waveforms suggesting high-grade stenosis.    Review of Systems  Skin:  Positive for wound.  All other systems reviewed and are negative.      Objective:   Physical Exam Vitals reviewed.  HENT:     Head: Normocephalic.  Cardiovascular:     Rate and Rhythm: Normal rate.     Pulses:          Dorsalis pedis pulses are 2+ on the right side and detected w/ Doppler on the left side.       Posterior tibial pulses are 2+ on the right side and detected w/ Doppler on the left side.  Pulmonary:     Effort: Pulmonary effort is normal.   Skin:    General: Skin is warm and dry.  Neurological:     Mental Status: He is alert and oriented to person, place, and time.  Psychiatric:        Mood and Affect: Mood normal.        Behavior: Behavior normal.        Thought Content: Thought content normal.        Judgment: Judgment normal.     BP 133/81 (BP Location: Left Arm)   Pulse 69   Resp 16   Ht '6\' 1"'$  (1.854 m)   Wt 228 lb (103.4 kg)   BMI 30.08 kg/m   Past Medical History:  Diagnosis Date   Abnormal prostate specific antigen 01/23/2015   Acute bacterial sinusitis 01/23/2015   Basal cell carcinoma 03/18/2020   right cheek, excised 06/04/20   Cannot sleep 01/23/2015   Disease caused by virus 01/23/2015   Diverticulitis 01/23/2015   Diverticulosis    History of tobacco use 07/26/2009   PNA (pneumonia) 01/23/2015   Severe obstructive sleep apnea 01/23/2015    Social History   Socioeconomic History   Marital status: Married    Spouse name: Not on file   Number of children: Not on file   Years of education: Not  on file   Highest education level: Not on file  Occupational History   Not on file  Tobacco Use   Smoking status: Former    Packs/day: 0.75    Years: 56.00    Total pack years: 42.00    Types: Cigarettes   Smokeless tobacco: Never   Tobacco comments:    Started smoking age 42, usually about 3/4 ppd Quit in 2022  Substance and Sexual Activity   Alcohol use: Yes    Alcohol/week: 2.0 standard drinks of alcohol    Types: 2 Glasses of wine per week   Drug use: No   Sexual activity: Not on file  Other Topics Concern   Not on file  Social History Narrative   Not on file   Social Determinants of Health   Financial Resource Strain: Unknown (12/15/2021)   Overall Financial Resource Strain (CARDIA)    Difficulty of Paying Living Expenses: Patient refused  Food Insecurity: No Food Insecurity (12/15/2021)   Hunger Vital Sign    Worried About Running Out of Food in the Last Year: Never true    Ran Out of  Food in the Last Year: Never true  Transportation Needs: No Transportation Needs (12/15/2021)   PRAPARE - Hydrologist (Medical): No    Lack of Transportation (Non-Medical): No  Physical Activity: Insufficiently Active (12/15/2021)   Exercise Vital Sign    Days of Exercise per Week: 2 days    Minutes of Exercise per Session: 20 min  Stress: No Stress Concern Present (12/15/2021)   [place]    Feeling of Stress : Not at all  Social Connections: Socially Integrated (12/15/2021)   Social Connection and Isolation Panel [NHANES]    Frequency of Communication with Friends and Family: More than three times a week    Frequency of Social Gatherings with Friends and Family: Twice a week    Attends Religious Services: More than 4 times per year    Active Member of Clubs or Organizations: Yes    Attends Archivist Meetings: More than 4 times per year    Marital Status: Married  Human resources officer Violence: Not At Risk (12/15/2021)   Humiliation, Afraid, Rape, and Kick questionnaire    Fear of Current or Ex-Partner: No    Emotionally Abused: No    Physically Abused: No    Sexually Abused: No    Past Surgical History:  Procedure Laterality Date   APPENDECTOMY     LEG SURGERY Right     Family History  Problem Relation Age of Onset   Prostate cancer Father    Heart disease Father        MI in his 95s, but died at age 76    Allergies  Allergen Reactions   Penicillins        Latest Ref Rng & Units 08/13/2021   11:27 AM 10/09/2017    7:31 PM  CBC  WBC 3.4 - 10.8 x10E3/uL 6.9  10.8   Hemoglobin 13.0 - 17.7 g/dL 16.2  17.9   Hematocrit 37.5 - 51.0 % 46.7  52.3   Platelets 150 - 450 x10E3/uL 175  175       CMP     Component Value Date/Time   NA 136 10/09/2017 1931   K 4.1 10/09/2017 1931   CL 101 10/09/2017 1931   CO2 25 10/09/2017 1931   GLUCOSE 158 (H) 10/09/2017 1931   BUN 16  10/09/2017 1931   CREATININE 1.11 10/09/2017 1931   CALCIUM 9.2 10/09/2017 1931   GFRNONAA >60 10/09/2017 1931   GFRAA >60 10/09/2017 1931     VAS [place] ABI WITH/WO TBI  Result Date: 03/02/2022  LOWER EXTREMITY DOPPLER STUDY Patient Name:  MASTON WIGHT  Date of Exam:   02/26/2022 Medical Rec #: [id number]         Accession #:    [id number] Date of Birth: 1945-12-04        Patient Gender: M Patient Age:   24 years Exam Location:  [facility] Procedure:      VAS [place] ABI WITH/WO TBI Referring Phys: --------------------------------------------------------------------------------  Indications: Ulceration. left  Performing Technologist: Concha Norway RVT  Examination Guidelines: A complete evaluation includes at minimum, Doppler waveform signals and systolic blood pressure reading at the level of bilateral brachial, anterior tibial, and posterior tibial arteries, when vessel segments are accessible. Bilateral testing is considered an integral part of a complete examination. Photoelectric Plethysmograph (PPG) waveforms and toe systolic pressure readings are included as required and additional duplex testing as needed. Limited examinations for reoccurring indications may be performed as noted.  ABI Findings: +---------+------------------+-----+---------+--------+ Right    Rt Pressure (mmHg)IndexWaveform Comment  +---------+------------------+-----+---------+--------+ Brachial 128                                      +---------+------------------+-----+---------+--------+ ATA      136               1.06 triphasic         +---------+------------------+-----+---------+--------+ PTA      147               1.15 triphasic         +---------+------------------+-----+---------+--------+ Great Toe125               0.98 Normal            +---------+------------------+-----+---------+--------+ +---------+------------------+-----+----------+------------------+ Left     Lt Pressure  (mmHg)IndexWaveform  Comment            +---------+------------------+-----+----------+------------------+ ATA      107               0.84 monophasic                   +---------+------------------+-----+----------+------------------+ PTA      99                0.77 monophasic                   +---------+------------------+-----+----------+------------------+ PERO                            absent    occluded by duplex +---------+------------------+-----+----------+------------------+ Great Toe75                0.59 Normal                       +---------+------------------+-----+----------+------------------+  Summary: Right: Resting right ankle-brachial index is within normal range. The right toe-brachial index is normal. Left: Resting left ankle-brachial index indicates mild left lower extremity arterial disease. The left toe-brachial index is abnormal. *See table(s) above for measurements and observations.  Electronically signed by [name] MD on 03/02/2022 at 2:08:51 PM.    Final  Assessment & Plan:   1. Atherosclerosis of native arteries of the extremities with ulceration ([place]) I had an extensive the patient in regards to his ulceration and his study results.  We discussed extensively the procedure itself as well as the risk, benefits and alternatives.  The patient wishes to discuss this with his family.  He will reach out if he decides to move forward.  2. Hyperlipidemia, mixed No new medications added at this time  3. Plantar fasciitis of left foot The likely complications worsened patient left lower extremity pain.   Current Outpatient Medications on File Prior to Visit  Medication Sig Dispense Refill   albuterol (VENTOLIN HFA) 108 (90 Base) MCG/ACT inhaler TAKE 2 PUFFS BY MOUTH EVERY 6 HOURS AS NEEDED FOR WHEEZE OR SHORTNESS OF BREATH 8.5 each 0   ALPRAZolam (XANAX) 1 MG tablet TAKE 1/2-1 TABLET BY MOUTH AT BEDTIME 30 tablet 3   ANORO ELLIPTA  62.5-25 MCG/ACT AEPB INHALE 1 PUFF BY MOUTH EVERY DAY 60 each 12   doxycycline (VIBRAMYCIN) 50 MG capsule TAKE 1 CAPSULE BY MOUTH EVERY DAY 30 capsule 9   finasteride (PROSCAR) 5 MG tablet Take 5 mg by mouth daily.     metroNIDAZOLE (METROGEL) 1 % gel Apply topically.     Naproxen Sodium (ALEVE) 220 MG CAPS Aleve     tamsulosin (FLOMAX) 0.4 MG CAPS capsule Take by mouth.     ALPRAZolam (XANAX) 1 MG tablet alprazolam 1 mg tablet     azithromycin (ZITHROMAX) 250 MG tablet azithromycin 250 mg tablet (Patient not taking: Reported on 12/15/2021)     benzonatate (TESSALON) 200 MG capsule benzonatate 200 mg capsule  TAKE 1 CAPSULE BY MOUTH THREE TIMES A DAY (Patient not taking: Reported on 12/15/2021)     cephALEXin (KEFLEX) 500 MG capsule cephalexin 500 mg capsule (Patient not taking: Reported on 12/15/2021)     CEQUA 0.09 % SOLN Apply 1 drop to eye 2 (two) times daily.     cycloSPORINE (RESTASIS) 0.05 % ophthalmic emulsion Restasis 0.05 % eye drops in a dropperette (Patient not taking: Reported on 12/15/2021)     cycloSPORINE, PF, (CEQUA) 0.09 % SOLN Apply to eye.     diazepam (VALIUM) 10 MG tablet diazepam 10 mg tablet  TAKE 1 TABLET 1 HOUR BEFORE MRI 1 IMMEDIATELY PRIOR OF NEEDED (Patient not taking: Reported on 12/15/2021)     diphenhydramine-acetaminophen (TYLENOL PM) 25-500 MG TABS tablet Take 1 tablet by mouth at bedtime as needed. (Patient not taking: Reported on 03/02/2022)     gabapentin (NEURONTIN) 300 MG capsule gabapentin 300 mg capsule  Take 1 capsule 3 times a day by oral route. (Patient not taking: Reported on 12/15/2021)     HYDROcodone-acetaminophen (NORCO/VICODIN) 5-325 MG tablet hydrocodone 5 mg-acetaminophen 325 mg tablet (Patient not taking: Reported on 12/15/2021)     levofloxacin (LEVAQUIN) 500 MG tablet levofloxacin 500 mg tablet (Patient not taking: Reported on 12/15/2021)     LORazepam (ATIVAN) 0.5 MG tablet Ativan 0.5 mg tablet  Take 1 tablet 45 minutes prior to procedure. May repeat  after 30 minutes prn.MUST BRING A DRIVER FOR PROCEDURE. (Patient not taking: Reported on 12/15/2021)     meloxicam (MOBIC) 15 MG tablet Take 1 tablet every day by oral route. (Patient not taking: Reported on 02/23/2022)     meloxicam (MOBIC) 7.5 MG tablet Take 1 tablet every day by oral route. (Patient not taking: Reported on 02/23/2022)     methocarbamol (ROBAXIN) 500 MG tablet Take by mouth. (Patient not  taking: Reported on 12/15/2021)     prednisoLONE acetate (PRED FORTE) 1 % ophthalmic suspension prednisolone acetate 1 % eye drops,suspension  PLEASE SEE ATTACHED FOR DETAILED DIRECTIONS (Patient not taking: Reported on 12/15/2021)     prednisoLONE acetate (PRED MILD) 0.12 % ophthalmic suspension Apply to eye. (Patient not taking: Reported on 12/15/2021)     predniSONE (DELTASONE) 10 MG tablet prednisone 10 mg tablet (Patient not taking: Reported on 12/15/2021)     pregabalin (LYRICA) 75 MG capsule Take by mouth. (Patient not taking: Reported on 03/02/2022)     sulfamethoxazole-trimethoprim (BACTRIM DS) 800-160 MG tablet sulfamethoxazole 800 mg-trimethoprim 160 mg tablet (Patient not taking: Reported on 12/15/2021)     terazosin (HYTRIN) 2 MG capsule Take 2 mg by mouth at bedtime. (Patient not taking: Reported on 02/23/2022)     tiZANidine (ZANAFLEX) 4 MG tablet tizanidine 4 mg tablet  Take 1 tablet every 6 hours by oral route. (Patient not taking: Reported on 12/15/2021)     traMADol (ULTRAM) 50 MG tablet tramadol 50 mg tablet  TAKE 1 TABLET BY MOUTH EVERY 6 HOURS AS NEEDED FOR PAIN (Patient not taking: Reported on 12/15/2021)     zolpidem (AMBIEN) 10 MG tablet zolpidem 10 mg tablet (Patient not taking: Reported on 12/15/2021)     No current facility-administered medications on file prior to visit.    There are no Patient Instructions on file for this visit. No follow-ups on file.   Kris Hartmann, NP

## 2022-03-03 ENCOUNTER — Telehealth (INDEPENDENT_AMBULATORY_CARE_PROVIDER_SITE_OTHER): Payer: Self-pay | Admitting: Vascular Surgery

## 2022-03-03 NOTE — Telephone Encounter (Signed)
Patient called to get scheduled for his angio. I let patient know that scheduler was in process of getting PA done. Once that was taking care of she would get in touch with him to schedule. Patient was ok with that and voiced understanding

## 2022-03-05 DIAGNOSIS — M25561 Pain in right knee: Secondary | ICD-10-CM | POA: Diagnosis not present

## 2022-03-05 DIAGNOSIS — M5451 Vertebrogenic low back pain: Secondary | ICD-10-CM | POA: Diagnosis not present

## 2022-03-06 ENCOUNTER — Telehealth (INDEPENDENT_AMBULATORY_CARE_PROVIDER_SITE_OTHER): Payer: Self-pay

## 2022-03-06 NOTE — Telephone Encounter (Signed)
Spoke with the patient and he is scheduled with Dr. Delana Meyer on 03/24/22 for a LLE angio at the [facility]. Pre-procedure instructions were discussed and will be mailed.

## 2022-03-09 ENCOUNTER — Encounter (INDEPENDENT_AMBULATORY_CARE_PROVIDER_SITE_OTHER): Payer: Self-pay

## 2022-03-10 ENCOUNTER — Telehealth (INDEPENDENT_AMBULATORY_CARE_PROVIDER_SITE_OTHER): Payer: Self-pay

## 2022-03-10 DIAGNOSIS — M25561 Pain in right knee: Secondary | ICD-10-CM | POA: Diagnosis not present

## 2022-03-10 DIAGNOSIS — M5451 Vertebrogenic low back pain: Secondary | ICD-10-CM | POA: Diagnosis not present

## 2022-03-10 NOTE — Telephone Encounter (Signed)
Pt left a message about some concerns with his surgery date, please return phone call to pt.

## 2022-03-12 DIAGNOSIS — M25561 Pain in right knee: Secondary | ICD-10-CM | POA: Diagnosis not present

## 2022-03-12 DIAGNOSIS — M5451 Vertebrogenic low back pain: Secondary | ICD-10-CM | POA: Diagnosis not present

## 2022-03-18 DIAGNOSIS — M25561 Pain in right knee: Secondary | ICD-10-CM | POA: Diagnosis not present

## 2022-03-18 DIAGNOSIS — M5451 Vertebrogenic low back pain: Secondary | ICD-10-CM | POA: Diagnosis not present

## 2022-03-20 DIAGNOSIS — M25561 Pain in right knee: Secondary | ICD-10-CM | POA: Diagnosis not present

## 2022-03-20 DIAGNOSIS — M5451 Vertebrogenic low back pain: Secondary | ICD-10-CM | POA: Diagnosis not present

## 2022-03-24 ENCOUNTER — Encounter: Payer: Self-pay | Admitting: Certified Registered Nurse Anesthetist

## 2022-03-24 ENCOUNTER — Ambulatory Visit
Admission: RE | Admit: 2022-03-24 | Discharge: 2022-03-24 | Disposition: A | Discharge status: Home / Self Care | Payer: Medicare Other | Attending: Vascular Surgery | Admitting: Vascular Surgery

## 2022-03-24 ENCOUNTER — Other Ambulatory Visit (INDEPENDENT_AMBULATORY_CARE_PROVIDER_SITE_OTHER): Payer: Self-pay | Admitting: Vascular Surgery

## 2022-03-24 ENCOUNTER — Encounter
Admission: RE | Disposition: A | Discharge status: Home / Self Care | Payer: Self-pay | Source: Home / Self Care | Attending: Vascular Surgery

## 2022-03-24 ENCOUNTER — Other Ambulatory Visit: Payer: Self-pay

## 2022-03-24 ENCOUNTER — Encounter: Payer: Self-pay | Admitting: Vascular Surgery

## 2022-03-24 DIAGNOSIS — I70243 Atherosclerosis of native arteries of left leg with ulceration of ankle: Secondary | ICD-10-CM | POA: Insufficient documentation

## 2022-03-24 DIAGNOSIS — I70201 Unspecified atherosclerosis of native arteries of extremities, right leg: Secondary | ICD-10-CM

## 2022-03-24 DIAGNOSIS — I7092 Chronic total occlusion of artery of the extremities: Secondary | ICD-10-CM | POA: Diagnosis not present

## 2022-03-24 DIAGNOSIS — M722 Plantar fascial fibromatosis: Secondary | ICD-10-CM | POA: Insufficient documentation

## 2022-03-24 DIAGNOSIS — L97329 Non-pressure chronic ulcer of left ankle with unspecified severity: Secondary | ICD-10-CM | POA: Insufficient documentation

## 2022-03-24 DIAGNOSIS — E782 Mixed hyperlipidemia: Secondary | ICD-10-CM | POA: Diagnosis not present

## 2022-03-24 DIAGNOSIS — I739 Peripheral vascular disease, unspecified: Secondary | ICD-10-CM

## 2022-03-24 DIAGNOSIS — I70219 Atherosclerosis of native arteries of extremities with intermittent claudication, unspecified extremity: Secondary | ICD-10-CM

## 2022-03-24 HISTORY — PX: LOWER EXTREMITY ANGIOGRAPHY: CATH118251

## 2022-03-24 LAB — BUN: BUN: 20 mg/dL (ref 8–23)

## 2022-03-24 LAB — CREATININE, SERUM
Creatinine, Ser: 1.06 mg/dL (ref 0.61–1.24)
GFR, Estimated: >60 mL/min (ref >60)

## 2022-03-24 SURGERY — LOWER EXTREMITY ANGIOGRAPHY
Anesthesia: Moderate Sedation | Site: Leg Lower | Laterality: Left

## 2022-03-24 MED ORDER — VANCOMYCIN HCL IN DEXTROSE 1-5 GM/200ML-% IV SOLN: 1000.0000 mg | INTRAVENOUS | Status: AC

## 2022-03-24 MED ORDER — FENTANYL CITRATE (PF) 100 MCG/2ML IJ SOLN
INTRAMUSCULAR | Status: DC | PRN
  Administered 2022-03-24: 12.5 ug via INTRAVENOUS
  Administered 2022-03-24: 25 ug via INTRAVENOUS
  Administered 2022-03-24: 50 ug via INTRAVENOUS
  Administered 2022-03-24: 12.5 ug via INTRAVENOUS

## 2022-03-24 MED ORDER — DIPHENHYDRAMINE HCL 50 MG/ML IJ SOLN: 50.0000 mg | Freq: Once | INTRAMUSCULAR | Status: DC | PRN

## 2022-03-24 MED ORDER — IODIXANOL 320 MG/ML IV SOLN
INTRAVENOUS | Status: DC | PRN
  Administered 2022-03-24: 40 mL

## 2022-03-24 MED ORDER — ACETAMINOPHEN 325 MG PO TABS: 650.0000 mg | ORAL_TABLET | ORAL | Status: DC | PRN

## 2022-03-24 MED ORDER — FAMOTIDINE 20 MG PO TABS: 40.0000 mg | ORAL_TABLET | Freq: Once | ORAL | Status: DC | PRN

## 2022-03-24 MED ORDER — HEPARIN SODIUM (PORCINE) 1000 UNIT/ML IJ SOLN
INTRAMUSCULAR | Status: AC
  Filled 2022-03-24: qty 10

## 2022-03-24 MED ORDER — MIDAZOLAM HCL 2 MG/2ML IJ SOLN
INTRAMUSCULAR | Status: AC
  Filled 2022-03-24: qty 4

## 2022-03-24 MED ORDER — SODIUM CHLORIDE 0.9 % IV SOLN: INTRAVENOUS | Status: DC

## 2022-03-24 MED ORDER — METHYLPREDNISOLONE SODIUM SUCC 125 MG IJ SOLR: 125.0000 mg | Freq: Once | INTRAMUSCULAR | Status: DC | PRN

## 2022-03-24 MED ORDER — HYDRALAZINE HCL 20 MG/ML IJ SOLN: 5.0000 mg | INTRAMUSCULAR | Status: DC | PRN

## 2022-03-24 MED ORDER — ONDANSETRON HCL 4 MG/2ML IJ SOLN: 4.0000 mg | Freq: Four times a day (QID) | INTRAMUSCULAR | Status: DC | PRN

## 2022-03-24 MED ORDER — SODIUM CHLORIDE 0.9% FLUSH: 3.0000 mL | INTRAVENOUS | Status: DC | PRN

## 2022-03-24 MED ORDER — MIDAZOLAM HCL 2 MG/2ML IJ SOLN
INTRAMUSCULAR | Status: AC
  Filled 2022-03-24: qty 2

## 2022-03-24 MED ORDER — MORPHINE SULFATE (PF) 4 MG/ML IV SOLN: 2.0000 mg | INTRAVENOUS | Status: DC | PRN

## 2022-03-24 MED ORDER — SODIUM CHLORIDE 0.9% FLUSH: 3.0000 mL | Freq: Two times a day (BID) | INTRAVENOUS | Status: DC

## 2022-03-24 MED ORDER — FENTANYL CITRATE (PF) 100 MCG/2ML IJ SOLN
INTRAMUSCULAR | Status: AC
  Filled 2022-03-24: qty 2

## 2022-03-24 MED ORDER — MIDAZOLAM HCL 2 MG/ML PO SYRP: 8.0000 mg | ORAL_SOLUTION | Freq: Once | ORAL | Status: DC | PRN

## 2022-03-24 MED ORDER — LABETALOL HCL 5 MG/ML IV SOLN: 10.0000 mg | INTRAVENOUS | Status: DC | PRN

## 2022-03-24 MED ORDER — MIDAZOLAM HCL 2 MG/2ML IJ SOLN
INTRAMUSCULAR | Status: DC | PRN
  Administered 2022-03-24 (×3): .5 mg via INTRAVENOUS
  Administered 2022-03-24: 2 mg via INTRAVENOUS

## 2022-03-24 MED ORDER — SODIUM CHLORIDE 0.9 % IV SOLN: 250.0000 mL | INTRAVENOUS | Status: DC | PRN

## 2022-03-24 MED ORDER — HYDROMORPHONE HCL 1 MG/ML IJ SOLN: 1.0000 mg | Freq: Once | INTRAMUSCULAR | Status: DC | PRN

## 2022-03-24 MED ORDER — HEPARIN SODIUM (PORCINE) 1000 UNIT/ML IJ SOLN
INTRAMUSCULAR | Status: DC | PRN
  Administered 2022-03-24: 5000 [IU] via INTRAVENOUS

## 2022-03-24 MED ORDER — OXYCODONE HCL 5 MG PO TABS: 5.0000 mg | ORAL_TABLET | ORAL | Status: DC | PRN

## 2022-03-24 MED ORDER — VANCOMYCIN HCL IN DEXTROSE 1-5 GM/200ML-% IV SOLN
INTRAVENOUS | Status: AC
  Administered 2022-03-24: 1000 mg via INTRAVENOUS
  Filled 2022-03-24: qty 200

## 2022-03-24 SURGICAL SUPPLY — 16 items
CATH ANGIO 5F PIGTAIL 65CM (CATHETERS) IMPLANT
CATH VERT 5FR 125CM (CATHETERS) IMPLANT
DEVICE STARCLOSE SE CLOSURE (Vascular Products) IMPLANT
GLIDEWIRE ADV .035X260CM (WIRE) IMPLANT
GOWN STRL REUS W/ TWL LRG LVL3 (GOWN DISPOSABLE) ×1 IMPLANT
GOWN STRL REUS W/TWL LRG LVL3 (GOWN DISPOSABLE) ×1
KIT ENCORE 26 ADVANTAGE (KITS) IMPLANT
NDL ENTRY 21GA 7CM ECHOTIP (NEEDLE) IMPLANT
NEEDLE ENTRY 21GA 7CM ECHOTIP (NEEDLE) ×1 IMPLANT
PACK ANGIOGRAPHY (CUSTOM PROCEDURE TRAY) ×1 IMPLANT
SET INTRO CAPELLA COAXIAL (SET/KITS/TRAYS/PACK) IMPLANT
SHEATH BRITE TIP 5FRX11 (SHEATH) IMPLANT
SHEATH RAABE 6FRX70 (SHEATH) IMPLANT
SYR MEDRAD MARK 7 150ML (SYRINGE) IMPLANT
TUBING CONTRAST HIGH PRESS 72 (TUBING) IMPLANT
WIRE GUIDERIGHT .035X150 (WIRE) IMPLANT

## 2022-03-24 NOTE — Op Note (Signed)
[name]  Percutaneous Study/Intervention Procedural Note   Date of Surgery: 03/24/2022,9:12 AM  Surgeon:[name], [name]   Pre-operative Diagnosis: Atherosclerotic occlusive disease bilateral lower extremities with ulcer left ankle  Post-operative diagnosis:  Same  Procedure(s) Performed:  1.  Abdominal aortogram  2.  Selective injection of the left lower extremity third order catheter placement  3.  Ultrasound-guided access to the right common femoral artery  4.  StarClose right femoral artery    Anesthesia: Conscious sedation was administered by the interventional radiology RN under my direct supervision. IV Versed plus fentanyl were utilized. Continuous ECG, pulse oximetry and blood pressure was monitored throughout the entire procedure.  Conscious sedation was administered for a total of 42 minutes and 6 seconds.  Sheath: 6 French 70 cm Rabie sheath retrograde right common femoral  Contrast: 40 cc   Fluoroscopy Time: 8.8 minutes  Indications:  The patient presents to [name] with atherosclerotic occlusive disease bilateral lower extremities with a painful ulcer of the left ankle.  Pedal pulses are nonpalpable bilaterally suggesting hemodynamically significant atherosclerotic occlusive disease.  The risks and benefits as well as alternative therapies for lower extremity revascularization are reviewed with the patient all questions are answered the patient agrees to proceed.  The patient is therefore undergoing angiography with the hope for intervention for limb salvage.   Procedure:  Christopher Bidinger Westcottis a 76 y.o. male who was identified and appropriate procedural time out was performed.  The patient was then placed supine on the table and prepped and draped in the usual sterile fashion.  Ultrasound was used to evaluate the right common femoral artery.  It was echolucent and pulsatile indicating it is patent .  An ultrasound image was acquired  for the permanent record.  A micropuncture needle was used to access the right common femoral artery under direct ultrasound guidance.  The microwire was then advanced under fluoroscopic guidance without difficulty followed by the micro-sheath.  A 0.035 J wire was advanced without resistance and a 5Fr sheath was placed.    Pigtail catheter was then advanced to the level of T12 and AP projection of the aorta was obtained. Pigtail catheter was then repositioned to above the bifurcation and RAO view of the pelvis was obtained.  Advantage Glidewire and pigtail catheter was then used across the bifurcation and the catheter was positioned in the distal external iliac artery.  LAO of the left groin was then obtained. Wire was reintroduced and negotiated into the SFA and the catheter was advanced into the SFA. Distal runoff was then performed.  The advantage wire was then advanced through the pigtail catheter and the pigtail catheter removed.  The Pinnacle sheath was then exchanged for a 6 French 70 cm Rabie sheath.  Using a Kumpe catheter and the advantage wire I attempted to cross the occlusion but this was not successful.  I elected to terminate the case.  After review of the images the catheter was removed over wire and an RAO view of the groin was obtained. StarClose device was deployed without difficulty.   Findings:   Aortogram: The abdominal aorta is opacified with a bolus injection contrast.  Single renal arteries are noted bilaterally with normal nephrograms.  No evidence of hemodynamically significant renal artery stenosis.  There are no hemodynamically significant stenoses identified within the aorta.  There does appear to be a small aneurysmal dilatation identified.  The aortic bifurcation is mildly diseased but widely patent.  Bilateral common internal and external  iliac arteries are free of hemodynamically significant lesions.  Left lower Extremity: The left common femoral, profunda femoris  proximal and mid superficial femoral arteries demonstrate mild to moderate atherosclerotic changes but there are no hemodynamically significant lesions.  The popliteal artery occludes abruptly at the level of the patella.  This occlusion runs through the entire remaining portion of the popliteal as well as the tibioperoneal trunk and trifurcation.  The trifurcation is completely occluded.  There is 2 vessel runoff to the foot the posterior tibial is reconstituted what appears to be shortly after its origin and is a good appearing tibial that is widely patent down to the foot filling the plantar arteries and the pedal arch.  The anterior tibial is reconstituted in its proximal one third and is widely patent down to the foot with some filling of the dorsalis pedis but not as briskly as the posterior tibial fills the foot  SUMMARY: Based on these images no intervention is performed at this time.  Attempts at crossing the popliteal occlusion were not successful.  Furthermore, the length of the occluded portion as well as the fact that it reconstitutes into single tibial arteries suggest that intervention may not be the most durable reconstruction and that consideration for distal superficial femoral artery to posterior tibial bypass must be discussed with the patient.    Disposition: Patient was taken to the recovery room in stable condition having tolerated the procedure well.  [name] 03/24/2022,9:12 AM

## 2022-03-24 NOTE — Interval H&P Note (Signed)
History and Physical Interval Note:  03/24/2022 8:02 AM  [name]  has presented today for surgery, with the diagnosis of LLE Angio  BARD   ASO w claudication.  The various methods of treatment have been discussed with the patient and family. After consideration of risks, benefits and other options for treatment, the patient has consented to  Procedure(s): Lower Extremity Angiography (Left) as a surgical intervention.  The patient's history has been reviewed, patient examined, no change in status, stable for surgery.  I have reviewed the patient's chart and labs.  Questions were answered to the patient's satisfaction.     [name]

## 2022-03-24 NOTE — Progress Notes (Signed)
Pt drinking water family visiting

## 2022-03-24 NOTE — Progress Notes (Signed)
Pt eating meal tray and drinking diet cola

## 2022-03-25 ENCOUNTER — Ambulatory Visit (INDEPENDENT_AMBULATORY_CARE_PROVIDER_SITE_OTHER): Payer: Medicare Other

## 2022-03-25 DIAGNOSIS — I739 Peripheral vascular disease, unspecified: Secondary | ICD-10-CM

## 2022-03-25 NOTE — Progress Notes (Signed)
MRN : [id number]  [name] is a 76 y.o. (1945-11-15) male who presents with chief complaint of check circulation.  History of Present Illness:   The patient returns to the office for followup and review status post angiogram with intervention on 03/24/2022.   Procedure: Diagnostic angiogram which shows popliteal artery occlusion at the level of the femoral condyles with reconstitution of the PT and AT relatively proximally.  The patient notes no change in the lower extremity symptoms. No interval shortening of the patient's claudication distance  with some mild rest pain symptoms. No new ulcers or wounds have occurred since the last visit but his left lateral ankle has not improved.  There have been no significant changes to the patient's overall health care.  No documented history of amaurosis fugax or recent TIA symptoms. There are no recent neurological changes noted. No documented history of DVT, PE or superficial thrombophlebitis. The patient denies recent episodes of angina or shortness of breath.   ABI's Rt=1.15 and Lt=0.84 monophasic   Angiogram 03/24/2022 of the left lower extremity arterial system shows occlusion of the mid popliteal that continues through the trifurcation.  There is reconstitution of the PT and AT which are patent to the foot.  No outpatient medications have been marked as taking for the 03/26/22 encounter (Appointment) with Delana Meyer, Dolores Lory, MD.    Past Medical History:  Diagnosis Date   Abnormal prostate specific antigen 01/23/2015   Acute bacterial sinusitis 01/23/2015   Basal cell carcinoma 03/18/2020   right cheek, excised 06/04/20   Cannot sleep 01/23/2015   Disease caused by virus 01/23/2015   Diverticulitis 01/23/2015   Diverticulosis    History of tobacco use 07/26/2009   PNA (pneumonia) 01/23/2015   Severe obstructive sleep apnea 01/23/2015    Past Surgical History:  Procedure Laterality Date   APPENDECTOMY     LEG  SURGERY Right    LOWER EXTREMITY ANGIOGRAPHY Left 03/24/2022   Procedure: Lower Extremity Angiography;  Surgeon: Katha Cabal, MD;  Location: [facility];  Service: Cardiovascular;  Laterality: Left;    Social History Social History   Tobacco Use   Smoking status: Former    Packs/day: 0.75    Years: 56.00    Total pack years: 42.00    Types: Cigarettes   Smokeless tobacco: Never   Tobacco comments:    Started smoking age 31, usually about 3/4 ppd Quit in 2022  Substance Use Topics   Alcohol use: Yes    Alcohol/week: 2.0 standard drinks of alcohol    Types: 2 Glasses of wine per week   Drug use: No    Family History Family History  Problem Relation Age of Onset   Prostate cancer Father    Heart disease Father        MI in his 22s, but died at age 72    Allergies  Allergen Reactions   Penicillins      REVIEW OF SYSTEMS (Negative unless checked)  Constitutional: '[]'$ Weight loss  '[]'$ Fever  '[]'$ Chills Cardiac: '[]'$ Chest pain   '[]'$ Chest pressure   '[]'$ Palpitations   '[]'$ Shortness of breath when laying flat   '[]'$ Shortness of breath with exertion. Vascular:  '[x]'$ Pain in legs with walking   '[]'$ Pain in legs at rest  '[]'$ History of DVT   '[]'$ Phlebitis   '[]'$ Swelling in legs   '[]'$ Varicose veins   '[]'$ Non-healing ulcers Pulmonary:   '[]'$ Uses home oxygen   '[]'$ Productive cough   '[]'$ Hemoptysis   '[]'$ Wheeze  '[]'$ COPD   '[]'$   Asthma Neurologic:  '[]'$ Dizziness   '[]'$ Seizures   '[]'$ History of stroke   '[]'$ History of TIA  '[]'$ Aphasia   '[]'$ Vissual changes   '[]'$ Weakness or numbness in arm   '[]'$ Weakness or numbness in leg Musculoskeletal:   '[]'$ Joint swelling   '[]'$ Joint pain   '[]'$ Low back pain Hematologic:  '[]'$ Easy bruising  '[]'$ Easy bleeding   '[]'$ Hypercoagulable state   '[]'$ Anemic Gastrointestinal:  '[]'$ Diarrhea   '[]'$ Vomiting  '[]'$ Gastroesophageal reflux/heartburn   '[]'$ Difficulty swallowing. Genitourinary:  '[]'$ Chronic kidney disease   '[]'$ Difficult urination  '[]'$ Frequent urination   '[]'$ Blood in urine Skin:  '[]'$ Rashes   '[]'$ Ulcers  Psychological:   '[]'$ History of anxiety   '[]'$  History of major depression.  Physical Examination  There were no vitals filed for this visit. There is no height or weight on file to calculate BMI. Gen: WD/WN, NAD Head: [place]/AT, No temporalis wasting.  Ear/Nose/Throat: Hearing grossly intact, nares w/o erythema or drainage Eyes: PER, EOMI, sclera nonicteric.  Neck: Supple, no masses.  No bruit or JVD.  Pulmonary:  Good air movement, no audible wheezing, no use of accessory muscles.  Cardiac: RRR, normal S1, S2, no Murmurs. Vascular:  mild trophic changes, no open wounds Vessel Right Left  Radial Palpable Palpable  PT Palpable Not Palpable  DP Palpable Not Palpable  Gastrointestinal: soft, non-distended. No guarding/no peritoneal signs.  Musculoskeletal: M/S 5/5 throughout.  No visible deformity.  Neurologic: CN 2-12 intact. Pain and light touch intact in extremities.  Symmetrical.  Speech is fluent. Motor exam as listed above. Psychiatric: Judgment intact, Mood & affect appropriate for pt's clinical situation. Dermatologic: No rashes or ulcers noted.  No changes consistent with cellulitis.   CBC Lab Results  Component Value Date   WBC 6.9 08/13/2021   HGB 16.2 08/13/2021   HCT 46.7 08/13/2021   MCV 95 08/13/2021   PLT 175 08/13/2021    BMET    Component Value Date/Time   NA 136 10/09/2017 1931   K 4.1 10/09/2017 1931   CL 101 10/09/2017 1931   CO2 25 10/09/2017 1931   GLUCOSE 158 (H) 10/09/2017 1931   BUN 20 03/24/2022 0728   CREATININE 1.06 03/24/2022 0728   CALCIUM 9.2 10/09/2017 1931   GFRNONAA >60 03/24/2022 0728   GFRAA >60 10/09/2017 1931   Estimated Creatinine Clearance: 72.6 mL/min (by C-G formula based on SCr of 1.06 mg/dL).  COAG No results found for: "INR", "PROTIME"  Radiology PERIPHERAL VASCULAR CATHETERIZATION  Result Date: 03/24/2022 See surgical note for result.  VAS [place] ABI WITH/WO TBI  Result Date: 03/02/2022  LOWER EXTREMITY DOPPLER STUDY Patient Name:  [name]  Date of Exam:   02/26/2022 Medical Rec #: [id number]         Accession #:    [id number] Date of Birth: May 06, 1946        Patient Gender: M Patient Age:   41 years Exam Location:  [facility] Procedure:      VAS [place] ABI WITH/WO TBI Referring Phys: --------------------------------------------------------------------------------  Indications: Ulceration. left  Performing Technologist: Concha Norway RVT  Examination Guidelines: A complete evaluation includes at minimum, Doppler waveform signals and systolic blood pressure reading at the level of bilateral brachial, anterior tibial, and posterior tibial arteries, when vessel segments are accessible. Bilateral testing is considered an integral part of a complete examination. Photoelectric Plethysmograph (PPG) waveforms and toe systolic pressure readings are included as required and additional duplex testing as needed. Limited examinations for reoccurring indications may be performed as noted.  ABI Findings: +---------+------------------+-----+---------+--------+  Right    Rt Pressure (mmHg)IndexWaveform Comment  +---------+------------------+-----+---------+--------+ Brachial 128                                      +---------+------------------+-----+---------+--------+ ATA      136               1.06 triphasic         +---------+------------------+-----+---------+--------+ PTA      147               1.15 triphasic         +---------+------------------+-----+---------+--------+ Great Toe125               0.98 Normal            +---------+------------------+-----+---------+--------+ +---------+------------------+-----+----------+------------------+ Left     Lt Pressure (mmHg)IndexWaveform  Comment            +---------+------------------+-----+----------+------------------+ ATA      107               0.84 monophasic                   +---------+------------------+-----+----------+------------------+ PTA       99                0.77 monophasic                   +---------+------------------+-----+----------+------------------+ PERO                            absent    occluded by duplex +---------+------------------+-----+----------+------------------+ Great Toe75                0.59 Normal                       +---------+------------------+-----+----------+------------------+  Summary: Right: Resting right ankle-brachial index is within normal range. The right toe-brachial index is normal. Left: Resting left ankle-brachial index indicates mild left lower extremity arterial disease. The left toe-brachial index is abnormal. *See table(s) above for measurements and observations.  Electronically signed by [name] MD on 03/02/2022 at 2:08:51 PM.    Final    VAS [place] LOWER EXTREMITY ARTERIAL DUPLEX  Result Date: 03/02/2022 LOWER EXTREMITY ARTERIAL DUPLEX STUDY Patient Name:  BROEDY OSBOURNE  Date of Exam:   02/26/2022 Medical Rec #: [id number]         Accession #:    [id number] Date of Birth: 1946-02-10        Patient Gender: M Patient Age:   56 years Exam Location:  [facility] Procedure:      VAS [place] LOWER EXTREMITY ARTERIAL DUPLEX Referring Phys: Belenda Cruise Xzavian Semmel --------------------------------------------------------------------------------   Current ABI: rt = 1.07; lt = .84 Performing Technologist: Concha Norway RVT  Examination Guidelines: A complete evaluation includes B-mode imaging, spectral Doppler, color Doppler, and power Doppler as needed of all accessible portions of each vessel. Bilateral testing is considered an integral part of a complete examination. Limited examinations for reoccurring indications may be performed as noted.   +-----------+--------+-----+--------+----------+--------+ LEFT       PSV cm/sRatioStenosisWaveform  Comments +-----------+--------+-----+--------+----------+--------+ CFA Mid    71                   triphasic           +-----------+--------+-----+--------+----------+--------+ DFA  39                   triphasic          +-----------+--------+-----+--------+----------+--------+ SFA Prox   67                   triphasic          +-----------+--------+-----+--------+----------+--------+ SFA Mid    99                   triphasic          +-----------+--------+-----+--------+----------+--------+ SFA Distal 86                   triphasic          +-----------+--------+-----+--------+----------+--------+ POP Distal 70                   monophasic         +-----------+--------+-----+--------+----------+--------+ ATA Distal 33                   monophasic         +-----------+--------+-----+--------+----------+--------+ PTA Distal 53                   monophasic         +-----------+--------+-----+--------+----------+--------+ PERO Distal             occluded                   +-----------+--------+-----+--------+----------+--------+  Summary: Left: Left leg shows mild to moderate atherosclerosis throughout. THe distal popliteal/TP trunk appears narrowed and moderately tortuous. Flow distallly is monophasic. The mid to distal peroneal artery is occluded. The non-healing ulcer is in the area of  the lateral malleous.  See table(s) above for measurements and observations. Electronically signed by [name] MD on 03/02/2022 at 2:08:43 PM.    Final      Assessment/Plan 1. Atherosclerosis of native arteries of the extremities with ulceration ([place])  Recommend:  The patient has evidence of severe atherosclerotic changes of both lower extremities associated with ulceration and tissue loss of the left foot.  This represents a limb threatening ischemia and places the patient at a high risk for limb loss.  Angiography has been performed and the situation is not ideal for intervention.  Given this finding open surgical repair is recommended.   Patient should undergo  arterial reconstruction, femoral to PT bypass of the left lower extremity with the hope for limb salvage.  The risks and benefits as well as the alternative therapies was discussed in detail with the patient.  All questions were answered.  Patient agrees to proceed with open vascular surgical reconstruction.  The patient will follow up with me in the office after the procedure.   - Ambulatory referral to Cardiology - Ambulatory referral to Orthopedic Surgery  2. Chronic obstructive pulmonary disease, unspecified COPD type ([place]) Continue pulmonary medications and aerosols as already ordered, these medications have been reviewed and there are no changes at this time.   3. Lumbar radiculopathy Continue NSAID medications as already ordered, these medications have been reviewed and there are no changes at this time.  Continued activity and therapy was stressed.  4. Hyperlipidemia, mixed Continue statin as ordered and reviewed, no changes at this time    [name], MD  03/25/2022 2:39 PM

## 2022-03-26 ENCOUNTER — Encounter (INDEPENDENT_AMBULATORY_CARE_PROVIDER_SITE_OTHER): Payer: Self-pay | Admitting: Vascular Surgery

## 2022-03-26 ENCOUNTER — Ambulatory Visit (INDEPENDENT_AMBULATORY_CARE_PROVIDER_SITE_OTHER): Payer: Medicare Other | Admitting: Vascular Surgery

## 2022-03-26 VITALS — BP 133/75 | HR 67 | Resp 19 | Ht 73.0 in | Wt 230.6 lb

## 2022-03-26 DIAGNOSIS — I7025 Atherosclerosis of native arteries of other extremities with ulceration: Secondary | ICD-10-CM | POA: Diagnosis not present

## 2022-03-26 DIAGNOSIS — J449 Chronic obstructive pulmonary disease, unspecified: Secondary | ICD-10-CM | POA: Diagnosis not present

## 2022-03-26 DIAGNOSIS — E782 Mixed hyperlipidemia: Secondary | ICD-10-CM

## 2022-03-26 DIAGNOSIS — M5416 Radiculopathy, lumbar region: Secondary | ICD-10-CM

## 2022-03-27 ENCOUNTER — Encounter: Payer: Self-pay | Admitting: Orthopedic Surgery

## 2022-03-31 DIAGNOSIS — E782 Mixed hyperlipidemia: Secondary | ICD-10-CM | POA: Diagnosis not present

## 2022-03-31 DIAGNOSIS — R9431 Abnormal electrocardiogram [ECG] [EKG]: Secondary | ICD-10-CM | POA: Diagnosis not present

## 2022-03-31 DIAGNOSIS — R0602 Shortness of breath: Secondary | ICD-10-CM | POA: Diagnosis not present

## 2022-04-01 DIAGNOSIS — M1712 Unilateral primary osteoarthritis, left knee: Secondary | ICD-10-CM | POA: Diagnosis not present

## 2022-04-06 DIAGNOSIS — R9431 Abnormal electrocardiogram [ECG] [EKG]: Secondary | ICD-10-CM | POA: Diagnosis not present

## 2022-04-06 DIAGNOSIS — R0602 Shortness of breath: Secondary | ICD-10-CM | POA: Diagnosis not present

## 2022-04-07 ENCOUNTER — Telehealth (INDEPENDENT_AMBULATORY_CARE_PROVIDER_SITE_OTHER): Payer: Self-pay

## 2022-04-07 DIAGNOSIS — I70219 Atherosclerosis of native arteries of extremities with intermittent claudication, unspecified extremity: Secondary | ICD-10-CM | POA: Diagnosis not present

## 2022-04-07 DIAGNOSIS — E782 Mixed hyperlipidemia: Secondary | ICD-10-CM | POA: Diagnosis not present

## 2022-04-07 NOTE — Telephone Encounter (Signed)
I had spoken to the patient earlier to schedule him for left femoral to post tibial bypass with Dr. Delana Meyer. Patient is scheduled on [facility] at [facility]. Pre-op phone call is on 04/27/22 between 8-1 pm.  Pre-surgical instructions were discussed and will be mailed.

## 2022-04-17 ENCOUNTER — Telehealth (INDEPENDENT_AMBULATORY_CARE_PROVIDER_SITE_OTHER): Payer: Self-pay

## 2022-04-17 NOTE — Telephone Encounter (Signed)
I attempted to contact the patient to reschedule his surgery with Dr. Delana Meyer. A message was left for a return call.

## 2022-04-17 NOTE — Telephone Encounter (Signed)
Patient returned my call and has been rescheduled from 05/06/22 to 05/13/22 for his left femoral to post tibial bypass with Dr. Delana Meyer. Pre-surgical instructions have been mailed.

## 2022-04-27 ENCOUNTER — Other Ambulatory Visit: Payer: Self-pay

## 2022-04-27 ENCOUNTER — Other Ambulatory Visit (INDEPENDENT_AMBULATORY_CARE_PROVIDER_SITE_OTHER): Payer: Self-pay | Admitting: Nurse Practitioner

## 2022-04-27 ENCOUNTER — Encounter
Admission: RE | Admit: 2022-04-27 | Discharge: 2022-04-27 | Disposition: A | Discharge status: Home / Self Care | Payer: Medicare Other | Source: Ambulatory Visit | Attending: Vascular Surgery | Admitting: Vascular Surgery

## 2022-04-27 VITALS — Ht 73.0 in | Wt 220.0 lb

## 2022-04-27 DIAGNOSIS — I7025 Atherosclerosis of native arteries of other extremities with ulceration: Secondary | ICD-10-CM

## 2022-04-27 DIAGNOSIS — E782 Mixed hyperlipidemia: Secondary | ICD-10-CM

## 2022-04-27 HISTORY — DX: Malignant neoplasm of prostate: C61

## 2022-04-27 NOTE — Patient Instructions (Addendum)
Your procedure is scheduled on: 05/13/22 Report to [facility]. To find out your arrival time please call [phone] between 1PM - 3PM on 05/12/21.  Remember: Instructions that are not followed completely may result in serious medical risk, up to and including death, or upon the discretion of your surgeon and anesthesiologist your surgery may need to be rescheduled.     _X__ 1. Do not eat food or drink any liquids after midnight the night before your procedure.                 No gum chewing or hard candies.   __X__2.  On the morning of surgery brush your teeth with toothpaste and water, you                 may rinse your mouth with mouthwash if you wish.  Do not swallow any              toothpaste of mouthwash.     _X__ 3.  No Alcohol for 24 hours before or after surgery.   _X__ 4.  Do Not Smoke or use e-cigarettes For 24 Hours Prior to Your Surgery.                 Do not use any chewable tobacco products for at least 6 hours prior to                 surgery.  ____  5.  Bring all medications with you on the day of surgery if instructed.   __X__  6.  Notify your doctor if there is any change in your medical condition      (cold, fever, infections).     Do not wear jewelry, make-up, hairpins, clips or nail polish. Do not wear lotions, powders, or perfumes. NO deodorant Do not shave body hair 48 hours prior to surgery. Men may shave face and neck. Do not bring valuables to the hospital.    [facility] is not responsible for any belongings or valuables.  Contacts, dentures/partials or body piercings may not be worn into surgery. Bring a case for your contacts, glasses or hearing aids, a denture cup will be supplied. Leave your suitcase in the car. After surgery it may be brought to your room. For patients admitted to the hospital, discharge time is determined by your treatment team.   Patients discharged the day of surgery will not  be allowed to drive home.   Please read over the following fact sheets that you were given:   CHG soap  __X__ Take these medicines the morning of surgery with A SIP OF WATER:    1. none  2.   3.   4.  5.  6.  ____ Fleet Enema (as directed)   __X__ Use CHG Soap/SAGE wipes as directed  __X__ Use inhalers on the day of surgery  ____ Stop metformin/Janumet/Farxiga 2 days prior to surgery    ____ Take 1/2 of usual insulin dose the night before surgery. No insulin the morning          of surgery.   ____ Stop Blood Thinners Coumadin/Plavix/Xarelto/Pleta/Pradaxa/Eliquis/Effient/Aspirin  on   Or contact your Surgeon, Cardiologist or Medical Doctor regarding  ability to stop your blood thinners  __X__ Stop Anti-inflammatories 7 days before surgery such as Advil, Ibuprofen, Motrin,  BC or Goodies Powder, Naprosyn, Naproxen, Aleve, Aspirin   Meloxicam, you may use Tylenol.  __X__ Stop  all herbals and supplements, fish oil or vitamins for 7 days, until after surgery.    ____ Bring C-Pap to the hospital.

## 2022-05-05 ENCOUNTER — Encounter
Admission: RE | Admit: 2022-05-05 | Discharge: 2022-05-05 | Disposition: A | Discharge status: Home / Self Care | Payer: Medicare Other | Source: Ambulatory Visit | Attending: Vascular Surgery | Admitting: Vascular Surgery

## 2022-05-05 DIAGNOSIS — E782 Mixed hyperlipidemia: Secondary | ICD-10-CM | POA: Insufficient documentation

## 2022-05-05 DIAGNOSIS — Z01812 Encounter for preprocedural laboratory examination: Secondary | ICD-10-CM | POA: Diagnosis not present

## 2022-05-05 DIAGNOSIS — I7025 Atherosclerosis of native arteries of other extremities with ulceration: Secondary | ICD-10-CM | POA: Diagnosis not present

## 2022-05-05 LAB — BASIC METABOLIC PANEL
Anion gap: 7 (ref 5–15)
BUN: 28 mg/dL — ABNORMAL HIGH (ref 8–23)
CO2: 24 mmol/L (ref 22–32)
Calcium: 9.2 mg/dL (ref 8.9–10.3)
Chloride: 109 mmol/L (ref 98–111)
Creatinine, Ser: 1.01 mg/dL (ref 0.61–1.24)
GFR, Estimated: >60 mL/min (ref >60)
Glucose, Bld: 138 mg/dL — ABNORMAL HIGH (ref 70–99)
Potassium: 4 mmol/L (ref 3.5–5.1)
Sodium: 140 mmol/L (ref 135–145)

## 2022-05-05 LAB — TYPE AND SCREEN
ABO/RH(D): A NEG
Antibody Screen: NEGATIVE

## 2022-05-05 LAB — CBC
HCT: 46 % (ref 39.0–52.0)
Hemoglobin: 15.5 g/dL (ref 13.0–17.0)
MCH: 31.5 pg (ref 26.0–34.0)
MCHC: 33.7 g/dL (ref 30.0–36.0)
MCV: 93.5 fL (ref 80.0–100.0)
Platelets: 154 10*3/uL (ref 150–400)
RBC: 4.92 MIL/uL (ref 4.22–5.81)
RDW: 12.5 % (ref 11.5–15.5)
WBC: 8.6 10*3/uL (ref 4.0–10.5)
nRBC: 0 % (ref 0.0–0.2)

## 2022-05-05 LAB — SURGICAL PCR SCREEN
MRSA, PCR: NEGATIVE
Staphylococcus aureus: NEGATIVE

## 2022-05-12 MED ORDER — CHLORHEXIDINE GLUCONATE CLOTH 2 % EX PADS: 6.0000 | MEDICATED_PAD | Freq: Once | CUTANEOUS | Status: DC

## 2022-05-12 MED ORDER — CHLORHEXIDINE GLUCONATE CLOTH 2 % EX PADS
6.0000 | MEDICATED_PAD | Freq: Once | CUTANEOUS | Status: AC
  Administered 2022-05-13: 6 via TOPICAL

## 2022-05-12 MED ORDER — VANCOMYCIN HCL 1500 MG/300ML IV SOLN
1500.0000 mg | INTRAVENOUS | Status: AC
  Administered 2022-05-13: 1500 mg via INTRAVENOUS
  Filled 2022-05-12: qty 300

## 2022-05-12 MED ORDER — ORAL CARE MOUTH RINSE: 15.0000 mL | Freq: Once | OROMUCOSAL | Status: AC

## 2022-05-12 MED ORDER — LACTATED RINGERS IV SOLN: INTRAVENOUS | Status: DC

## 2022-05-12 MED ORDER — FAMOTIDINE 20 MG PO TABS: 20.0000 mg | ORAL_TABLET | Freq: Once | ORAL | Status: AC

## 2022-05-12 MED ORDER — CHLORHEXIDINE GLUCONATE 0.12 % MT SOLN: 15.0000 mL | Freq: Once | OROMUCOSAL | Status: AC

## 2022-05-13 ENCOUNTER — Inpatient Hospital Stay
Admission: RE | Admit: 2022-05-13 | Discharge: 2022-05-16 | DRG: 254 | Disposition: A | Discharge status: Home Health | Payer: Medicare Other | Source: Ambulatory Visit | Attending: Vascular Surgery | Admitting: Vascular Surgery

## 2022-05-13 ENCOUNTER — Other Ambulatory Visit: Payer: Self-pay

## 2022-05-13 ENCOUNTER — Inpatient Hospital Stay: Payer: Medicare Other | Admitting: Certified Registered"

## 2022-05-13 ENCOUNTER — Encounter: Payer: Self-pay | Admitting: Vascular Surgery

## 2022-05-13 ENCOUNTER — Encounter
Admission: RE | Disposition: A | Discharge status: Home Health | Payer: Self-pay | Source: Ambulatory Visit | Attending: Vascular Surgery

## 2022-05-13 DIAGNOSIS — Z791 Long term (current) use of non-steroidal anti-inflammatories (NSAID): Secondary | ICD-10-CM

## 2022-05-13 DIAGNOSIS — I70243 Atherosclerosis of native arteries of left leg with ulceration of ankle: Secondary | ICD-10-CM | POA: Diagnosis not present

## 2022-05-13 DIAGNOSIS — Z8546 Personal history of malignant neoplasm of prostate: Secondary | ICD-10-CM | POA: Diagnosis not present

## 2022-05-13 DIAGNOSIS — I70245 Atherosclerosis of native arteries of left leg with ulceration of other part of foot: Principal | ICD-10-CM | POA: Diagnosis present

## 2022-05-13 DIAGNOSIS — Z87891 Personal history of nicotine dependence: Secondary | ICD-10-CM | POA: Diagnosis not present

## 2022-05-13 DIAGNOSIS — G4733 Obstructive sleep apnea (adult) (pediatric): Secondary | ICD-10-CM | POA: Diagnosis present

## 2022-05-13 DIAGNOSIS — L97909 Non-pressure chronic ulcer of unspecified part of unspecified lower leg with unspecified severity: Principal | ICD-10-CM | POA: Diagnosis present

## 2022-05-13 DIAGNOSIS — Z8249 Family history of ischemic heart disease and other diseases of the circulatory system: Secondary | ICD-10-CM

## 2022-05-13 DIAGNOSIS — L97329 Non-pressure chronic ulcer of left ankle with unspecified severity: Secondary | ICD-10-CM

## 2022-05-13 DIAGNOSIS — I70299 Other atherosclerosis of native arteries of extremities, unspecified extremity: Secondary | ICD-10-CM | POA: Diagnosis not present

## 2022-05-13 DIAGNOSIS — Z85828 Personal history of other malignant neoplasm of skin: Secondary | ICD-10-CM

## 2022-05-13 DIAGNOSIS — Z880 Allergy status to penicillin: Secondary | ICD-10-CM

## 2022-05-13 DIAGNOSIS — M5416 Radiculopathy, lumbar region: Secondary | ICD-10-CM | POA: Diagnosis present

## 2022-05-13 DIAGNOSIS — Z79899 Other long term (current) drug therapy: Secondary | ICD-10-CM

## 2022-05-13 DIAGNOSIS — Z7951 Long term (current) use of inhaled steroids: Secondary | ICD-10-CM

## 2022-05-13 DIAGNOSIS — I7025 Atherosclerosis of native arteries of other extremities with ulceration: Secondary | ICD-10-CM | POA: Diagnosis not present

## 2022-05-13 DIAGNOSIS — E782 Mixed hyperlipidemia: Secondary | ICD-10-CM | POA: Diagnosis present

## 2022-05-13 DIAGNOSIS — Z8042 Family history of malignant neoplasm of prostate: Secondary | ICD-10-CM

## 2022-05-13 DIAGNOSIS — J449 Chronic obstructive pulmonary disease, unspecified: Secondary | ICD-10-CM | POA: Diagnosis present

## 2022-05-13 HISTORY — PX: FEMORAL-TIBIAL BYPASS GRAFT: SHX938

## 2022-05-13 LAB — GLUCOSE, CAPILLARY: Glucose-Capillary: 169 mg/dL — ABNORMAL HIGH (ref 70–99)

## 2022-05-13 LAB — ABO/RH: ABO/RH(D): A NEG

## 2022-05-13 SURGERY — CREATION, BYPASS, ARTERIAL, FEMORAL TO TIBIAL, USING GRAFT
Anesthesia: General | Laterality: Left

## 2022-05-13 MED ORDER — POTASSIUM CHLORIDE CRYS ER 20 MEQ PO TBCR: 20.0000 meq | EXTENDED_RELEASE_TABLET | Freq: Every day | ORAL | Status: DC | PRN

## 2022-05-13 MED ORDER — GLYCOPYRROLATE 0.2 MG/ML IJ SOLN
INTRAMUSCULAR | Status: DC | PRN
  Administered 2022-05-13: .2 mg via INTRAVENOUS

## 2022-05-13 MED ORDER — NITROGLYCERIN IN D5W 200-5 MCG/ML-% IV SOLN: 5.0000 ug/min | INTRAVENOUS | Status: DC

## 2022-05-13 MED ORDER — FAMOTIDINE 20 MG PO TABS
ORAL_TABLET | ORAL | Status: AC
  Administered 2022-05-13: 20 mg via ORAL
  Filled 2022-05-13: qty 1

## 2022-05-13 MED ORDER — HEPARIN SODIUM (PORCINE) 5000 UNIT/ML IJ SOLN
INTRAMUSCULAR | Status: AC
  Filled 2022-05-13: qty 1

## 2022-05-13 MED ORDER — MIDAZOLAM HCL 2 MG/2ML IJ SOLN
INTRAMUSCULAR | Status: AC
  Filled 2022-05-13: qty 2

## 2022-05-13 MED ORDER — ACETAMINOPHEN 10 MG/ML IV SOLN
INTRAVENOUS | Status: AC
  Filled 2022-05-13: qty 100

## 2022-05-13 MED ORDER — PHENYLEPHRINE 80 MCG/ML (10ML) SYRINGE FOR IV PUSH (FOR BLOOD PRESSURE SUPPORT)
PREFILLED_SYRINGE | INTRAVENOUS | Status: DC | PRN
  Administered 2022-05-13 (×2): 80 ug via INTRAVENOUS
  Administered 2022-05-13: 160 ug via INTRAVENOUS
  Administered 2022-05-13 (×2): 80 ug via INTRAVENOUS

## 2022-05-13 MED ORDER — PROPOFOL 10 MG/ML IV BOLUS
INTRAVENOUS | Status: DC | PRN
  Administered 2022-05-13: 40 mg via INTRAVENOUS
  Administered 2022-05-13: 160 mg via INTRAVENOUS

## 2022-05-13 MED ORDER — HEPARIN SODIUM (PORCINE) 1000 UNIT/ML IJ SOLN
INTRAMUSCULAR | Status: DC | PRN
  Administered 2022-05-13: 2000 [IU] via INTRAVENOUS
  Administered 2022-05-13: 6000 [IU] via INTRAVENOUS

## 2022-05-13 MED ORDER — METOPROLOL TARTRATE 5 MG/5ML IV SOLN
INTRAVENOUS | Status: DC | PRN
  Administered 2022-05-13: 2 mg via INTRAVENOUS

## 2022-05-13 MED ORDER — ONDANSETRON HCL 4 MG/2ML IJ SOLN: 4.0000 mg | Freq: Once | INTRAMUSCULAR | Status: DC | PRN

## 2022-05-13 MED ORDER — SODIUM CHLORIDE 0.9 % IV SOLN: INTRAVENOUS | Status: DC | PRN

## 2022-05-13 MED ORDER — ACETAMINOPHEN 325 MG PO TABS: 325.0000 mg | ORAL_TABLET | ORAL | Status: DC | PRN

## 2022-05-13 MED ORDER — SODIUM CHLORIDE 0.9 % IV SOLN: INTRAVENOUS | Status: DC

## 2022-05-13 MED ORDER — PHENYLEPHRINE HCL-NACL 20-0.9 MG/250ML-% IV SOLN
INTRAVENOUS | Status: DC | PRN
  Administered 2022-05-13: 15 ug/min via INTRAVENOUS

## 2022-05-13 MED ORDER — BUPIVACAINE HCL (PF) 0.5 % IJ SOLN
INTRAMUSCULAR | Status: AC
  Filled 2022-05-13: qty 30

## 2022-05-13 MED ORDER — HYDROMORPHONE HCL 1 MG/ML IJ SOLN
INTRAMUSCULAR | Status: AC
  Filled 2022-05-13: qty 1

## 2022-05-13 MED ORDER — ROCURONIUM BROMIDE 100 MG/10ML IV SOLN
INTRAVENOUS | Status: DC | PRN
  Administered 2022-05-13: 40 mg via INTRAVENOUS
  Administered 2022-05-13: 10 mg via INTRAVENOUS
  Administered 2022-05-13 (×4): 50 mg via INTRAVENOUS

## 2022-05-13 MED ORDER — MORPHINE SULFATE (PF) 2 MG/ML IV SOLN
2.0000 mg | INTRAVENOUS | Status: DC | PRN
  Filled 2022-05-13: qty 1

## 2022-05-13 MED ORDER — DEXAMETHASONE SODIUM PHOSPHATE 10 MG/ML IJ SOLN
INTRAMUSCULAR | Status: DC | PRN
  Administered 2022-05-13: 10 mg via INTRAVENOUS

## 2022-05-13 MED ORDER — LACTATED RINGERS IV SOLN: INTRAVENOUS | Status: DC | PRN

## 2022-05-13 MED ORDER — CHLORHEXIDINE GLUCONATE 0.12 % MT SOLN
OROMUCOSAL | Status: AC
  Administered 2022-05-13: 15 mL via OROMUCOSAL
  Filled 2022-05-13: qty 15

## 2022-05-13 MED ORDER — MIDAZOLAM HCL 2 MG/2ML IJ SOLN
INTRAMUSCULAR | Status: DC | PRN
  Administered 2022-05-13: 2 mg via INTRAVENOUS

## 2022-05-13 MED ORDER — HYDROMORPHONE HCL 1 MG/ML IJ SOLN
INTRAMUSCULAR | Status: DC | PRN
  Administered 2022-05-13: 1 mg via INTRAVENOUS

## 2022-05-13 MED ORDER — UMECLIDINIUM-VILANTEROL 62.5-25 MCG/ACT IN AEPB
1.0000 | INHALATION_SPRAY | Freq: Every day | RESPIRATORY_TRACT | Status: DC
  Administered 2022-05-13 – 2022-05-16 (×4): 1 via RESPIRATORY_TRACT
  Filled 2022-05-13: qty 14

## 2022-05-13 MED ORDER — ALPRAZOLAM 1 MG PO TABS
1.0000 mg | ORAL_TABLET | Freq: Every evening | ORAL | Status: DC | PRN
  Administered 2022-05-13 – 2022-05-14 (×2): 1 mg via ORAL
  Filled 2022-05-13: qty 2
  Filled 2022-05-13: qty 1

## 2022-05-13 MED ORDER — HEMOSTATIC AGENTS (NO CHARGE) OPTIME
TOPICAL | Status: DC | PRN
  Administered 2022-05-13: 2

## 2022-05-13 MED ORDER — FENTANYL CITRATE (PF) 100 MCG/2ML IJ SOLN
INTRAMUSCULAR | Status: AC
  Filled 2022-05-13: qty 2

## 2022-05-13 MED ORDER — FENTANYL CITRATE (PF) 100 MCG/2ML IJ SOLN
INTRAMUSCULAR | Status: AC
  Administered 2022-05-13: 25 ug via INTRAVENOUS
  Filled 2022-05-13: qty 2

## 2022-05-13 MED ORDER — SODIUM CHLORIDE 0.9 % IV SOLN: 500.0000 mL | Freq: Once | INTRAVENOUS | Status: DC | PRN

## 2022-05-13 MED ORDER — GUAIFENESIN-DM 100-10 MG/5ML PO SYRP: 15.0000 mL | ORAL_SOLUTION | ORAL | Status: DC | PRN

## 2022-05-13 MED ORDER — SODIUM CHLORIDE 0.9 % IV SOLN
INTRAVENOUS | Status: DC | PRN
  Administered 2022-05-13 (×2): 501 mL

## 2022-05-13 MED ORDER — LABETALOL HCL 5 MG/ML IV SOLN: 10.0000 mg | INTRAVENOUS | Status: DC | PRN

## 2022-05-13 MED ORDER — SENNOSIDES-DOCUSATE SODIUM 8.6-50 MG PO TABS: 1.0000 | ORAL_TABLET | Freq: Every evening | ORAL | Status: DC | PRN

## 2022-05-13 MED ORDER — ACETAMINOPHEN 650 MG RE SUPP: 325.0000 mg | RECTAL | Status: DC | PRN

## 2022-05-13 MED ORDER — SUGAMMADEX SODIUM 500 MG/5ML IV SOLN
INTRAVENOUS | Status: DC | PRN
  Administered 2022-05-13: 500 mg via INTRAVENOUS

## 2022-05-13 MED ORDER — ACETAMINOPHEN 10 MG/ML IV SOLN
INTRAVENOUS | Status: DC | PRN
  Administered 2022-05-13: 1000 mg via INTRAVENOUS

## 2022-05-13 MED ORDER — ALBUTEROL SULFATE (2.5 MG/3ML) 0.083% IN NEBU: 3.0000 mL | INHALATION_SOLUTION | Freq: Four times a day (QID) | RESPIRATORY_TRACT | Status: DC | PRN

## 2022-05-13 MED ORDER — MAGNESIUM SULFATE 2 GM/50ML IV SOLN: 2.0000 g | Freq: Every day | INTRAVENOUS | Status: DC | PRN

## 2022-05-13 MED ORDER — ONDANSETRON HCL 4 MG/2ML IJ SOLN
INTRAMUSCULAR | Status: DC | PRN
  Administered 2022-05-13: 4 mg via INTRAVENOUS

## 2022-05-13 MED ORDER — ASPIRIN 325 MG PO TBEC
325.0000 mg | DELAYED_RELEASE_TABLET | ORAL | Status: AC
  Administered 2022-05-13: 325 mg via ORAL
  Filled 2022-05-13: qty 1

## 2022-05-13 MED ORDER — VISTASEAL 10 ML SINGLE DOSE KIT
PACK | CUTANEOUS | Status: DC | PRN
  Administered 2022-05-13: 10 mL via TOPICAL

## 2022-05-13 MED ORDER — FENTANYL CITRATE (PF) 100 MCG/2ML IJ SOLN
25.0000 ug | INTRAMUSCULAR | Status: DC | PRN
  Administered 2022-05-13 (×2): 25 ug via INTRAVENOUS

## 2022-05-13 MED ORDER — OXYCODONE-ACETAMINOPHEN 5-325 MG PO TABS
1.0000 | ORAL_TABLET | ORAL | Status: DC | PRN
  Administered 2022-05-13: 1 via ORAL
  Filled 2022-05-13: qty 1

## 2022-05-13 MED ORDER — DOCUSATE SODIUM 100 MG PO CAPS
100.0000 mg | ORAL_CAPSULE | Freq: Every day | ORAL | Status: DC
  Administered 2022-05-14 – 2022-05-16 (×3): 100 mg via ORAL
  Filled 2022-05-13 (×3): qty 1

## 2022-05-13 MED ORDER — BUPIVACAINE LIPOSOME 1.3 % IJ SUSP
INTRAMUSCULAR | Status: AC
  Filled 2022-05-13: qty 20

## 2022-05-13 MED ORDER — CLOPIDOGREL BISULFATE 75 MG PO TABS
300.0000 mg | ORAL_TABLET | ORAL | Status: AC
  Administered 2022-05-13: 300 mg via ORAL
  Filled 2022-05-13: qty 4

## 2022-05-13 MED ORDER — METOPROLOL TARTRATE 5 MG/5ML IV SOLN
INTRAVENOUS | Status: AC
  Filled 2022-05-13: qty 5

## 2022-05-13 MED ORDER — PROPOFOL 1000 MG/100ML IV EMUL
INTRAVENOUS | Status: AC
  Filled 2022-05-13: qty 100

## 2022-05-13 MED ORDER — ATORVASTATIN CALCIUM 20 MG PO TABS
20.0000 mg | ORAL_TABLET | Freq: Every evening | ORAL | Status: DC
  Administered 2022-05-13 – 2022-05-15 (×3): 20 mg via ORAL
  Filled 2022-05-13 (×3): qty 1

## 2022-05-13 MED ORDER — SUCCINYLCHOLINE CHLORIDE 200 MG/10ML IV SOSY
PREFILLED_SYRINGE | INTRAVENOUS | Status: DC | PRN
  Administered 2022-05-13: 100 mg via INTRAVENOUS

## 2022-05-13 MED ORDER — PHENOL 1.4 % MT LIQD: 1.0000 | OROMUCOSAL | Status: DC | PRN

## 2022-05-13 MED ORDER — LIDOCAINE HCL (CARDIAC) PF 100 MG/5ML IV SOSY
PREFILLED_SYRINGE | INTRAVENOUS | Status: DC | PRN
  Administered 2022-05-13: 100 mg via INTRAVENOUS

## 2022-05-13 MED ORDER — METOPROLOL TARTRATE 5 MG/5ML IV SOLN: 2.0000 mg | INTRAVENOUS | Status: DC | PRN

## 2022-05-13 MED ORDER — FAMOTIDINE IN NACL 20-0.9 MG/50ML-% IV SOLN
20.0000 mg | Freq: Two times a day (BID) | INTRAVENOUS | Status: DC
  Administered 2022-05-13 – 2022-05-15 (×5): 20 mg via INTRAVENOUS
  Filled 2022-05-13 (×6): qty 50

## 2022-05-13 MED ORDER — CEFAZOLIN SODIUM-DEXTROSE 2-4 GM/100ML-% IV SOLN
2.0000 g | Freq: Three times a day (TID) | INTRAVENOUS | Status: AC
  Administered 2022-05-13 – 2022-05-14 (×3): 2 g via INTRAVENOUS
  Filled 2022-05-13 (×3): qty 100

## 2022-05-13 MED ORDER — CLOPIDOGREL BISULFATE 75 MG PO TABS
75.0000 mg | ORAL_TABLET | Freq: Every day | ORAL | Status: DC
  Administered 2022-05-14 – 2022-05-16 (×3): 75 mg via ORAL
  Filled 2022-05-13 (×3): qty 1

## 2022-05-13 MED ORDER — VISTASEAL 10 ML SINGLE DOSE KIT
PACK | CUTANEOUS | Status: AC
  Filled 2022-05-13: qty 10

## 2022-05-13 MED ORDER — EPHEDRINE SULFATE (PRESSORS) 50 MG/ML IJ SOLN
INTRAMUSCULAR | Status: DC | PRN
  Administered 2022-05-13 (×2): 5 mg via INTRAVENOUS

## 2022-05-13 MED ORDER — ALUM & MAG HYDROXIDE-SIMETH 200-200-20 MG/5ML PO SUSP: 15.0000 mL | ORAL | Status: DC | PRN

## 2022-05-13 MED ORDER — DOPAMINE-DEXTROSE 3.2-5 MG/ML-% IV SOLN: 3.0000 ug/kg/min | INTRAVENOUS | Status: DC

## 2022-05-13 MED ORDER — FENTANYL CITRATE (PF) 100 MCG/2ML IJ SOLN
INTRAMUSCULAR | Status: DC | PRN
  Administered 2022-05-13 (×3): 50 ug via INTRAVENOUS

## 2022-05-13 MED ORDER — HYDRALAZINE HCL 20 MG/ML IJ SOLN: 5.0000 mg | INTRAMUSCULAR | Status: DC | PRN

## 2022-05-13 MED ORDER — CHLORHEXIDINE GLUCONATE CLOTH 2 % EX PADS
6.0000 | MEDICATED_PAD | Freq: Every day | CUTANEOUS | Status: DC
  Administered 2022-05-14 – 2022-05-16 (×2): 6 via TOPICAL

## 2022-05-13 MED ORDER — ONDANSETRON HCL 4 MG/2ML IJ SOLN: 4.0000 mg | Freq: Four times a day (QID) | INTRAMUSCULAR | Status: DC | PRN

## 2022-05-13 MED ORDER — SORBITOL 70 % SOLN: 30.0000 mL | Freq: Every day | Status: DC | PRN

## 2022-05-13 SURGICAL SUPPLY — 93 items
APPLIER CLIP 11 MED OPEN (CLIP)
APPLIER CLIP 9.375 SM OPEN (CLIP)
BAG DECANTER FOR FLEXI CONT (MISCELLANEOUS) ×1 IMPLANT
BAG ISOLATATION DRAPE 20X20 ST (DRAPES) ×1 IMPLANT
BLADE SURG 15 STRL LF DISP TIS (BLADE) ×1 IMPLANT
BLADE SURG 15 STRL SS (BLADE) ×1
BLADE SURG SZ11 CARB STEEL (BLADE) ×1 IMPLANT
BOOT SUTURE AID YELLOW STND (SUTURE) ×2 IMPLANT
BRUSH SCRUB EZ  4% CHG (MISCELLANEOUS) ×1
BRUSH SCRUB EZ 4% CHG (MISCELLANEOUS) ×1 IMPLANT
CANISTER WOUND CARE 500ML ATS (WOUND CARE) IMPLANT
CAP TUBING WOUND VAC TRAC (MISCELLANEOUS) IMPLANT
CATH EMBOLECTOMY 2X60 (CATHETERS) IMPLANT
CHLORAPREP W/TINT 26 (MISCELLANEOUS) ×2 IMPLANT
CLIP APPLIE 11 MED OPEN (CLIP) IMPLANT
CLIP APPLIE 9.375 SM OPEN (CLIP) IMPLANT
CONNECTOR Y WND VAC (MISCELLANEOUS) IMPLANT
COVER PROBE ULTRASOUND 5X96 (MISCELLANEOUS) IMPLANT
DERMABOND ADVANCED .7 DNX12 (GAUZE/BANDAGES/DRESSINGS) ×2 IMPLANT
DRAPE 3/4 80X56 (DRAPES) ×1 IMPLANT
DRAPE INCISE IOBAN 66X45 STRL (DRAPES) ×2 IMPLANT
DRAPE ISOLATE BAG 20X20 STRL (DRAPES) ×1
DRESSING SURGICEL FIBRLLR 1X2 (HEMOSTASIS) ×2 IMPLANT
DRSG OPSITE POSTOP 3X4 (GAUZE/BANDAGES/DRESSINGS) IMPLANT
DRSG OPSITE POSTOP 4X10 (GAUZE/BANDAGES/DRESSINGS) IMPLANT
DRSG OPSITE POSTOP 4X14 (GAUZE/BANDAGES/DRESSINGS) IMPLANT
DRSG OPSITE POSTOP 4X8 (GAUZE/BANDAGES/DRESSINGS) IMPLANT
DRSG SURGICEL FIBRILLAR 1X2 (HEMOSTASIS)
DRSG TEGADERM 4X4.75 (GAUZE/BANDAGES/DRESSINGS) IMPLANT
DRSG VAC ATS LRG SENSATRAC (GAUZE/BANDAGES/DRESSINGS) IMPLANT
ELECT CAUTERY BLADE 6.4 (BLADE) ×2 IMPLANT
ELECT REM PT RETURN 9FT ADLT (ELECTROSURGICAL) ×2
ELECTRODE REM PT RTRN 9FT ADLT (ELECTROSURGICAL) ×1 IMPLANT
GAUZE 4X4 16PLY ~~LOC~~+RFID DBL (SPONGE) ×2 IMPLANT
GEL ULTRASOUND 20GR AQUASONIC (MISCELLANEOUS) ×1 IMPLANT
GLOVE BIO SURGEON STRL SZ7 (GLOVE) ×2 IMPLANT
GLOVE SURG SYN 8.0 (GLOVE) ×1 IMPLANT
GLOVE SURG SYN 8.0 PF PI (GLOVE) ×1 IMPLANT
GOWN STRL REUS W/ TWL LRG LVL3 (GOWN DISPOSABLE) ×4 IMPLANT
GOWN STRL REUS W/ TWL XL LVL3 (GOWN DISPOSABLE) ×2 IMPLANT
GOWN STRL REUS W/TWL LRG LVL3 (GOWN DISPOSABLE) ×4
GOWN STRL REUS W/TWL XL LVL3 (GOWN DISPOSABLE) ×2
HEAD CUTTING 'VALVULOTOME URSL (MISCELLANEOUS) ×1 IMPLANT
IV NS 500ML (IV SOLUTION) ×1
IV NS 500ML BAXH (IV SOLUTION) ×1 IMPLANT
KIT PREVENA INCISION MGT20CM45 (CANNISTER) IMPLANT
KIT TURNOVER KIT A (KITS) ×1 IMPLANT
LABEL OR SOLS (LABEL) ×1 IMPLANT
LOOP RED MAXI  1X406MM (MISCELLANEOUS) ×3
LOOP VESSEL MAXI  1X406 RED (MISCELLANEOUS) ×3
LOOP VESSEL MAXI 1X406 RED (MISCELLANEOUS) ×3 IMPLANT
LOOP VESSEL MINI 0.8X406 BLUE (MISCELLANEOUS) ×2 IMPLANT
LOOPS BLUE MINI 0.8X406MM (MISCELLANEOUS) ×2
MANIFOLD NEPTUNE II (INSTRUMENTS) ×1 IMPLANT
NDL FILTER BLUNT 18X1 1/2 (NEEDLE) ×1 IMPLANT
NEEDLE FILTER BLUNT 18X1 1/2 (NEEDLE) ×1 IMPLANT
NS IRRIG 1000ML POUR BTL (IV SOLUTION) ×1 IMPLANT
PACK BASIN MAJOR ARMC (MISCELLANEOUS) ×1 IMPLANT
PACK UNIVERSAL (MISCELLANEOUS) ×1 IMPLANT
PAD PREP 24X41 OB/GYN DISP (PERSONAL CARE ITEMS) ×1 IMPLANT
PENCIL SMOKE EVACUATOR (MISCELLANEOUS) IMPLANT
SPONGE T-LAP 18X18 ~~LOC~~+RFID (SPONGE) ×3 IMPLANT
STAPLER SKIN PROX 35W (STAPLE) ×1 IMPLANT
SUT ETHIBOND CT1 BRD #0 30IN (SUTURE) IMPLANT
SUT MNCRL+ 5-0 UNDYED PC-3 (SUTURE) ×1 IMPLANT
SUT MONOCRYL 5-0 (SUTURE) ×2
SUT PROLENE 3 0 SH DA (SUTURE) ×1 IMPLANT
SUT PROLENE 5 0 RB 1 DA (SUTURE) ×2 IMPLANT
SUT PROLENE 6 0 BV (SUTURE) ×6 IMPLANT
SUT PROLENE 7 0 BV 1 (SUTURE) ×4 IMPLANT
SUT SILK 2 0 (SUTURE) ×1
SUT SILK 2 0 SH (SUTURE) ×1 IMPLANT
SUT SILK 2-0 18XBRD TIE 12 (SUTURE) ×1 IMPLANT
SUT SILK 3 0 (SUTURE) ×1
SUT SILK 3-0 18XBRD TIE 12 (SUTURE) ×1 IMPLANT
SUT SILK 4 0 (SUTURE) ×1
SUT SILK 4-0 18XBRD TIE 12 (SUTURE) ×1 IMPLANT
SUT VIC AB 2-0 CT1 (SUTURE) ×3 IMPLANT
SUT VIC AB 3-0 SH 27 (SUTURE) ×1
SUT VIC AB 3-0 SH 27X BRD (SUTURE) ×1 IMPLANT
SUT VICRYL+ 3-0 36IN CT-1 (SUTURE) ×3 IMPLANT
SYR 20ML LL LF (SYRINGE) ×1 IMPLANT
SYR 3ML LL SCALE MARK (SYRINGE) ×1 IMPLANT
SYR BULB IRRIG 60ML STRL (SYRINGE) IMPLANT
SYR TB 1ML 27GX1/2 LL (SYRINGE) IMPLANT
TAPE UMBILICAL 1/8 X36 TWILL (MISCELLANEOUS) ×1 IMPLANT
TOWEL OR 17X26 4PK STRL BLUE (TOWEL DISPOSABLE) ×1 IMPLANT
TRAP FLUID SMOKE EVACUATOR (MISCELLANEOUS) ×1 IMPLANT
TRAY FOLEY MTR SLVR 16FR STAT (SET/KITS/TRAYS/PACK) ×1 IMPLANT
VALVULOTOME HEAD CUTTING URSL (MISCELLANEOUS) IMPLANT
VALVULOTOME URESIL (MISCELLANEOUS) ×1
WATER STERILE IRR 500ML POUR (IV SOLUTION) ×1 IMPLANT
WND VAC CAP TUBING TRAC (MISCELLANEOUS)

## 2022-05-13 NOTE — Op Note (Signed)
[facility]   OPERATIVE NOTE     PRE-OPERATIVE DIAGNOSIS: Atherosclerotic occlusive disease of the left lower extremity with ulceration  POST-OPERATIVE DIAGNOSIS: Same as above  PROCEDURE: Left femoral artery to posterior tibial artery bypass with in-situ saphenous vein graft Patch angioplasty repair of the mid thigh saphenous vein with a piece of distal leg saphenous vein  SURGEONS: Leotis Pain and [name],  Cosurgeons  ASSISTANT(S): none  ANESTHESIA: general  ESTIMATED BLOOD LOSS: 50 cc  FINDING(S): Left great saphenous vein with an area in the mid thigh requiring patch angioplasty with another piece of saphenous vein  SPECIMEN(S):  none  INDICATIONS:   [name] is a 77 y.o. male who presents with ulceration of the Left lower extremity with severe atherosclerotic occlusive disease.  The patient required surgical bypass for revascularization.  Risks and benefits were discussed including but not limited to bleeding, infection, thrombosis, limb loss, injury to nearby structures, cardiopulmonary complications, and death.  Co-surgeon's were used to be able to operate and separate field simultaneously and expedite the surgical procedure.  DESCRIPTION: After full informed written consent was obtained, the patient was brought back to the operating room and placed supine upon the operating table.  Prior to induction, the patient was given intravenous antibiotics.  After obtaining adequate anesthesia, the patient was prepped and draped in the standard fashion for a femoral to posterior tibial artery bypass operation.  Attention was turned to the left groin.  A longitudinal incision was made over the left common femoral artery.  Using blunt dissection and electrocautery, the artery was dissected out from the inguinal ligament down to the femoral bifurcation.  The superficial femoral artery, profunda femoral artery, and external iliac artery were dissected  out and vessel loops applied.  Circumflex branches were also dissected and controlled with vessel loops.  This common femoral artery was patent but found on exam to be calcified.  Particularly posterior and laterally the calcification was the worst.  At this point, attention was turned to the calf.  An longitudinal incision was made one finger-width posterior to the tibia.  Using blunt dissection and electrocautery, a plane was developed through the subcutaneous tissue and fascia down to the popliteal and tibial space.  The popliteal vein was dissected out and retracted medially and posteriorly.  The tibioperoneal trunk was dissected away from the popliteal and tibial veins.  This posterior tibial artery was found to be a suitable target for the distal bypass anastomosis.      At this point, the patient's left greater saphenous vein was dissected out for harvest for conduit.  Skip incisions was made over the greater saphenous vein from the saphenofemoral junction down to the mid calf.  The vein conduit appeared to be adequate for bypass conduit.  Side branches of greater saphenous vein were tied off with 3-0 silk ties. We dissected down beyond the site of planned distal anastomosis to allow it to swing over and perform an anastomosis without tension.     At this point, the patient was given 6000 units of Heparin intravenously.  After waiting three minutes, the external iliac artery, superficial femoral artery and profunda femoral artery were clamped.  An incision was made in the distal common femoral artery and extended proximally and distally with a Potts scissor.    The vein was taken off of the saphenofemoral junction after clamping the junction.  The junction was closed with two layers of 5-0 Prolene suture.  The proximal conduit  was cut and bevelled to match the arteriotomy.  The conduit was sewn to the common femoral artery with a running stitch of 6-0 Prolene.  Prior to completing this anastomosis,  all vessels were backbled.  No thrombus was noted from any vessels and backbleeding was good.  The anastomosis was completed in the usual fashion.  We then passed the valvulotome to remove all valves and allow for flow of blood through the vein graft.   4 passes were made with the Urseal valvulotome and following this there was pulsatile blood flow initially.  Attention was then turned to the posterior tibial exposure.   I reset the exposure of the popliteal/tibial space.  I verified the posterior tibial artery was appropriately marked.  We determine the target segment for the anastomosis.  We applied tension to control the artery proximally and distally with vessel loops.  We made an incision with a 11-blade in the artery and extended it proximally and distally with a Potts scissor in the proximal posterior tibial artery. We ligated and divided the vein distally in the calf.  The distal vein was ligated with a 2-0 silk tie. I pulled the conduit to appropriate tension and length, taking into account straightening out the leg.  We adjusted the length of the conduit sharply.  We spatulated this conduit to meet the dimensions of the arteriotomy.  The conduit was sewn to the posterior tibial artery with a running stitch of 6-0 Prolene.  Prior to completing this anastomosis, all vessels were backbled. No thrombus was noted from any vessels and backbleeding was good.  The bypass conduit was allowed to bleed in an antegrade fashion but the pulsatile inflow was diminished.  Initially, we passed a 2 oh Fogarty embolectomy balloon which seemed to improve the pulsatile inflow and the anastomosis was completed in the usual fashion.  There was a clear change in the pulse from the proximal thigh to the popliteal and tibial area.  Using ultrasound, we marked out areas and small incision was made in the mid to distal thigh and a large branch was ligated but this did not improve the pulse.  We continued the proximal incision  down into the mid thigh and there was an area of the saphenous vein that was found to be somewhat thickened and the pulse clearly diminished in this location.  This was not related to any branches.  It was clear that this portion of the vein was not going to be suitable as it was in a patch angioplasty repair of the saphenous vein would be required to improve inflow and allow patency of the bypass.  It was prepared for patch angioplasty proximally and distally where clamps be placed.  We then extended the incision about 10 to 15 cm inferiorly down towards the ankle and harvested out a long segment of the distal great saphenous vein for a patch angioplasty.  This was ligated distally with a 2-0 silk tie and 3-0 silk tie sutures were used to ligate sidebranches.  The vein was then opened and prepared for patch angioplasty to the great saphenous vein in the thigh.  At this point, the great saphenous vein in the mid thigh was clamped proximally and distally and opened with an 11 blade and Potts scissors.  There was some fibrinous material in the vein consistent with potential old thrombophlebitis or some sort of other fibroblastic process.  This was cleaned out and the vessel was heparinized vigorously.  We then placed four 6-0 Prolene  sutures at the proximal and distal portions and at the mid portions and then completed the anastomosis and quadrants running the sutures to each other.  The vessel was flushed and tear prior to release of control and 6-0 Prolene patch sutures were used as needed for hemostasis.  Following this, there was excellent pulsatile flow with a palpable pulse throughout the bypass graft, and in the posterior tibial artery distal to the bypass.  At this point, all incisions were washed out and Fibrillar and Vistacel were placed into the incisions.    At this point, bleeding in both incisions were controlled with electrocautery and suture ligature.  The calf incision was closed with interrupted  deep sutures to reapproximate the deep muscles and a running stitch of 3-0 Vicryl in the subcutaneous tissue.  The skin was reapproximated with staples. .  Attention was turned to the groin.  The groin was repaired with a double layer of 2-0 Vicryl immediately superficial to the bypass conduit.  The superficial subcutaneous tissue was reapproximated with two layers of 3-0 Vicryl.  The skin was reapproximated with staples and an incisional VAC was placed over the groin incision.  The vein harvest incisions were closed with a layer of 3-0 Vicryl in the subcutaneous tissue.  The skin was then reapproximated with staples.  The skin was cleaned, dried, and sterile bandages applied.   The patient was then awakened from anesthesia and taken to the recovery room in stable condition having tolerated the procedure well.     COMPLICATIONS: none  CONDITION: stable   Leotis Pain 05/13/2022 1:29 PM   This note was created with Dragon Medical transcription system. Any errors in dictation are purely unintentional.

## 2022-05-13 NOTE — H&P (View-Only) (Signed)
MRN : [id number]  [name] is a 77 y.o. (31-Oct-1945) male who presents with chief complaint of check circulation.  History of Present Illness:   The patient returns to the office for followup and review status post angiogram with intervention on 03/24/2022.    Procedure: Diagnostic angiogram which shows popliteal artery occlusion at the level of the femoral condyles with reconstitution of the PT and AT relatively proximally.   The patient notes no change in the lower extremity symptoms. No interval shortening of the patient's claudication distance  with some mild rest pain symptoms. No new ulcers or wounds have occurred since the last visit but his left lateral ankle has not improved.   There have been no significant changes to the patient's overall health care.   No documented history of amaurosis fugax or recent TIA symptoms. There are no recent neurological changes noted. No documented history of DVT, PE or superficial thrombophlebitis. The patient denies recent episodes of angina or shortness of breath.    ABI's Rt=1.15 and Lt=0.84 monophasic    Angiogram 03/24/2022 of the left lower extremity arterial system shows occlusion of the mid popliteal that continues through the trifurcation.  There is reconstitution of the PT and AT which are patent to the foot.  Current Meds  Medication Sig   ANORO ELLIPTA 62.5-25 MCG/ACT AEPB INHALE 1 PUFF BY MOUTH EVERY DAY   doxycycline (VIBRAMYCIN) 50 MG capsule TAKE 1 CAPSULE BY MOUTH EVERY DAY   meloxicam (MOBIC) 15 MG tablet Take 15 mg by mouth daily.   Naproxen Sodium (ALEVE) 220 MG CAPS Take 440 mg by mouth daily.    Past Medical History:  Diagnosis Date   Abnormal prostate specific antigen 01/23/2015   Acute bacterial sinusitis 01/23/2015   Basal cell carcinoma 03/18/2020   right cheek, excised 06/04/20   Cannot sleep 01/23/2015   Disease caused by virus 01/23/2015   Diverticulitis 01/23/2015    Diverticulosis    History of tobacco use 07/26/2009   PNA (pneumonia) 01/23/2015   Prostate cancer ([place])    observing   Severe obstructive sleep apnea 01/23/2015    Past Surgical History:  Procedure Laterality Date   APPENDECTOMY     BACK SURGERY     CATARACT EXTRACTION Left    COLONOSCOPY     LEG SURGERY Right    meniscus repair   LOWER EXTREMITY ANGIOGRAPHY Left 03/24/2022   Procedure: Lower Extremity Angiography;  Surgeon: Katha Cabal, MD;  Location: [facility];  Service: Cardiovascular;  Laterality: Left;    Social History Social History   Tobacco Use   Smoking status: Former    Packs/day: 0.75    Years: 56.00    Total pack years: 42.00    Types: Cigarettes   Smokeless tobacco: Never   Tobacco comments:    Started smoking age 54, usually about 3/4 ppd Quit in 2022  Vaping Use   Vaping Use: Never used  Substance Use Topics   Alcohol use: Yes    Alcohol/week: 14.0 standard drinks of alcohol    Types: 14 Glasses of wine per week   Drug use: No    Family History Family History  Problem Relation Age of Onset   Prostate cancer Father    Heart disease Father        MI in his 65s, but died at age 63    Allergies  Allergen Reactions  Penicillins Hives     REVIEW OF SYSTEMS (Negative unless checked)  Constitutional: '[]'$ Weight loss  '[]'$ Fever  '[]'$ Chills Cardiac: '[]'$ Chest pain   '[]'$ Chest pressure   '[]'$ Palpitations   '[]'$ Shortness of breath when laying flat   '[]'$ Shortness of breath with exertion. Vascular:  '[x]'$ Pain in legs with walking   '[]'$ Pain in legs at rest  '[]'$ History of DVT   '[]'$ Phlebitis   '[]'$ Swelling in legs   '[]'$ Varicose veins   '[]'$ Non-healing ulcers Pulmonary:   '[]'$ Uses home oxygen   '[]'$ Productive cough   '[]'$ Hemoptysis   '[]'$ Wheeze  '[]'$ COPD   '[]'$ Asthma Neurologic:  '[]'$ Dizziness   '[]'$ Seizures   '[]'$ History of stroke   '[]'$ History of TIA  '[]'$ Aphasia   '[]'$ Vissual changes   '[]'$ Weakness or numbness in arm   '[]'$ Weakness or numbness in leg Musculoskeletal:   '[]'$ Joint swelling    '[]'$ Joint pain   '[]'$ Low back pain Hematologic:  '[]'$ Easy bruising  '[]'$ Easy bleeding   '[]'$ Hypercoagulable state   '[]'$ Anemic Gastrointestinal:  '[]'$ Diarrhea   '[]'$ Vomiting  '[]'$ Gastroesophageal reflux/heartburn   '[]'$ Difficulty swallowing. Genitourinary:  '[]'$ Chronic kidney disease   '[]'$ Difficult urination  '[]'$ Frequent urination   '[]'$ Blood in urine Skin:  '[]'$ Rashes   '[]'$ Ulcers  Psychological:  '[]'$ History of anxiety   '[]'$  History of major depression.  Physical Examination  Vitals:   05/13/22 0718  BP: (!) 145/53  Pulse: 64  Resp: 15  Temp: (!) 97 F (36.1 C)  TempSrc: Tympanic  SpO2: 97%  Weight: 99.8 kg  Height: '6\' 1"'$  (1.854 m)   Body mass index is 29.03 kg/m. Gen: WD/WN, NAD Head: [place]/AT, No temporalis wasting.  Ear/Nose/Throat: Hearing grossly intact, nares w/o erythema or drainage Eyes: PER, EOMI, sclera nonicteric.  Neck: Supple, no masses.  No bruit or JVD.  Pulmonary:  Good air movement, no audible wheezing, no use of accessory muscles.  Cardiac: RRR, normal S1, S2, no Murmurs. Vascular:  mild trophic changes, no open wounds Vessel Right Left  Radial Palpable Palpable  PT Not Palpable Not Palpable  DP Not Palpable Not Palpable  Gastrointestinal: soft, non-distended. No guarding/no peritoneal signs.  Musculoskeletal: M/S 5/5 throughout.  No visible deformity.  Neurologic: CN 2-12 intact. Pain and light touch intact in extremities.  Symmetrical.  Speech is fluent. Motor exam as listed above. Psychiatric: Judgment intact, Mood & affect appropriate for pt's clinical situation. Dermatologic: No rashes or ulcers noted.  No changes consistent with cellulitis.   CBC Lab Results  Component Value Date   WBC 8.6 05/05/2022   HGB 15.5 05/05/2022   HCT 46.0 05/05/2022   MCV 93.5 05/05/2022   PLT 154 05/05/2022    BMET    Component Value Date/Time   NA 140 05/05/2022 1009   K 4.0 05/05/2022 1009   CL 109 05/05/2022 1009   CO2 24 05/05/2022 1009   GLUCOSE 138 (H) 05/05/2022 1009   BUN 28 (H)  05/05/2022 1009   CREATININE 1.01 05/05/2022 1009   CALCIUM 9.2 05/05/2022 1009   GFRNONAA >60 05/05/2022 1009   GFRAA >60 10/09/2017 1931   Estimated Creatinine Clearance: 76.2 mL/min (by C-G formula based on SCr of 1.01 mg/dL).  COAG No results found for: "INR", "PROTIME"  Radiology No results found.   Assessment/Plan 1. Atherosclerosis of native arteries of the extremities with ulceration ([place])  Recommend:   The patient has evidence of severe atherosclerotic changes of both lower extremities associated with ulceration and tissue loss of the left foot.  This represents a limb threatening ischemia and places the patient at a high risk  for limb loss.   Angiography has been performed and the situation is not ideal for intervention.  Given this finding open surgical repair is recommended.    Patient should undergo arterial reconstruction, femoral to PT bypass of the left lower extremity with the hope for limb salvage.  The risks and benefits as well as the alternative therapies was discussed in detail with the patient.  All questions were answered.  Patient agrees to proceed with open vascular surgical reconstruction.   The patient will follow up with me in the office after the procedure.    He has been cleared by cardiology.  - Ambulatory referral to Orthopedic Surgery   2. Chronic obstructive pulmonary disease, unspecified COPD type ([place]) Continue pulmonary medications and aerosols as already ordered, these medications have been reviewed and there are no changes at this time.     3. Lumbar radiculopathy Continue NSAID medications as already ordered, these medications have been reviewed and there are no changes at this time.   Continued activity and therapy was stressed.   4. Hyperlipidemia, mixed Continue statin as ordered and reviewed, no changes at this time     [name], MD  05/13/2022 8:01 AM

## 2022-05-13 NOTE — Anesthesia Procedure Notes (Signed)
Procedure Name: Intubation Date/Time: 05/13/2022 8:38 AM  Performed by: Kelton Pillar, CRNAPre-anesthesia Checklist: Patient identified, Emergency Drugs available, Suction available and Patient being monitored Patient Re-evaluated:Patient Re-evaluated prior to induction Oxygen Delivery Method: Circle system utilized Preoxygenation: Pre-oxygenation with 100% oxygen Induction Type: IV induction Ventilation: Mask ventilation without difficulty Laryngoscope Size: McGraph and 4 Grade View: Grade I Tube type: Oral Tube size: 7.0 mm Number of attempts: 1 Airway Equipment and Method: Stylet and Oral airway Placement Confirmation: ETT inserted through vocal cords under direct vision, positive ETCO2, breath sounds checked- equal and bilateral and CO2 detector Secured at: 21 cm Tube secured with: Tape Dental Injury: Teeth and Oropharynx as per pre-operative assessment

## 2022-05-13 NOTE — Anesthesia Preprocedure Evaluation (Signed)
Anesthesia Evaluation  Patient identified by MRN, date of birth, ID band Patient awake    Reviewed: Allergy & Precautions, H&P , NPO status , Patient's Chart, lab work & pertinent test results, reviewed documented beta blocker date and time   Airway Mallampati: III  TM Distance: >3 FB Neck ROM: full    Dental  (+) Dental Advidsory Given, Teeth Intact, Caps   Pulmonary neg shortness of breath, sleep apnea , COPD,  COPD inhaler, neg recent URI, former smoker   Pulmonary exam normal breath sounds clear to auscultation       Cardiovascular Exercise Tolerance: Good (-) hypertension(-) angina + Peripheral Vascular Disease  (-) Past MI and (-) Cardiac Stents Normal cardiovascular exam(-) dysrhythmias (-) Valvular Problems/Murmurs Rhythm:regular Rate:Normal     Neuro/Psych negative neurological ROS  negative psych ROS   GI/Hepatic negative GI ROS, Neg liver ROS,,,  Endo/Other  negative endocrine ROS    Renal/GU negative Renal ROS  negative genitourinary   Musculoskeletal   Abdominal   Peds  Hematology negative hematology ROS (+)   Anesthesia Other Findings Past Medical History: 01/23/2015: Abnormal prostate specific antigen 01/23/2015: Acute bacterial sinusitis 03/18/2020: Basal cell carcinoma     Comment:  right cheek, excised 06/04/20 01/23/2015: Cannot sleep 01/23/2015: Disease caused by virus 01/23/2015: Diverticulitis No date: Diverticulosis 07/26/2009: History of tobacco use 01/23/2015: PNA (pneumonia) No date: Prostate cancer ([place])     Comment:  observing 01/23/2015: Severe obstructive sleep apnea   Reproductive/Obstetrics negative OB ROS                             Anesthesia Physical Anesthesia Plan  ASA: 2  Anesthesia Plan: General   Post-op Pain Management:    Induction: Intravenous  PONV Risk Score and Plan: 2 and Ondansetron, Dexamethasone and Treatment may vary due to  age or medical condition  Airway Management Planned: Oral ETT  Additional Equipment:   Intra-op Plan:   Post-operative Plan: Extubation in OR  Informed Consent: I have reviewed the patients History and Physical, chart, labs and discussed the procedure including the risks, benefits and alternatives for the proposed anesthesia with the patient or authorized representative who has indicated his/her understanding and acceptance.     Dental Advisory Given  Plan Discussed with: Anesthesiologist, CRNA and Surgeon  Anesthesia Plan Comments:        Anesthesia Quick Evaluation

## 2022-05-13 NOTE — Transfer of Care (Signed)
Immediate Anesthesia Transfer of Care Note  Patient: [name]  Procedure(s) Performed: BYPASS GRAFT FEMORAL-TIBIAL ARTERY (POST TIBIAL) (Left) APPLICATION OF CELL SAVER (Left)  Patient Location: PACU  Anesthesia Type:General  Level of Consciousness: awake, drowsy, and patient cooperative  Airway & Oxygen Therapy: Patient Spontanous Breathing and Patient connected to face mask oxygen  Post-op Assessment: Report given to RN and Post -op Vital signs reviewed and stable  Post vital signs: Reviewed and stable  Last Vitals:  Vitals Value Taken Time  BP 137/83 05/13/22 1346  Temp 36.7 C 05/13/22 1345  Pulse 34 05/13/22 1355  Resp 18 05/13/22 1355  SpO2 98 % 05/13/22 1355  Vitals shown include unvalidated device data.  Last Pain:  Vitals:   05/13/22 0718  TempSrc: Tympanic  PainSc: 0-No pain         Complications: No notable events documented.

## 2022-05-13 NOTE — Interval H&P Note (Signed)
History and Physical Interval Note:  05/13/2022 8:03 AM  [name]  has presented today for surgery, with the diagnosis of ASO WITH ULCERATION.  The various methods of treatment have been discussed with the patient and family. After consideration of risks, benefits and other options for treatment, the patient has consented to  Procedure(s): BYPASS GRAFT FEMORAL-TIBIAL ARTERY (POST TIBIAL) (Left) APPLICATION OF CELL SAVER (Left) as a surgical intervention.  The patient's history has been reviewed, patient examined, no change in status, stable for surgery.  I have reviewed the patient's chart and labs.  Questions were answered to the patient's satisfaction.     [name]

## 2022-05-13 NOTE — Progress Notes (Signed)
MRN : [id number]  [name] is a 77 y.o. (11/07/1945) male who presents with chief complaint of check circulation.  History of Present Illness:   The patient returns to the office for followup and review status post angiogram with intervention on 03/24/2022.    Procedure: Diagnostic angiogram which shows popliteal artery occlusion at the level of the femoral condyles with reconstitution of the PT and AT relatively proximally.   The patient notes no change in the lower extremity symptoms. No interval shortening of the patient's claudication distance  with some mild rest pain symptoms. No new ulcers or wounds have occurred since the last visit but his left lateral ankle has not improved.   There have been no significant changes to the patient's overall health care.   No documented history of amaurosis fugax or recent TIA symptoms. There are no recent neurological changes noted. No documented history of DVT, PE or superficial thrombophlebitis. The patient denies recent episodes of angina or shortness of breath.    ABI's Rt=1.15 and Lt=0.84 monophasic    Angiogram 03/24/2022 of the left lower extremity arterial system shows occlusion of the mid popliteal that continues through the trifurcation.  There is reconstitution of the PT and AT which are patent to the foot.  Current Meds  Medication Sig   ANORO ELLIPTA 62.5-25 MCG/ACT AEPB INHALE 1 PUFF BY MOUTH EVERY DAY   doxycycline (VIBRAMYCIN) 50 MG capsule TAKE 1 CAPSULE BY MOUTH EVERY DAY   meloxicam (MOBIC) 15 MG tablet Take 15 mg by mouth daily.   Naproxen Sodium (ALEVE) 220 MG CAPS Take 440 mg by mouth daily.    Past Medical History:  Diagnosis Date   Abnormal prostate specific antigen 01/23/2015   Acute bacterial sinusitis 01/23/2015   Basal cell carcinoma 03/18/2020   right cheek, excised 06/04/20   Cannot sleep 01/23/2015   Disease caused by virus 01/23/2015   Diverticulitis 01/23/2015    Diverticulosis    History of tobacco use 07/26/2009   PNA (pneumonia) 01/23/2015   Prostate cancer ([place])    observing   Severe obstructive sleep apnea 01/23/2015    Past Surgical History:  Procedure Laterality Date   APPENDECTOMY     BACK SURGERY     CATARACT EXTRACTION Left    COLONOSCOPY     LEG SURGERY Right    meniscus repair   LOWER EXTREMITY ANGIOGRAPHY Left 03/24/2022   Procedure: Lower Extremity Angiography;  Surgeon: Katha Cabal, MD;  Location: [facility];  Service: Cardiovascular;  Laterality: Left;    Social History Social History   Tobacco Use   Smoking status: Former    Packs/day: 0.75    Years: 56.00    Total pack years: 42.00    Types: Cigarettes   Smokeless tobacco: Never   Tobacco comments:    Started smoking age 46, usually about 3/4 ppd Quit in 2022  Vaping Use   Vaping Use: Never used  Substance Use Topics   Alcohol use: Yes    Alcohol/week: 14.0 standard drinks of alcohol    Types: 14 Glasses of wine per week   Drug use: No    Family History Family History  Problem Relation Age of Onset   Prostate cancer Father    Heart disease Father        MI in his 15s, but died at age 41    Allergies  Allergen Reactions  Penicillins Hives     REVIEW OF SYSTEMS (Negative unless checked)  Constitutional: '[]'$ Weight loss  '[]'$ Fever  '[]'$ Chills Cardiac: '[]'$ Chest pain   '[]'$ Chest pressure   '[]'$ Palpitations   '[]'$ Shortness of breath when laying flat   '[]'$ Shortness of breath with exertion. Vascular:  '[x]'$ Pain in legs with walking   '[]'$ Pain in legs at rest  '[]'$ History of DVT   '[]'$ Phlebitis   '[]'$ Swelling in legs   '[]'$ Varicose veins   '[]'$ Non-healing ulcers Pulmonary:   '[]'$ Uses home oxygen   '[]'$ Productive cough   '[]'$ Hemoptysis   '[]'$ Wheeze  '[]'$ COPD   '[]'$ Asthma Neurologic:  '[]'$ Dizziness   '[]'$ Seizures   '[]'$ History of stroke   '[]'$ History of TIA  '[]'$ Aphasia   '[]'$ Vissual changes   '[]'$ Weakness or numbness in arm   '[]'$ Weakness or numbness in leg Musculoskeletal:   '[]'$ Joint swelling    '[]'$ Joint pain   '[]'$ Low back pain Hematologic:  '[]'$ Easy bruising  '[]'$ Easy bleeding   '[]'$ Hypercoagulable state   '[]'$ Anemic Gastrointestinal:  '[]'$ Diarrhea   '[]'$ Vomiting  '[]'$ Gastroesophageal reflux/heartburn   '[]'$ Difficulty swallowing. Genitourinary:  '[]'$ Chronic kidney disease   '[]'$ Difficult urination  '[]'$ Frequent urination   '[]'$ Blood in urine Skin:  '[]'$ Rashes   '[]'$ Ulcers  Psychological:  '[]'$ History of anxiety   '[]'$  History of major depression.  Physical Examination  Vitals:   05/13/22 0718  BP: (!) 145/53  Pulse: 64  Resp: 15  Temp: (!) 97 F (36.1 C)  TempSrc: Tympanic  SpO2: 97%  Weight: 99.8 kg  Height: '6\' 1"'$  (1.854 m)   Body mass index is 29.03 kg/m. Gen: WD/WN, NAD Head: [place]/AT, No temporalis wasting.  Ear/Nose/Throat: Hearing grossly intact, nares w/o erythema or drainage Eyes: PER, EOMI, sclera nonicteric.  Neck: Supple, no masses.  No bruit or JVD.  Pulmonary:  Good air movement, no audible wheezing, no use of accessory muscles.  Cardiac: RRR, normal S1, S2, no Murmurs. Vascular:  mild trophic changes, no open wounds Vessel Right Left  Radial Palpable Palpable  PT Not Palpable Not Palpable  DP Not Palpable Not Palpable  Gastrointestinal: soft, non-distended. No guarding/no peritoneal signs.  Musculoskeletal: M/S 5/5 throughout.  No visible deformity.  Neurologic: CN 2-12 intact. Pain and light touch intact in extremities.  Symmetrical.  Speech is fluent. Motor exam as listed above. Psychiatric: Judgment intact, Mood & affect appropriate for pt's clinical situation. Dermatologic: No rashes or ulcers noted.  No changes consistent with cellulitis.   CBC Lab Results  Component Value Date   WBC 8.6 05/05/2022   HGB 15.5 05/05/2022   HCT 46.0 05/05/2022   MCV 93.5 05/05/2022   PLT 154 05/05/2022    BMET    Component Value Date/Time   NA 140 05/05/2022 1009   K 4.0 05/05/2022 1009   CL 109 05/05/2022 1009   CO2 24 05/05/2022 1009   GLUCOSE 138 (H) 05/05/2022 1009   BUN 28 (H)  05/05/2022 1009   CREATININE 1.01 05/05/2022 1009   CALCIUM 9.2 05/05/2022 1009   GFRNONAA >60 05/05/2022 1009   GFRAA >60 10/09/2017 1931   Estimated Creatinine Clearance: 76.2 mL/min (by C-G formula based on SCr of 1.01 mg/dL).  COAG No results found for: "INR", "PROTIME"  Radiology No results found.   Assessment/Plan 1. Atherosclerosis of native arteries of the extremities with ulceration ([place])  Recommend:   The patient has evidence of severe atherosclerotic changes of both lower extremities associated with ulceration and tissue loss of the left foot.  This represents a limb threatening ischemia and places the patient at a high risk  for limb loss.   Angiography has been performed and the situation is not ideal for intervention.  Given this finding open surgical repair is recommended.    Patient should undergo arterial reconstruction, femoral to PT bypass of the left lower extremity with the hope for limb salvage.  The risks and benefits as well as the alternative therapies was discussed in detail with the patient.  All questions were answered.  Patient agrees to proceed with open vascular surgical reconstruction.   The patient will follow up with me in the office after the procedure.    He has been cleared by cardiology.  - Ambulatory referral to Orthopedic Surgery   2. Chronic obstructive pulmonary disease, unspecified COPD type ([place]) Continue pulmonary medications and aerosols as already ordered, these medications have been reviewed and there are no changes at this time.     3. Lumbar radiculopathy Continue NSAID medications as already ordered, these medications have been reviewed and there are no changes at this time.   Continued activity and therapy was stressed.   4. Hyperlipidemia, mixed Continue statin as ordered and reviewed, no changes at this time     [name], MD  05/13/2022 8:01 AM

## 2022-05-13 NOTE — Plan of Care (Signed)
  Problem: Education: Goal: Knowledge of prescribed regimen will improve Outcome: Progressing   Problem: Activity: Goal: Ability to tolerate increased activity will improve Outcome: Progressing   Problem: Bowel/Gastric: Goal: Gastrointestinal status for postoperative course will improve Outcome: Progressing   Problem: Clinical Measurements: Goal: Postoperative complications will be avoided or minimized Outcome: Progressing Goal: Signs and symptoms of graft occlusion will improve Outcome: Progressing   Problem: Skin Integrity: Goal: Demonstration of wound healing without infection will improve Outcome: Progressing   Problem: Education: Goal: Knowledge of General Education information will improve Description: Including pain rating scale, medication(s)/side effects and non-pharmacologic comfort measures Outcome: Progressing   Problem: Health Behavior/Discharge Planning: Goal: Ability to manage health-related needs will improve Outcome: Progressing   Problem: Clinical Measurements: Goal: Ability to maintain clinical measurements within normal limits will improve Outcome: Progressing Goal: Will remain free from infection Outcome: Progressing Goal: Diagnostic test results will improve Outcome: Progressing Goal: Respiratory complications will improve Outcome: Progressing Goal: Cardiovascular complication will be avoided Outcome: Progressing   Problem: Activity: Goal: Risk for activity intolerance will decrease Outcome: Progressing   Problem: Nutrition: Goal: Adequate nutrition will be maintained Outcome: Progressing   Problem: Coping: Goal: Level of anxiety will decrease Outcome: Progressing   Problem: Elimination: Goal: Will not experience complications related to bowel motility Outcome: Progressing Goal: Will not experience complications related to urinary retention Outcome: Progressing   Problem: Pain Managment: Goal: General experience of comfort will  improve Outcome: Progressing   Problem: Safety: Goal: Ability to remain free from injury will improve Outcome: Progressing   Problem: Skin Integrity: Goal: Risk for impaired skin integrity will decrease Outcome: Progressing

## 2022-05-13 NOTE — Op Note (Signed)
[facility]    OPERATIVE NOTE   PROCEDURE: left common femoral artery to left posterior tibial artery bypass with in situ great saphenous vein. Vein patch angioplasty of the proximal portion of the vein bypass. Thrombectomy of the vein graft using a #2 Fogarty thromboembolectomy balloon. Application of a disposable VAC left groin incision  PRE-OPERATIVE DIAGNOSIS: Atherosclerotic Occlusive Disease with ulceration of the left lateral ankle  POST-OPERATIVE DIAGNOSIS:  Atherosclerotic Occlusive Disease with ulceration of the left lateral ankle  CO-SURGEONS: Katha Cabal, M.D. and Erskine Squibb. Dew  ASSISTANT(S): none  ANESTHESIA: general  ESTIMATED BLOOD LOSS: 50 cc  FINDING(S): None   SPECIMEN(S): None  INDICATIONS:   [name] is a 77 y.o. male who presents with ulceration of the left lateral ankle.  Angiography has been performed and he is not a good candidate for intervention therefore bypass has been recommended.  The risk, benefits, and alternative for bypass operations were discussed with the patient.  The patient is aware the risks include but are not limited to: bleeding, infection, myocardial infarction, stroke, limb loss, nerve damage, need for additional procedures in the future, wound complications, and inability to complete the bypass.  The patient is voices understanding of these risks and agreed to proceed.  DESCRIPTION: After informed consent was obtained, the patient was brought back to the operating room and placed in the supine position.  Prior to induction, the patient was given intravenous antibiotics.  After general anesthesia is induced, the patient was prepped and draped in the standard fashion for a femoral to popliteal bypass operation.  Appropriate timeout is called.  Co-surgeons are required because this is a complex multilevel procedure with work being performed simultaneously from both the patient's right and left sides.  This also  expedites the procedure making a shorter operative time reducing complications and improving patient safety.   Attention was turned to the left groin.  A longitudinal incision was made over the left common femoral artery.  Using blunt dissection and electrocautery, the artery was dissected circumferentially from the inguinal ligament down to the femoral bifurcation.  The superficial femoral artery, profunda femoral artery, and distal external iliac artery are looped with Silastic vessel loops.  Attention was then turned to the saphenofemoral junction.   The junction was then skeletonized and tributaries ligated and divided between 2-0 and 3-0 silk until the saphenous vein proper had been fully mobilized for its proximal 10 cm.  Medial incision was then made in the calf and the great saphenous vein was identified.  It was then dissected circumferentially from the level of the knee distally for approximately 15 cm.  Tributaries were ligated and divided between 2-0 and 3-0 silk ties and the vein was freed from the surrounding tissue tissue circumferentially.  The dissection was then carried deep opening the fascia and identifying the distal popliteal artery and then following this distally to expose the posterior tibial artery.  The posterior tibial artery was dissected circumferentially and looped with Silastic vessel loops.   The patient was given 6000 units of Heparin intravenously, and this is allowed to circulate for proximally 5 minutes.  The proximal common femoral artery, superficial femoral artery, and profunda femoral artery were then clamped along with any circumflex branches.  Arteriotomy is then made in the distal common femoral artery with an 11 blade and extended with Potts scissors. Arteriotomy is extended towards the superficial femoral artery. The common femoral artery was found to be atherosclerotic with plaque present posteriorly  but not anteriorly therefore endarterectomy was not needed.     The saphenous vein and common femoral vein was then clamped with a Satinsky clamp and the saphenous vein excised from the anterior wall of the common femoral vein.  The venotomy in the common femoral vein was then closed using a 5-0 Prolene in a horizontal mattress followed by and over and under stitch.  The Satinsky clamp was released and hemostasis was noted.  Attention was then turned to the saphenous vein where the proximalmost valve was grasped with forceps the vein was inverted and the valve was lysed using tenotomy scissors under direct visualization.  The vein was then approximated to the anterior distal common femoral vein and the arteriotomy in the common femoral artery was trimmed to match the vein.  End vein to side femoral artery anastomosis was fashioned using 6-0 Prolene suture and the vein was pressurized.  Immediately filled down to the first intact valve.  Attention was then turned to the tibial target site.  The saphenous vein was then ligated at the distal aspect of the incision using a 2-0 silk tie.  The vein was marked to maintain orientation and then it was transected with Metzenbaum scissors.  The ureterocele valvulotome was then advanced from the distal vein proximally while palpating the proximal vein to ensure the location of the valvulotome and prevent crossing the anastomosis with the blades of the valvulotome.  2 passes were made and pulsatile blood was noted coming from the distal vein.  The vein was then approximated to the posterior tibial artery and the leg was straightened the vein was checked to make sure that it was in the proper orientation and not twisted.  It was then trimmed and spatulated using Potts scissors.  Arteriotomy was then made in the posterior tibial artery and extended with Potts scissors.  6-0 Prolene stay sutures were placed and then an end vein to side posterior target tibial artery anastomosis was fashioned using running 6-0 Prolene suture.  Flushing  maneuvers were performed but there was very poor forward flow.  Through the gap in the suture line of the anastomosis the valvulotome was reintroduced and 2 more passes were made and flow was reestablished although it seems somewhat diminished.  The suture line was completed and flow was established in a forward direction.  Unfortunately, there was still a weak pulse noted.  At this point I was concerned that there were large sidebranches siphoning away the flow and the ultrasound was delivered onto the field and the vein was mapped from the knee up to the groin.  Only 2 branches were identified 1 in the mid thigh and 1 just distal to the end of the groin incision.  The groin incision was extended and this branch was ligated.  A small linear incision was created and the other mid thigh branch was identified and then ligated.  Unfortunately there was still a poor pulse the leg was straightened and final measurements were made.  Prior to completing the suture line in the distal anastomosis a #2 Fogarty was also passed to ensure there was no thrombus but no thrombotic material was returned.  Given that the pulse was fairly weak even at the mid thigh incision I felt that there must be some persistent defect in the vein between the groin incision and the mid thigh incision.  Therefore we extended the groin incision more distally and identified a segment that appeared to be a transition from an excellent pulse noted  in the proximal 10 cm to the decreased pulse noted distally.  Gentle manipulation of the vein did not improve the distal pulse.  At this point I felt that vein patch angioplasty would be the most appropriate treatment and a segment of saphenous vein was then exposed extending the calf incision more distally.  Approximately 10 cm of vein was harvested.  The vein graft was then clamped proximally and distally in the area of concern was opened with an 11 blade and Potts scissors.  A fair amount of granular  material was noted within the vein itself and this was then removed with a freer elevator until the endothelium was smooth and free of any other defects.  The segment of vein that we harvested was reversed it was opened with Potts scissors to create a patch and then a vein patch angioplasty was applied to the bypass graft using running 6-0 Prolene in a four-quadrant technique.  Flushing maneuvers were performed and flow was reestablished now down to the foot.  Any gaps in the suture line were controlled with 6-0 Prolene interrupted sutures.  At this point now we had an excellent pulse distally as well as a 2+ palpable posterior tibial pulse at the level of the medial malleolus.  This is a marked improvement I felt that we had achieved a durable bypass graft.  The wounds were then irrigated with sterile saline.  The wounds were then inspected for hemostasis. Bleeding points were controlled with electrocautery, and suture repair of active bleeding points.  Fibrillar and Vistaseal were then placed in the bed of the wounds. Once hemostasis was achieved.  The groin incision was then closed in multiple layers using both 2-0 and 3-0 Vicryl in interrupted and running fashion. Skin was closed with a staples. The medial calf and medial thigh wound were then closed layers using  3-0 Vicryl, and staples.   A Prevena dressing was applied to the groin incision and honeycomb dressings were applied to the thigh and calf incisions.   COMPLICATIONS: None  CONDITION: Good  Katha Cabal, M.D. [facility] Office: [phone]  05/13/2022, 1:21 PM

## 2022-05-14 ENCOUNTER — Encounter: Payer: Self-pay | Admitting: Vascular Surgery

## 2022-05-14 LAB — BASIC METABOLIC PANEL
Anion gap: 8 (ref 5–15)
BUN: 17 mg/dL (ref 8–23)
CO2: 22 mmol/L (ref 22–32)
Calcium: 8.3 mg/dL — ABNORMAL LOW (ref 8.9–10.3)
Chloride: 105 mmol/L (ref 98–111)
Creatinine, Ser: 1.02 mg/dL (ref 0.61–1.24)
GFR, Estimated: >60 mL/min (ref >60)
Glucose, Bld: 142 mg/dL — ABNORMAL HIGH (ref 70–99)
Potassium: 4.3 mmol/L (ref 3.5–5.1)
Sodium: 135 mmol/L (ref 135–145)

## 2022-05-14 LAB — CBC
HCT: 39.7 % (ref 39.0–52.0)
Hemoglobin: 13.4 g/dL (ref 13.0–17.0)
MCH: 31.4 pg (ref 26.0–34.0)
MCHC: 33.8 g/dL (ref 30.0–36.0)
MCV: 93 fL (ref 80.0–100.0)
Platelets: 177 10*3/uL (ref 150–400)
RBC: 4.27 MIL/uL (ref 4.22–5.81)
RDW: 12.6 % (ref 11.5–15.5)
WBC: 13.9 10*3/uL — ABNORMAL HIGH (ref 4.0–10.5)
nRBC: 0 % (ref 0.0–0.2)

## 2022-05-14 MED ORDER — POLYETHYLENE GLYCOL 3350 17 G PO PACK
17.0000 g | PACK | Freq: Every day | ORAL | Status: DC
  Administered 2022-05-14 – 2022-05-15 (×2): 17 g via ORAL
  Filled 2022-05-14 (×2): qty 1

## 2022-05-14 MED ORDER — BISACODYL 5 MG PO TBEC
5.0000 mg | DELAYED_RELEASE_TABLET | Freq: Every day | ORAL | Status: DC | PRN
  Administered 2022-05-16: 5 mg via ORAL
  Filled 2022-05-14: qty 1

## 2022-05-14 NOTE — Progress Notes (Signed)
PT A&OX4. Wound vac intact with no drainage to any sites. Denies pain. Voided 1x with urinal and denies pressure, discomfort, or feeling of needing to void at this time. VSS. Pedal pulses 2+ bilaterally. Report called to [facility] RN.

## 2022-05-14 NOTE — Evaluation (Signed)
Physical Therapy Evaluation Patient Details Name: [name] MRN: [id number] DOB: Sep 02, 1945 Today's Date: 05/14/2022  History of Present Illness  [name] is a 77 y.o. male who presents with ulceration of the Left lower extremity with severe atherosclerotic occlusive disease.  The patient required surgical bypass for revascularization. Patient is s/p Left femoral artery to posterior tibial artery bypass with in-situ saphenous vein graft  Patch angioplasty repair of the mid thigh saphenous vein with a piece of distal leg saphenous vein   Clinical Impression  Patient received in recliner, son at bedside who left at beginning of session. Patient is mod independent with transfers, ambulated 300 feet with RW and min guard. No lob or significant difficulty noted. May be able to ambulate without AD next session. He will continue to benefit from skilled PT to ensure safety with mobility and independence.         Recommendations for follow up therapy are one component of a multi-disciplinary discharge planning process, led by the attending physician.  Recommendations may be updated based on patient status, additional functional criteria and insurance authorization.  Follow Up Recommendations No PT follow up      Assistance Recommended at Discharge Intermittent Supervision/Assistance  Patient can return home with the following  Help with stairs or ramp for entrance;Assist for transportation;A little help with walking and/or transfers;A little help with bathing/dressing/bathroom    Equipment Recommendations Other (comment) (TBD, likely will not need anything)  Recommendations for Other Services       Functional Status Assessment Patient has had a recent decline in their functional status and demonstrates the ability to make significant improvements in function in a reasonable and predictable amount of time.     Precautions / Restrictions Precautions Precautions:  Fall Restrictions Weight Bearing Restrictions: No      Mobility  Bed Mobility               General bed mobility comments: NT, patient received in recliner and returned to recliner    Transfers Overall transfer level: Needs assistance Equipment used: Rolling walker (2 wheels) Transfers: Sit to/from Stand Sit to Stand: Supervision                Ambulation/Gait Ambulation/Gait assistance: Min guard Gait Distance (Feet): 300 Feet Assistive device: Rolling walker (2 wheels) Gait Pattern/deviations: Step-through pattern Gait velocity: WNL     General Gait Details: generally ambulating at normal speed, no lob, no significant pain reported. Light use of AD, may be able to go without AD next session  Stairs            Wheelchair Mobility    Modified Rankin (Stroke Patients Only)       Balance Overall balance assessment: Modified Independent                                           Pertinent Vitals/Pain Pain Assessment Pain Assessment: Faces Faces Pain Scale: Hurts a little bit Pain Location: groin (tape) Pain Descriptors / Indicators: Discomfort Pain Intervention(s): Monitored during session    Home Living Family/patient expects to be discharged to:: Private residence Living Arrangements: Spouse/significant other Available Help at Discharge: Family;Available 24 hours/day Type of Home: House       Alternate Level Stairs-Number of Steps: flight Home Layout: Two level;Bed/bath upstairs Home Equipment: None      Prior Function Prior Level of Function :  Independent/Modified Independent             Mobility Comments: independent ADLs Comments: independent     Hand Dominance        Extremity/Trunk Assessment   Upper Extremity Assessment Upper Extremity Assessment: Defer to OT evaluation    Lower Extremity Assessment Lower Extremity Assessment: Overall WFL for tasks assessed    Cervical / Trunk  Assessment Cervical / Trunk Assessment: Normal  Communication   Communication: HOH  Cognition Arousal/Alertness: Awake/alert Behavior During Therapy: WFL for tasks assessed/performed Overall Cognitive Status: Within Functional Limits for tasks assessed                                          General Comments      Exercises     Assessment/Plan    PT Assessment Patient needs continued PT services  PT Problem List Decreased activity tolerance;Decreased mobility;Decreased knowledge of use of DME       PT Treatment Interventions DME instruction;Gait training;Stair training;Functional mobility training;Therapeutic activities;Patient/family education;Therapeutic exercise    PT Goals (Current goals can be found in the Care Plan section)  Acute Rehab PT Goals Patient Stated Goal: to return home PT Goal Formulation: With patient Time For Goal Achievement: 05/21/22 Potential to Achieve Goals: Good    Frequency Min 2X/week     Co-evaluation               AM-PAC PT "6 Clicks" Mobility  Outcome Measure Help needed turning from your back to your side while in a flat bed without using bedrails?: None Help needed moving from lying on your back to sitting on the side of a flat bed without using bedrails?: None Help needed moving to and from a bed to a chair (including a wheelchair)?: A Little Help needed standing up from a chair using your arms (e.g., wheelchair or bedside chair)?: None Help needed to walk in hospital room?: A Little Help needed climbing 3-5 steps with a railing? : A Little 6 Click Score: 21    End of Session Equipment Utilized During Treatment: Gait belt Activity Tolerance: Patient tolerated treatment well Patient left: in chair;with call bell/phone within reach Nurse Communication: Mobility status PT Visit Diagnosis: Other abnormalities of gait and mobility (R26.89);Difficulty in walking, not elsewhere classified (R26.2);Pain Pain -  Right/Left: Left Pain - part of body:  (groin area due to tape)    Time: 1033-1050 PT Time Calculation (min) (ACUTE ONLY): 17 min   Charges:   PT Evaluation $PT Eval Moderate Complexity: 1 Mod PT Treatments $Gait Training: 8-22 mins        Angela Platner, PT, GCS 05/14/22,11:02 AM

## 2022-05-14 NOTE — Progress Notes (Signed)
RN attempted to remove foley catheter per protocol and foley would not come out despite balloon deflation. Pt C/O pain to site. RN aborted removing foley. Called MD, MD contacted urology. In the meantime pt stood at bedside and foley fell out with a dribble of blood. MD made aware. Pt verbalized relief of pain. Will continue to monitor. Pt up to chair. OT at bedside.

## 2022-05-14 NOTE — Anesthesia Postprocedure Evaluation (Signed)
Anesthesia Post Note  Patient: [name]  Procedure(s) Performed: BYPASS GRAFT FEMORAL-TIBIAL ARTERY (POST TIBIAL) (Left) APPLICATION OF CELL SAVER (Left)  Patient location during evaluation: ICU Anesthesia Type: General Pain management: pain level controlled Vital Signs Assessment: post-procedure vital signs reviewed and stable Respiratory status: spontaneous breathing and nonlabored ventilation Cardiovascular status: blood pressure returned to baseline and stable Postop Assessment: no headache and no backache Anesthetic complications: no  No notable events documented.   Last Vitals:  Vitals:   05/14/22 0700 05/14/22 0745  BP: 128/62   Pulse: 61 60  Resp: 19 (!) 27  Temp:  37.3 C  SpO2: 97% 98%    Last Pain:  Vitals:   05/14/22 0745  TempSrc: Oral  PainSc: 0-No pain                 Johnna Acosta

## 2022-05-14 NOTE — Evaluation (Signed)
Occupational Therapy Evaluation Patient Details Name: [name] MRN: [id number] DOB: 10/18/1945 Today's Date: 05/14/2022   History of Present Illness [name] is a 77 y.o. male who presents with ulceration of the Left lower extremity with severe atherosclerotic occlusive disease.  The patient required surgical bypass for revascularization. Patient is s/p Left femoral artery to posterior tibial artery bypass with in-situ saphenous vein graft  Patch angioplasty repair of the mid thigh saphenous vein with a piece of distal leg saphenous vein   Clinical Impression   Upon entering the room, pt supine in bed with son present in room. Pt is agreeable to OT evaluation. Pt reports being Ind at baseline without use of AD and has assistance at discharge. Pt performed bed mobility without physical assistance. Pt stands and ambulates ~ 200' without use of AD at supervision level. OT educating pt on LB self care to increase independence/ease. Pt verbalized understanding.Pt sits in recliner chair at end of session with son present in room and all needs within reach.  Pt does not have skilled acute OT needs at this time. OT to complete order.      Recommendations for follow up therapy are one component of a multi-disciplinary discharge planning process, led by the attending physician.  Recommendations may be updated based on patient status, additional functional criteria and insurance authorization.   Follow Up Recommendations  No OT follow up     Assistance Recommended at Discharge None     Functional Status Assessment  Patient has not had a recent decline in their functional status  Equipment Recommendations  None recommended by OT       Precautions / Restrictions Precautions Precautions: Fall Restrictions Weight Bearing Restrictions: No      Mobility Bed Mobility Overal bed mobility: Modified Independent             General bed mobility comments: HOB elevated but no  physical assistance    Transfers Overall transfer level: Needs assistance Equipment used: None Transfers: Sit to/from Stand Sit to Stand: Supervision                  Balance Overall balance assessment: Modified Independent                                         ADL either performed or assessed with clinical judgement   ADL Overall ADL's : Needs assistance/impaired                                       General ADL Comments: supervision overall     Vision Patient Visual Report: No change from baseline              Pertinent Vitals/Pain Pain Assessment Pain Assessment: Faces Faces Pain Scale: Hurts a little bit Pain Location: groin Pain Descriptors / Indicators: Discomfort Pain Intervention(s): Monitored during session, Repositioned     Hand Dominance Right   Extremity/Trunk Assessment Upper Extremity Assessment Upper Extremity Assessment: Overall WFL for tasks assessed   Lower Extremity Assessment Lower Extremity Assessment: Overall WFL for tasks assessed   Cervical / Trunk Assessment Cervical / Trunk Assessment: Normal   Communication Communication Communication: HOH   Cognition Arousal/Alertness: Awake/alert Behavior During Therapy: WFL for tasks assessed/performed Overall Cognitive Status: Within Functional Limits for tasks assessed  Home Living Family/patient expects to be discharged to:: Private residence Living Arrangements: Spouse/significant other Available Help at Discharge: Family;Available 24 hours/day Type of Home: House Home Access: Stairs to enter     Home Layout: Two level;Bed/bath upstairs Alternate Level Stairs-Number of Steps: flight Alternate Level Stairs-Rails: Left           Home Equipment: None          Prior Functioning/Environment Prior Level of Function : Independent/Modified Independent              Mobility Comments: independent ADLs Comments: independent                 OT Goals(Current goals can be found in the care plan section) Acute Rehab OT Goals Patient Stated Goal: to go home OT Goal Formulation: With patient Time For Goal Achievement: 05/14/22 Potential to Achieve Goals: Good  OT Frequency:         AM-PAC OT "6 Clicks" Daily Activity     Outcome Measure Help from another person eating meals?: None Help from another person taking care of personal grooming?: None Help from another person toileting, which includes using toliet, bedpan, or urinal?: None Help from another person bathing (including washing, rinsing, drying)?: None Help from another person to put on and taking off regular upper body clothing?: None Help from another person to put on and taking off regular lower body clothing?: None 6 Click Score: 24   End of Session Nurse Communication: Mobility status  Activity Tolerance: Patient tolerated treatment well Patient left: with call bell/phone within reach;in chair;with family/visitor present                   Time: 0953-1010 OT Time Calculation (min): 17 min Charges:  OT General Charges $OT Visit: 1 Visit OT Evaluation $OT Eval Low Complexity: 1 Low OT Treatments $Therapeutic Activity: 8-22 mins Darleen Crocker, MS, OTR/L , CBIS ascom [phone]  05/14/22, 12:44 PM

## 2022-05-14 NOTE — Progress Notes (Signed)
Patient transferred from ICU, wheeled by ICU RN to room 102, accompanied by daughter. Ambulated to bathroom. Offered pain medication, refused. Patient A+Ox4. VSS. Will continue to monitor and assess with Plan of Care.

## 2022-05-14 NOTE — TOC Initial Note (Signed)
Transition of Care [facility]) - Initial/Assessment Note    Patient Details  Name: [name] MRN: [id number] Date of Birth: 27-Jan-1946  Transition of [facility]) CM/SW Contact:    Shelbie Hutching, RN Phone Number: 05/14/2022, 4:10 PM  Clinical Narrative:                  Transition of Care [facility]) Screening Note   Patient Details  Name: [name] Date of Birth: [birth year]   Transition of Care [facility]) CM/SW Contact:    Shelbie Hutching, RN Phone Number: 05/14/2022, 4:10 PM    Transition of Care Department [facility]) has reviewed patient and no TOC needs have been identified at this time. We will continue to monitor patient advancement through interdisciplinary progression rounds. If new patient transition needs arise, please place a TOC consult.          Patient Goals and CMS Choice            Expected Discharge Plan and Services                                              Prior Living Arrangements/Services                       Activities of Daily Living      Permission Sought/Granted                  Emotional Assessment              Admission diagnosis:  Atherosclerosis of artery of extremity with ulceration ([place]) [I70.299, L97.909] Patient Active Problem List   Diagnosis Date Noted   Atherosclerosis of artery of extremity with ulceration ([place]) 05/13/2022   Atherosclerosis of native arteries of the extremities with ulceration ([place]) 02/22/2022   Osteoarthritis of right knee 11/17/2021   Plantar fasciitis of left foot 11/17/2021   Chronic obstructive pulmonary disease ([place]) 08/16/2021   Benign prostatic hyperplasia with urinary frequency 08/05/2020   OSA (obstructive sleep apnea) 12/30/2018   SOBOE (shortness of breath on exertion) 10/18/2017   Abnormal ECG 10/13/2017   Hyperlipidemia, mixed 10/13/2017   Lumbar radiculopathy 10/08/2017   Lumbosacral radiculitis 02/24/2017   Leg pain 02/24/2017   Cervical radiculitis  11/29/2015   Sprain of metacarpophalangeal joint 07/01/2015   Insomnia 01/23/2015   Prostate cancer ([place]) 01/23/2015   History of tobacco use 07/26/2009   PCP:  Birdie Sons, MD Pharmacy:   [id number] [place]Phone: [phone]: [phone]    Social Determinants of Health (SDOH) Social History: SDOH Screenings   Food Insecurity: No Food Insecurity (12/15/2021)  Housing: Low Risk  (12/15/2021)  Transportation Needs: No Transportation Needs (12/15/2021)  Alcohol Screen: Low Risk  (12/15/2021)  Depression (PHQ2-9): Low Risk  (12/15/2021)  Financial Resource Strain: Unknown (12/15/2021)  Physical Activity: Insufficiently Active (12/15/2021)  Social Connections: Socially Integrated (12/15/2021)  Stress: No Stress Concern Present (12/15/2021)  Tobacco Use: Medium Risk (05/14/2022)   SDOH Interventions:     Readmission Risk Interventions     No data to display

## 2022-05-14 NOTE — Progress Notes (Signed)
Patient visit  Patient is doing very well he notes some pain in his groin area otherwise he describes discomfort.  He maintains a 2+ palpable posterior tibial pulse with a hyperemic foot as is appropriate.  Patient seems to be ahead of schedule with respect to a femoral-tibial bypass.  I will plan to evaluate his incision tomorrow at lunchtime and there is a strong possibility we may be able to get him home tomorrow afternoon.

## 2022-05-14 NOTE — Progress Notes (Signed)
  Progress Note    05/14/2022 5:33 PM 1 Day Post-Op  Subjective:  [name] is a 77 y.o. male who presents with ulceration of the left lateral ankle.  Angiography has been performed and he is not a good candidate for intervention therefore bypass has been recommended. He is now post op left common femoral artery to left posterior tibial artery bypass with in situ great saphenous vein with vein patch angioplasty.   Upon exam this morning patient was sitting in a bedside chair.  Patient's left lower extremity had dressings that were clean dry and intact.  There were no signs or symptoms of seroma or hematoma.  Prevena wound VAC to the left groin was intact and working well.  Patient had palpable pulses posttibial.  Patient's left lower extremity was warm to the touch.  She denies any pain this morning even with ambulation.  Patient's Foley catheter was removed this morning.  No complications overnight and the patient's vitals remained stable.   Vitals:   05/14/22 1500 05/14/22 1634  BP: 132/66 (!) 146/74  Pulse: (!) 54 61  Resp:    Temp:  98.3 F (36.8 C)  SpO2: 100% 96%   Physical Exam: Cardiac:  RRR Lungs:  Clear throughout all fields  Incisions:  Clean dry and intact. Prevena Wound vac to left groin working well.  Extremities:  Warm to the touch with palpable post tibial pulses  Abdomen:  Soft, non-tender and non distended. Positive bowel sounds all quads.  Neurologic: AOX4. Appropriate affect.   CBC    Component Value Date/Time   WBC 13.9 (H) 05/14/2022 0005   RBC 4.27 05/14/2022 0005   HGB 13.4 05/14/2022 0005   HGB 16.2 08/13/2021 1127   HCT 39.7 05/14/2022 0005   HCT 46.7 08/13/2021 1127   PLT 177 05/14/2022 0005   PLT 175 08/13/2021 1127   MCV 93.0 05/14/2022 0005   MCV 95 08/13/2021 1127   MCH 31.4 05/14/2022 0005   MCHC 33.8 05/14/2022 0005   RDW 12.6 05/14/2022 0005   RDW 12.8 08/13/2021 1127    BMET    Component Value Date/Time   NA 135 05/14/2022  0005   K 4.3 05/14/2022 0005   CL 105 05/14/2022 0005   CO2 22 05/14/2022 0005   GLUCOSE 142 (H) 05/14/2022 0005   BUN 17 05/14/2022 0005   CREATININE 1.02 05/14/2022 0005   CALCIUM 8.3 (L) 05/14/2022 0005   GFRNONAA >60 05/14/2022 0005   GFRAA >60 10/09/2017 1931    INR No results found for: "INR"   Intake/Output Summary (Last 24 hours) at 05/14/2022 1733 Last data filed at 05/14/2022 1400 Gross per 24 hour  Intake 1620.38 ml  Output 2500 ml  Net -879.62 ml     Assessment/Plan:  77 y.o. male is s/p left common femoral artery to left posterior tibial artery bypass with in situ great saphenous vein with vein patch angioplasty.  1 Day Post-Op   Remove foley Catheter this morning and monitor due to HX of Prostate Cancer.  Recovering as expected from bypass surgery. No complications overnight and vitals all remain stable. Denies pain. Wound vac to groin to remain in place for 7 days.  Patient doing well enough to transfer to the floor later today.    Drema Pry [facility] 05/14/2022 5:33 PM

## 2022-05-15 ENCOUNTER — Telehealth (INDEPENDENT_AMBULATORY_CARE_PROVIDER_SITE_OTHER): Payer: Self-pay

## 2022-05-15 MED ORDER — MAGNESIUM HYDROXIDE 400 MG/5ML PO SUSP
30.0000 mL | Freq: Once | ORAL | Status: AC | PRN
  Administered 2022-05-15: 30 mL via ORAL
  Filled 2022-05-15: qty 30

## 2022-05-15 MED ORDER — MORPHINE SULFATE (PF) 2 MG/ML IV SOLN
2.0000 mg | INTRAVENOUS | Status: AC
  Administered 2022-05-15: 2 mg via INTRAVENOUS

## 2022-05-15 NOTE — Progress Notes (Signed)
Physical Therapy Treatment Patient Details Name: [name] MRN: [id number] DOB: June 26, 1945 Today's Date: 05/15/2022   History of Present Illness [name] is a 77 y.o. male who presents with ulceration of the Left lower extremity with severe atherosclerotic occlusive disease.  The patient required surgical bypass for revascularization. Patient is s/p Left femoral artery to posterior tibial artery bypass with in-situ saphenous vein graft  Patch angioplasty repair of the mid thigh saphenous vein with a piece of distal leg saphenous vein    PT Comments    Pt was pleasant and motivated to participate during the session and put forth good effort throughout. Pt required min increased time and effort with bed mobility and transfers but was generally steady without LOB.  Pt able to perform dynamic standing tasks with reaching outside BOS with no overt LOB.  Pt requested ambulation without an AD this session and was able to amb with fair cadence and min drifting left/right and with occasional reaching for hallway rail for support but no overt LOB.  Pt with increased effort, especially descending steps, during stair training with cues for step-to sequencing.  Pt will benefit from HHPT upon discharge to safely address deficits listed in patient problem list for decreased caregiver assistance and eventual return to PLOF.     Recommendations for follow up therapy are one component of a multi-disciplinary discharge planning process, led by the attending physician.  Recommendations may be updated based on patient status, additional functional criteria and insurance authorization.  Follow Up Recommendations  Home health PT     Assistance Recommended at Discharge Intermittent Supervision/Assistance  Patient can return home with the following Help with stairs or ramp for entrance;Assist for transportation;A little help with walking and/or transfers;A little help with bathing/dressing/bathroom    Equipment Recommendations  None recommended by PT (Pt stated has a walker at home)    Recommendations for Other Services       Precautions / Restrictions Precautions Precautions: Fall Restrictions Weight Bearing Restrictions: No     Mobility  Bed Mobility Overal bed mobility: Modified Independent             General bed mobility comments: Min extra time and effort    Transfers Overall transfer level: Needs assistance Equipment used: None Transfers: Sit to/from Stand Sit to Stand: Supervision           General transfer comment: Extra time/effort to come to standing but steady without LOB    Ambulation/Gait Ambulation/Gait assistance: Min guard Gait Distance (Feet): 200 Feet Assistive device: None Gait Pattern/deviations: Step-through pattern, Decreased stride length, Drifts right/left Gait velocity: Min decreased     General Gait Details: Min decreased cadence with mild drifting left/right and occsional reach for hallway railing for support but no overt LOB   Stairs Stairs: Yes Stairs assistance: Supervision Stair Management: Step to pattern, Forwards, Two rails, One rail Left Number of Stairs: 4 General stair comments: Pt able to amb up/down 4 steps with bilateral rails with mod multi-modal cuing for sequencing for step-to pattern and then again with one rail to simulate home setup; pt generally steady but with noted increased effort needed   Wheelchair Mobility    Modified Rankin (Stroke Patients Only)       Balance Overall balance assessment: Mild deficits observed, not formally tested  Cognition Arousal/Alertness: Awake/alert Behavior During Therapy: WFL for tasks assessed/performed Overall Cognitive Status: Within Functional Limits for tasks assessed                                          Exercises Total Joint Exercises Ankle Circles/Pumps: AROM,  Strengthening, Both, 5 reps, 10 reps Quad Sets: Strengthening, Both, 5 reps, 10 reps Gluteal Sets: Strengthening, Both, 5 reps, 10 reps Long Arc Quad: Strengthening, Both, 10 reps Other Exercises Other Exercises: Dynamic standing balance without UE support with reaching outside BOS    General Comments        Pertinent Vitals/Pain Pain Assessment Pain Assessment: No/denies pain    Home Living                          Prior Function            PT Goals (current goals can now be found in the care plan section) Progress towards PT goals: Progressing toward goals    Frequency    Min 2X/week      PT Plan Discharge plan needs to be updated    Co-evaluation              AM-PAC PT "6 Clicks" Mobility   Outcome Measure  Help needed turning from your back to your side while in a flat bed without using bedrails?: None Help needed moving from lying on your back to sitting on the side of a flat bed without using bedrails?: None Help needed moving to and from a bed to a chair (including a wheelchair)?: A Little Help needed standing up from a chair using your arms (e.g., wheelchair or bedside chair)?: A Little Help needed to walk in hospital room?: A Little Help needed climbing 3-5 steps with a railing? : A Little 6 Click Score: 20    End of Session Equipment Utilized During Treatment: Gait belt Activity Tolerance: Patient tolerated treatment well Patient left: in chair;with call bell/phone within reach Nurse Communication: Mobility status PT Visit Diagnosis: Other abnormalities of gait and mobility (R26.89);Difficulty in walking, not elsewhere classified (R26.2);Pain Pain - Right/Left: Left Pain - part of body:  (groin area secondary to tape per patient)     Time: 9024-0973 PT Time Calculation (min) (ACUTE ONLY): 23 min  Charges:  $Gait Training: 8-22 mins $Therapeutic Exercise: 8-22 mins                     D. Scott Jessen Siegman PT, DPT 05/15/22, 12:04  PM

## 2022-05-15 NOTE — Telephone Encounter (Signed)
I was asked by Dr. Delana Meyer to have contact home health and have the patient set up for a nurse to come out and help with dressing changes for the patient. I contacted Meg Wannemacher at [facility] and she will look into helping the patient.

## 2022-05-15 NOTE — TOC Progression Note (Signed)
Transition of Care [facility]) - Progression Note    Patient Details  Name: TESHAUN OLARTE MRN: [id number] Date of Birth: [birth year]  [facility]) CM/SW Contact  Gerilyn Pilgrim, LCSW Phone Number: 05/15/2022, 11:53 AM  Clinical Narrative:   Pt worked with patient, pt agreeable to [facility] PT. [facility] accepted referral for [facility] PT.          Expected Discharge Plan and Services                                               Social Determinants of Health (SDOH) Interventions SDOH Screenings   Food Insecurity: No Food Insecurity (12/15/2021)  Housing: Low Risk  (12/15/2021)  Transportation Needs: No Transportation Needs (12/15/2021)  Alcohol Screen: Low Risk  (12/15/2021)  Depression (PHQ2-9): Low Risk  (12/15/2021)  Financial Resource Strain: Unknown (12/15/2021)  Physical Activity: Insufficiently Active (12/15/2021)  Social Connections: Socially Integrated (12/15/2021)  Stress: No Stress Concern Present (12/15/2021)  Tobacco Use: Medium Risk (05/14/2022)    Readmission Risk Interventions     No data to display

## 2022-05-15 NOTE — Care Management Important Message (Signed)
Important Message  Patient Details  Name: [name] MRN: [id number] Date of Birth: [birth year]   Medicare Important Message Given:  N/A - LOS <3 / Initial given by admissions     [name] 05/15/2022, 8:21 AM

## 2022-05-15 NOTE — TOC Transition Note (Addendum)
Transition of Care [facility]) - CM/SW Discharge Note   Patient Details  Name: [name] MRN: [id number] Date of Birth: [birth year]  Transition of Care [facility]) CM/SW Contact:  Gerilyn Pilgrim, LCSW Phone Number: 05/15/2022, 3:08 PM   Clinical Narrative:   Per MD, pt will be discharging today. Home health arranged through [facility] PT/RN. No additional needs CSW signing off.     Final next level of care: [place] Barriers to Discharge: Barriers Resolved   Patient Goals and CMS Choice      Discharge Placement                         Discharge Plan and Services Additional resources added to the After Visit Summary for                              [facility] Agency: [facility] Date [facility] Agency Contacted: 05/15/22 Time [place]: 0307 Representative spoke with at [place]: [place] Determinants of Health ([place]) Interventions SDOH Screenings   Food Insecurity: No Food Insecurity (12/15/2021)  Housing: Low Risk  (12/15/2021)  Transportation Needs: No Transportation Needs (12/15/2021)  Alcohol Screen: Low Risk  (12/15/2021)  Depression (PHQ2-9): Low Risk  (12/15/2021)  Financial Resource Strain: Unknown (12/15/2021)  Physical Activity: Insufficiently Active (12/15/2021)  Social Connections: Socially Integrated (12/15/2021)  Stress: No Stress Concern Present (12/15/2021)  Tobacco Use: Medium Risk (05/14/2022)     Readmission Risk Interventions     No data to display

## 2022-05-16 MED ORDER — ATORVASTATIN CALCIUM 20 MG PO TABS: 20.0000 mg | ORAL_TABLET | Freq: Every evening | ORAL | 3 refills | Status: DC

## 2022-05-16 MED ORDER — CLOPIDOGREL BISULFATE 75 MG PO TABS: 75.0000 mg | ORAL_TABLET | Freq: Every day | ORAL | 3 refills | Status: DC

## 2022-05-16 MED ORDER — BISACODYL 5 MG PO TBEC
10.0000 mg | DELAYED_RELEASE_TABLET | Freq: Every day | ORAL | Status: DC | PRN
  Filled 2022-05-16: qty 2

## 2022-05-16 NOTE — Progress Notes (Signed)
Subjective: Interval History: none..  Does have some bleeding from the saphenectomy incision and patch angioplasty site in the mid thigh underneath the honeycomb dressing.  No bowel movement.  Objective: Vital signs in last 24 hours: Temp:  [97.9 F (36.6 C)-98.6 F (37 C)] 98.6 F (37 C) (01/06 0733) Pulse Rate:  [64-97] 65 (01/06 0733) Resp:  [15-18] 16 (01/06 0733) BP: (113-146)/(56-76) 113/56 (01/06 0733) SpO2:  [94 %-95 %] 95 % (01/06 0733)  Intake/Output from previous day: 01/05 0701 - 01/06 0700 In: 550 [P.O.:550] Out: 475 [Urine:475] Intake/Output this shift: No intake/output data recorded.  General appearance: alert, cooperative, and no distress Pulses: Left Pulses: PT: present 1+ Incision/Wound: Clean, some bruising, some bleeding from my subcutaneous capillary, this ceased with pressure held.  Lab Results: Recent Labs    05/14/22 0005  WBC 13.9*  HGB 13.4  HCT 39.7  PLT 177   BMET Recent Labs    05/14/22 0005  NA 135  K 4.3  CL 105  CO2 22  GLUCOSE 142*  BUN 17  CREATININE 1.02  CALCIUM 8.3*    Studies/Results: No results found. Anti-infectives: Anti-infectives (From admission, onward)    Start     Dose/Rate Route Frequency Ordered Stop   05/13/22 1700  ceFAZolin (ANCEF) IVPB 2g/100 mL premix        2 g 200 mL/hr over 30 Minutes Intravenous Every 8 hours 05/13/22 1611 05/14/22 1109   05/13/22 0600  vancomycin (VANCOREADY) IVPB 1500 mg/300 mL        1,500 mg 150 mL/hr over 120 Minutes Intravenous On call to O.R. 05/12/22 2241 05/13/22 0923       Assessment/Plan: s/p Procedure(s): BYPASS GRAFT FEMORAL-TIBIAL ARTERY (POST TIBIAL) (Left) APPLICATION OF CELL SAVER (Left) Postop day 3 left femoral posterior tibial in situ greater saphenous vein bypass with patch angioplasty of the mid segment: He has still not had a bowel movement.  He is in no distress.  He says he is not urinating as much as he has from either.  Pressure was held for the  bleeding capillary at the mid thigh, this stopped.  I will write orders for iodine on a twice daily basis to the wound to be covered then with dry gauze, and gave another Dulcolax.  I will see him later today to see if he is able to be discharged.  He certainly looks clinically.  His family does have a little bit of trepidation about him going home, he acknowledges this.   LOS: 3 days   Charmain Diosdado 05/16/2022, 9:05 AM

## 2022-05-16 NOTE — Discharge Summary (Signed)
Physician Discharge Summary  Patient ID: [name] MRN: [id number] DOB/AGE: 02/07/1946 77 y.o.  Admit date: 05/13/2022 Discharge date: 05/16/2022  Admission Diagnoses:  Discharge Diagnoses:  Principal Problem:   Atherosclerosis of artery of extremity with ulceration (HCC) of the left lateral malleolus   Discharged Condition: good  Hospital Course: [name] was admitted to the hospital as a same day after undergoing on 13 May 2022 a left common femoral to posterior tibial in situ greater saphenous vein bypass with valvulotomy.  He did require patch angioplasty of the mid segment portion of this vein that was deemed somewhat insufficient for use initially.  He did well, was transferred to the floor.  Mobilization was initiated.  He had a good pulse in the posterior tibial.  He did have some drainage in the Prevena, this was changed to a honeycomb dressing.  Pain was well-controlled.  He did not have a bowel movement until the morning of the sixth, although his activity was steadily increased, and his level of discomfort did not require significant narcotic-based pain medication.  Incision currently has oozing slightly serous fluid, but no purulence, no odor.  He wants to go home.  Consults: None  Significant Diagnostic Studies: None other than labs  Treatments: surgery: Left common femoral to posterior tibial in situ greater saphenous vein bypass with vein patch angioplasty, application of Prevena, intraoperative ultrasound  Discharge Exam: Blood pressure (!) 113/56, pulse 65, temperature 98.6 F (37 C), resp. rate 16, height '6\' 1"'$  (1.854 m), weight 99.8 kg, SpO2 95 %. General appearance: alert, cooperative, and no distress  Vital signs are stable.  Left leg is slightly swollen, not uncommon for postop day 3.  He has some slight serous drainage.  He has a good pulse.  Color is good with brisk capillary refill.  Ambulation is good with 3 circumferential trips around the  nursing station without difficulty.  He had a bowel movement.  He is urinating without difficulty.  Disposition:   Discharge Instructions     Home Health   Complete by: As directed    To provide the following care/treatments: RN        [place], [facility] Follow up.   Specialty: Home Health Services Why: [facility] with follow up with you for your home health needs. If you have questions the liasions number directly is [phone]. Start of care will be monday, 1/8. Contact information: [address] [place] [address] [phone]                 Signed: Zara Chess 05/16/2022, 12:01 PM

## 2022-05-16 NOTE — TOC Transition Note (Signed)
Transition of Care [facility]) - CM/SW Discharge Note   Patient Details  Name: [name] MRN: [id number] Date of Birth: Dec 27, 1945  Transition of Care [facility]) CM/SW Contact:  Magnus Ivan, LCSW Phone Number: 05/16/2022, 11:55 AM   Clinical Narrative:    Patient to DC home today. CSW called Desiree with [facility], notified her of DC and confirmed they can follow for PT and RN services at home.    Final next level of care: [place] Barriers to Discharge: Barriers Resolved   Patient Goals and CMS Choice      Discharge Placement                         Discharge Plan and Services Additional resources added to the After Visit Summary for                            HH Arranged: RN, PT [facility] Agency: [facility] Date [place]: 05/16/22 Time [place]: 0307 Representative spoke with at [place]: [place] Determinants of Health ([place]) Interventions SDOH Screenings   Food Insecurity: No Food Insecurity (12/15/2021)  Housing: Low Risk  (12/15/2021)  Transportation Needs: No Transportation Needs (12/15/2021)  Alcohol Screen: Low Risk  (12/15/2021)  Depression (PHQ2-9): Low Risk  (12/15/2021)  Financial Resource Strain: Unknown (12/15/2021)  Physical Activity: Insufficiently Active (12/15/2021)  Social Connections: Socially Integrated (12/15/2021)  Stress: No Stress Concern Present (12/15/2021)  Tobacco Use: Medium Risk (05/14/2022)     Readmission Risk Interventions     No data to display

## 2022-05-16 NOTE — Progress Notes (Signed)
Mobility Specialist - Progress Note   05/16/22 1027  Mobility  Activity Ambulated with assistance in hallway  Level of Assistance Standby assist, set-up cues, supervision of patient - no hands on  Assistive Device None  Distance Ambulated (ft) 480 ft  Activity Response Tolerated well  Mobility Referral Yes  $Mobility charge 1 Mobility   Pt sitting in recliner on RA upon arrival. Pt STS and ambulates 3 laps around NS SBA with no LOB noted. Pt returns to recliner with needs in reach.   Gretchen Short  Mobility Specialist  05/16/22 10:28 AM

## 2022-05-16 NOTE — Progress Notes (Signed)
Patient's only complaint this morning was constipation, patient was given a laxative. Patient had BM around lunch time. MD made aware. Honey comb dressing was removed by MD. RN cleaned the surgical incision site and dressed with dry gauze. No drainage. Patient with no pain, DC instructions reviewed, wife called to pick up.

## 2022-05-18 ENCOUNTER — Other Ambulatory Visit (INDEPENDENT_AMBULATORY_CARE_PROVIDER_SITE_OTHER): Payer: Self-pay | Admitting: Nurse Practitioner

## 2022-05-18 DIAGNOSIS — M5412 Radiculopathy, cervical region: Secondary | ICD-10-CM | POA: Diagnosis not present

## 2022-05-18 DIAGNOSIS — I70203 Unspecified atherosclerosis of native arteries of extremities, bilateral legs: Secondary | ICD-10-CM | POA: Diagnosis not present

## 2022-05-18 DIAGNOSIS — Z951 Presence of aortocoronary bypass graft: Secondary | ICD-10-CM | POA: Diagnosis not present

## 2022-05-18 DIAGNOSIS — C61 Malignant neoplasm of prostate: Secondary | ICD-10-CM | POA: Diagnosis not present

## 2022-05-18 DIAGNOSIS — Z48812 Encounter for surgical aftercare following surgery on the circulatory system: Secondary | ICD-10-CM | POA: Diagnosis not present

## 2022-05-18 DIAGNOSIS — J449 Chronic obstructive pulmonary disease, unspecified: Secondary | ICD-10-CM | POA: Diagnosis not present

## 2022-05-18 DIAGNOSIS — Z87891 Personal history of nicotine dependence: Secondary | ICD-10-CM | POA: Diagnosis not present

## 2022-05-18 DIAGNOSIS — M1711 Unilateral primary osteoarthritis, right knee: Secondary | ICD-10-CM | POA: Diagnosis not present

## 2022-05-18 DIAGNOSIS — Z9842 Cataract extraction status, left eye: Secondary | ICD-10-CM | POA: Diagnosis not present

## 2022-05-18 DIAGNOSIS — Z792 Long term (current) use of antibiotics: Secondary | ICD-10-CM | POA: Diagnosis not present

## 2022-05-18 DIAGNOSIS — Z7902 Long term (current) use of antithrombotics/antiplatelets: Secondary | ICD-10-CM | POA: Diagnosis not present

## 2022-05-18 DIAGNOSIS — K5790 Diverticulosis of intestine, part unspecified, without perforation or abscess without bleeding: Secondary | ICD-10-CM | POA: Diagnosis not present

## 2022-05-18 DIAGNOSIS — Z85828 Personal history of other malignant neoplasm of skin: Secondary | ICD-10-CM | POA: Diagnosis not present

## 2022-05-18 DIAGNOSIS — E782 Mixed hyperlipidemia: Secondary | ICD-10-CM | POA: Diagnosis not present

## 2022-05-18 DIAGNOSIS — M5416 Radiculopathy, lumbar region: Secondary | ICD-10-CM | POA: Diagnosis not present

## 2022-05-18 DIAGNOSIS — Z8701 Personal history of pneumonia (recurrent): Secondary | ICD-10-CM | POA: Diagnosis not present

## 2022-05-18 DIAGNOSIS — G4733 Obstructive sleep apnea (adult) (pediatric): Secondary | ICD-10-CM | POA: Diagnosis not present

## 2022-05-18 DIAGNOSIS — Z9181 History of falling: Secondary | ICD-10-CM | POA: Diagnosis not present

## 2022-05-18 MED ORDER — OXYCODONE HCL 5 MG PO TABS: 5.0000 mg | ORAL_TABLET | Freq: Four times a day (QID) | ORAL | 0 refills | Status: AC | PRN

## 2022-05-20 ENCOUNTER — Telehealth (INDEPENDENT_AMBULATORY_CARE_PROVIDER_SITE_OTHER): Payer: Self-pay

## 2022-05-20 ENCOUNTER — Telehealth: Payer: Self-pay

## 2022-05-20 NOTE — Telephone Encounter (Signed)
AnnaLisa a nurse with [facility] would like to verify wound care orders.  Please advise.  Ok to leave message.

## 2022-05-20 NOTE — Telephone Encounter (Signed)
Copied from [place] [id number]. Topic: Quick Communication - Home Health Verbal Orders >> May 20, 2022 10:04 AM Everette C wrote: Caller/Agency: Jessy Oto / [facility]  Callback Number: [phone] Requesting OT/PT/Skilled Nursing/Social Work/Speech Therapy: skilled nursing  Frequency: 2w2 1w2 1 every other w 4, 2 prn

## 2022-05-21 NOTE — Telephone Encounter (Signed)
Verbal given for continued wound care with betadine and gauze per Arna Medici.

## 2022-05-21 NOTE — Telephone Encounter (Signed)
What are his current wound care orders?

## 2022-05-21 NOTE — Telephone Encounter (Signed)
Left message for more details.

## 2022-05-22 ENCOUNTER — Telehealth (INDEPENDENT_AMBULATORY_CARE_PROVIDER_SITE_OTHER): Payer: Self-pay | Admitting: Vascular Surgery

## 2022-05-22 DIAGNOSIS — M1711 Unilateral primary osteoarthritis, right knee: Secondary | ICD-10-CM | POA: Diagnosis not present

## 2022-05-22 DIAGNOSIS — M5416 Radiculopathy, lumbar region: Secondary | ICD-10-CM | POA: Diagnosis not present

## 2022-05-22 DIAGNOSIS — Z48812 Encounter for surgical aftercare following surgery on the circulatory system: Secondary | ICD-10-CM | POA: Diagnosis not present

## 2022-05-22 DIAGNOSIS — J449 Chronic obstructive pulmonary disease, unspecified: Secondary | ICD-10-CM | POA: Diagnosis not present

## 2022-05-22 DIAGNOSIS — I70203 Unspecified atherosclerosis of native arteries of extremities, bilateral legs: Secondary | ICD-10-CM | POA: Diagnosis not present

## 2022-05-22 DIAGNOSIS — C61 Malignant neoplasm of prostate: Secondary | ICD-10-CM | POA: Diagnosis not present

## 2022-05-22 NOTE — Telephone Encounter (Signed)
Patients wife called in stating that she having issues and is overwhelmed with cleaning the patients wounds. Patient has home health coming out to the house x2 but is asking if they can come 3x a week because the wound care from d/c paperwork states the wounds needs to be cleaned x2 a day and she ( the patients wife) doesn't feel comfortable with doing so ,.  Patients wife is expecting a call back from nurse or provider because she feels as if she isn't caring for the patient properly and home health only coming x2 a week isn't properly helping either and the patient will get an infection      Please call and advise

## 2022-05-22 NOTE — Telephone Encounter (Signed)
I spoke with [facility] and physical therapy will be starting on Sunday. Skilled nursing will be done twice a week for wound care. Patient spouse was made aware with orders.

## 2022-05-24 DIAGNOSIS — Z48812 Encounter for surgical aftercare following surgery on the circulatory system: Secondary | ICD-10-CM | POA: Diagnosis not present

## 2022-05-24 DIAGNOSIS — M1711 Unilateral primary osteoarthritis, right knee: Secondary | ICD-10-CM | POA: Diagnosis not present

## 2022-05-24 DIAGNOSIS — M5416 Radiculopathy, lumbar region: Secondary | ICD-10-CM | POA: Diagnosis not present

## 2022-05-24 DIAGNOSIS — J449 Chronic obstructive pulmonary disease, unspecified: Secondary | ICD-10-CM | POA: Diagnosis not present

## 2022-05-24 DIAGNOSIS — C61 Malignant neoplasm of prostate: Secondary | ICD-10-CM | POA: Diagnosis not present

## 2022-05-24 DIAGNOSIS — I70203 Unspecified atherosclerosis of native arteries of extremities, bilateral legs: Secondary | ICD-10-CM | POA: Diagnosis not present

## 2022-05-26 ENCOUNTER — Telehealth (INDEPENDENT_AMBULATORY_CARE_PROVIDER_SITE_OTHER): Payer: Self-pay

## 2022-05-26 DIAGNOSIS — Z48812 Encounter for surgical aftercare following surgery on the circulatory system: Secondary | ICD-10-CM | POA: Diagnosis not present

## 2022-05-26 DIAGNOSIS — C61 Malignant neoplasm of prostate: Secondary | ICD-10-CM | POA: Diagnosis not present

## 2022-05-26 DIAGNOSIS — M5416 Radiculopathy, lumbar region: Secondary | ICD-10-CM | POA: Diagnosis not present

## 2022-05-26 DIAGNOSIS — J449 Chronic obstructive pulmonary disease, unspecified: Secondary | ICD-10-CM | POA: Diagnosis not present

## 2022-05-26 DIAGNOSIS — I70203 Unspecified atherosclerosis of native arteries of extremities, bilateral legs: Secondary | ICD-10-CM | POA: Diagnosis not present

## 2022-05-26 DIAGNOSIS — M1711 Unilateral primary osteoarthritis, right knee: Secondary | ICD-10-CM | POA: Diagnosis not present

## 2022-05-27 NOTE — Telephone Encounter (Signed)
Given the surgery that the patient had, swelling is to be expected.  The patient is advised to elevate his lower extremity when not active as well as a Ace bandage can be lightly wrapped around the area to help with swelling.  As far as the drainage this is likely related to serous fluid due to the swelling itself.  Given the redness we will send an antibiotic in order to ensure there is no development of cellulitis given his swelling.

## 2022-05-27 NOTE — Telephone Encounter (Signed)
[name] home health nurse was made aware with medical recommendations and also she requested verbal orders for nurse visit today for the patient. Verbal orders are fine for nurse visit. Patient was made aware that medical recommendations and informed that Doxycyline 100 mg twice a day for 10 days would be called into pharmacy.

## 2022-05-29 DIAGNOSIS — M1711 Unilateral primary osteoarthritis, right knee: Secondary | ICD-10-CM | POA: Diagnosis not present

## 2022-05-29 DIAGNOSIS — C61 Malignant neoplasm of prostate: Secondary | ICD-10-CM | POA: Diagnosis not present

## 2022-05-29 DIAGNOSIS — J449 Chronic obstructive pulmonary disease, unspecified: Secondary | ICD-10-CM | POA: Diagnosis not present

## 2022-05-29 DIAGNOSIS — M5416 Radiculopathy, lumbar region: Secondary | ICD-10-CM | POA: Diagnosis not present

## 2022-05-29 DIAGNOSIS — I70203 Unspecified atherosclerosis of native arteries of extremities, bilateral legs: Secondary | ICD-10-CM | POA: Diagnosis not present

## 2022-05-29 DIAGNOSIS — Z48812 Encounter for surgical aftercare following surgery on the circulatory system: Secondary | ICD-10-CM | POA: Diagnosis not present

## 2022-06-01 DIAGNOSIS — M1711 Unilateral primary osteoarthritis, right knee: Secondary | ICD-10-CM | POA: Diagnosis not present

## 2022-06-01 DIAGNOSIS — Z48812 Encounter for surgical aftercare following surgery on the circulatory system: Secondary | ICD-10-CM | POA: Diagnosis not present

## 2022-06-01 DIAGNOSIS — C61 Malignant neoplasm of prostate: Secondary | ICD-10-CM | POA: Diagnosis not present

## 2022-06-01 DIAGNOSIS — I70203 Unspecified atherosclerosis of native arteries of extremities, bilateral legs: Secondary | ICD-10-CM | POA: Diagnosis not present

## 2022-06-01 DIAGNOSIS — M5416 Radiculopathy, lumbar region: Secondary | ICD-10-CM | POA: Diagnosis not present

## 2022-06-01 DIAGNOSIS — J449 Chronic obstructive pulmonary disease, unspecified: Secondary | ICD-10-CM | POA: Diagnosis not present

## 2022-06-02 DIAGNOSIS — M5416 Radiculopathy, lumbar region: Secondary | ICD-10-CM | POA: Diagnosis not present

## 2022-06-02 DIAGNOSIS — Z48812 Encounter for surgical aftercare following surgery on the circulatory system: Secondary | ICD-10-CM | POA: Diagnosis not present

## 2022-06-02 DIAGNOSIS — M1711 Unilateral primary osteoarthritis, right knee: Secondary | ICD-10-CM | POA: Diagnosis not present

## 2022-06-02 DIAGNOSIS — C61 Malignant neoplasm of prostate: Secondary | ICD-10-CM | POA: Diagnosis not present

## 2022-06-02 DIAGNOSIS — I70203 Unspecified atherosclerosis of native arteries of extremities, bilateral legs: Secondary | ICD-10-CM | POA: Diagnosis not present

## 2022-06-02 DIAGNOSIS — J449 Chronic obstructive pulmonary disease, unspecified: Secondary | ICD-10-CM | POA: Diagnosis not present

## 2022-06-02 NOTE — Progress Notes (Signed)
    Patient ID: [name], male   DOB: Oct 31, 1945, 77 y.o.   MRN: [id number]  No chief complaint on file.   HPI [name] is a 77 y.o. male.    No significant c/o walking well   Past Medical History:  Diagnosis Date   Abnormal prostate specific antigen 01/23/2015   Acute bacterial sinusitis 01/23/2015   Basal cell carcinoma 03/18/2020   right cheek, excised 06/04/20   Cannot sleep 01/23/2015   Disease caused by virus 01/23/2015   Diverticulitis 01/23/2015   Diverticulosis    History of tobacco use 07/26/2009   PNA (pneumonia) 01/23/2015   Prostate cancer ([name])    observing   Severe obstructive sleep apnea 01/23/2015    Past Surgical History:  Procedure Laterality Date   APPENDECTOMY     BACK SURGERY     CATARACT EXTRACTION Left    COLONOSCOPY     FEMORAL-TIBIAL BYPASS GRAFT Left 05/13/2022   Procedure: BYPASS GRAFT FEMORAL-TIBIAL ARTERY (POST TIBIAL);  Surgeon: Christopher Cabal, MD;  Location: [facility] ORS;  Service: Vascular;  Laterality: Left;   LEG SURGERY Right    meniscus repair   LOWER EXTREMITY ANGIOGRAPHY Left 03/24/2022   Procedure: Lower Extremity Angiography;  Surgeon: Christopher Cabal, MD;  Location: [facility] CV LAB;  Service: Cardiovascular;  Laterality: Left;      Allergies  Allergen Reactions   Penicillins Hives    Current Outpatient Medications  Medication Sig Dispense Refill   albuterol (VENTOLIN HFA) 108 (90 Base) MCG/ACT inhaler TAKE 2 PUFFS BY MOUTH EVERY 6 HOURS AS NEEDED FOR WHEEZE OR SHORTNESS OF BREATH (Patient not taking: Reported on 04/27/2022) 8.5 each 0   ALPRAZolam (XANAX) 1 MG tablet TAKE 1/2-1 TABLET BY MOUTH AT BEDTIME 30 tablet 3   ANORO ELLIPTA 62.5-25 MCG/ACT AEPB INHALE 1 PUFF BY MOUTH EVERY DAY 60 each 12   atorvastatin (LIPITOR) 20 MG tablet Take 1 tablet (20 mg total) by mouth every evening. 30 tablet 3   clopidogrel (PLAVIX) 75 MG tablet Take 1 tablet (75 mg total) by mouth daily. 30 tablet 3    doxycycline (VIBRAMYCIN) 50 MG capsule TAKE 1 CAPSULE BY MOUTH EVERY DAY 30 capsule 9   meloxicam (MOBIC) 15 MG tablet Take 15 mg by mouth daily.     metroNIDAZOLE (METROGEL) 1 % gel Apply 1 Application topically as needed. (Patient not taking: Reported on 05/14/2022)     Naproxen Sodium (ALEVE) 220 MG CAPS Take 440 mg by mouth daily.     No current facility-administered medications for this visit.        Physical Exam There were no vitals taken for this visit. Gen:  WD/WN, NAD Skin: incision C/D/I; staples removed Vasc:  2+ pt pulse left and 2-3+ edema    Assessment/Plan: 1. Atherosclerosis of artery of extremity with ulceration ([place]) Doing well will start out patient PT - VAS [place]; Future - VAS [place] ABI WITH/WO TBI; Future      [name] 06/02/2022, 1:46 PM   This note was created with Dragon medical transcription system.  Any errors from dictation are unintentional.

## 2022-06-04 ENCOUNTER — Encounter (INDEPENDENT_AMBULATORY_CARE_PROVIDER_SITE_OTHER): Payer: Self-pay | Admitting: Vascular Surgery

## 2022-06-04 ENCOUNTER — Ambulatory Visit (INDEPENDENT_AMBULATORY_CARE_PROVIDER_SITE_OTHER): Payer: Medicare Other | Admitting: Vascular Surgery

## 2022-06-04 VITALS — BP 128/75 | HR 96 | Ht 73.0 in | Wt 220.0 lb

## 2022-06-04 DIAGNOSIS — I70299 Other atherosclerosis of native arteries of extremities, unspecified extremity: Secondary | ICD-10-CM

## 2022-06-04 DIAGNOSIS — L97909 Non-pressure chronic ulcer of unspecified part of unspecified lower leg with unspecified severity: Secondary | ICD-10-CM

## 2022-06-05 ENCOUNTER — Telehealth: Payer: Self-pay | Admitting: Family Medicine

## 2022-06-05 NOTE — Telephone Encounter (Signed)
Horris Latino calling from [facility] is calling to verify per the patient that Dr. Caryn Section has released him from PT. Please advse CB- [phone] Verbal on VM.

## 2022-06-06 ENCOUNTER — Encounter (INDEPENDENT_AMBULATORY_CARE_PROVIDER_SITE_OTHER): Payer: Self-pay | Admitting: Vascular Surgery

## 2022-06-08 NOTE — Telephone Encounter (Signed)
[name] from [facility] called to check status of request from Friday morning she says.

## 2022-06-09 NOTE — Telephone Encounter (Signed)
I don't know anything about this. I haven't order PT to be discontinued... maybe the patient just refused it or something.

## 2022-06-10 NOTE — Telephone Encounter (Signed)
Advised bonnie. She states the patient also said that he wanted to stop but she would let the office know you have not discontinued PT

## 2022-06-17 ENCOUNTER — Encounter: Payer: Self-pay | Admitting: Dermatology

## 2022-06-17 ENCOUNTER — Ambulatory Visit (INDEPENDENT_AMBULATORY_CARE_PROVIDER_SITE_OTHER): Payer: Medicare Other | Admitting: Dermatology

## 2022-06-17 VITALS — BP 118/67 | HR 70

## 2022-06-17 DIAGNOSIS — I70299 Other atherosclerosis of native arteries of extremities, unspecified extremity: Secondary | ICD-10-CM

## 2022-06-17 DIAGNOSIS — L820 Inflamed seborrheic keratosis: Secondary | ICD-10-CM

## 2022-06-17 DIAGNOSIS — L905 Scar conditions and fibrosis of skin: Secondary | ICD-10-CM

## 2022-06-17 DIAGNOSIS — L97909 Non-pressure chronic ulcer of unspecified part of unspecified lower leg with unspecified severity: Secondary | ICD-10-CM | POA: Diagnosis not present

## 2022-06-17 DIAGNOSIS — B028 Zoster with other complications: Secondary | ICD-10-CM

## 2022-06-17 MED ORDER — VALACYCLOVIR HCL 1 G PO TABS: 1000.0000 mg | ORAL_TABLET | Freq: Three times a day (TID) | ORAL | 0 refills | Status: AC

## 2022-06-17 NOTE — Patient Instructions (Addendum)
Cryotherapy Aftercare  Wash gently with soap and water everyday.   Apply Vaseline and Band-Aid daily until healed.   Recommend Serica scar gel for the scar on Left lower leg    Due to recent changes in healthcare laws, you may see results of your pathology and/or laboratory studies on MyChart before the doctors have had a chance to review them. We understand that in some cases there may be results that are confusing or concerning to you. Please understand that not all results are received at the same time and often the doctors may need to interpret multiple results in order to provide you with the best plan of care or course of treatment. Therefore, we ask that you please give [place] 2 business days to thoroughly review all your results before contacting the office for clarification. Should we see a critical lab result, you will be contacted sooner.   If You Need Anything After Your Visit  If you have any questions or concerns for your doctor, please call our main line at [phone] and press option 4 to reach your doctor's medical assistant. If no one answers, please leave a voicemail as directed and we will return your call as soon as possible. Messages left after 4 pm will be answered the following business day.   You may also send [place] a message via [place]. We typically respond to MyChart messages within 1-2 business days.  For prescription refills, please ask your pharmacy to contact our office. Our fax number is [phone].  If you have an urgent issue when the clinic is closed that cannot wait until the next business day, you can page your doctor at the number below.    Please note that while we do our best to be available for urgent issues outside of office hours, we are not available 24/7.   If you have an urgent issue and are unable to reach [place], you may choose to seek medical care at your doctor's office, retail clinic, urgent care center, or emergency room.  If you have a medical  emergency, please immediately call 911 or go to the emergency department.  Pager Numbers  - Dr. Nehemiah Massed: [phone]  - Dr. Laurence Ferrari: [phone]  - Dr. Nicole Kindred: [phone]  In the event of inclement weather, please call our main line at [phone] for an update on the status of any delays or closures.  Dermatology Medication Tips: Please keep the boxes that topical medications come in in order to help keep track of the instructions about where and how to use these. Pharmacies typically print the medication instructions only on the boxes and not directly on the medication tubes.   If your medication is too expensive, please contact our office at [phone] option 4 or send [place] a message through [place].   We are unable to tell what your co-pay for medications will be in advance as this is different depending on your insurance coverage. However, we may be able to find a substitute medication at lower cost or fill out paperwork to get insurance to cover a needed medication.   If a prior authorization is required to get your medication covered by your insurance company, please allow [place] 1-2 business days to complete this process.  Drug prices often vary depending on where the prescription is filled and some pharmacies may offer cheaper prices.  The website www.goodrx.com contains coupons for medications through different pharmacies. The prices here do not account for what the cost may be with help from  insurance (it may be cheaper with your insurance), but the website can give you the price if you did not use any insurance.  - You can print the associated coupon and take it with your prescription to the pharmacy.  - You may also stop by our office during regular business hours and pick up a GoodRx coupon card.  - If you need your prescription sent electronically to a different pharmacy, notify our office through [facility] or by phone at [phone] option 4.     Si Usted  Necesita Algo Despus de Su Visita  Tambin puede enviarnos un mensaje a travs de Pharmacist, community. Por lo general respondemos a los mensajes de MyChart en el transcurso de 1 a 2 das hbiles.  Para renovar recetas, por favor pida a su farmacia que se ponga en contacto con nuestra oficina. Harland Dingwall de fax es [place] [phone].  Si tiene un asunto urgente cuando la clnica est cerrada y que no puede esperar hasta el siguiente da hbil, puede llamar/localizar a su doctor(a) al nmero que aparece a continuacin.   Por favor, tenga en cuenta que aunque hacemos todo lo posible para estar disponibles para asuntos urgentes fuera del horario de [place], no estamos disponibles las 24 horas del da, los 7 das de la [place].   Si tiene un problema urgente y no puede comunicarse con nosotros, puede optar por buscar atencin mdica  en el consultorio de su doctor(a), en una clnica privada, en un centro de atencin urgente o en una sala de emergencias.  Si tiene Engineering geologist, por favor llame inmediatamente al 911 o vaya a la sala de emergencias.  Nmeros de bper  - Dr. Nehemiah Massed: [phone]  - Dra. Moye: [phone]  - Dra. Nicole Kindred: [phone]  En caso de inclemencias del [place], por favor llame a Johnsie Kindred principal al [phone] para una actualizacin sobre el [place] de cualquier retraso o cierre.  Consejos para la medicacin en dermatologa: Por favor, guarde las cajas en las que vienen los medicamentos de uso tpico para ayudarle a seguir las instrucciones sobre dnde y cmo usarlos. Las farmacias generalmente imprimen las instrucciones del medicamento slo en las cajas y no directamente en los tubos del [place].   Si su medicamento es muy caro, por favor, pngase en contacto con Zigmund Daniel llamando al [phone] y presione la opcin 4 o envenos un mensaje a travs de Pharmacist, community.   No podemos decirle cul ser su copago por los medicamentos por adelantado ya que esto es  diferente dependiendo de la cobertura de su seguro. Sin embargo, es posible que podamos encontrar un medicamento sustituto a Electrical engineer un formulario para que el seguro cubra el medicamento que se considera necesario.   Si se requiere una autorizacin previa para que su compaa de seguros [place] su medicamento, por favor permtanos de 1 a 2 das hbiles para completar este proceso.  Los precios de los medicamentos varan con frecuencia dependiendo del Environmental consultant de dnde se surte la receta y alguna farmacias pueden ofrecer precios ms baratos.  El sitio web www.goodrx.com tiene cupones para medicamentos de Airline pilot. Los precios aqu no tienen en cuenta lo que podra costar con la ayuda del seguro (puede ser ms barato con su seguro), pero el sitio web puede darle el precio si no utiliz Research scientist (physical sciences).  - Puede imprimir el cupn correspondiente y llevarlo con su receta a la farmacia.  - Tambin puede pasar por nuestra oficina durante el horario de  atencin regular y Charity fundraiser una tarjeta de cupones de GoodRx.  - Si necesita que su receta se enve electrnicamente a una farmacia diferente, informe a nuestra oficina a travs de MyChart de [place] o por telfono llamando al [phone] y presione la opcin 4.

## 2022-06-17 NOTE — Progress Notes (Signed)
   Follow-Up Visit   Subjective  [name] is a 77 y.o. male who presents for the following: Rash (R temple/forehead, scalp, 4-5 days, painful, itching, started out as blisters, tried otc HC cream, vision is a little blury) and check growth (R tricep, 1 yr, irritated). Also with scar from bypass surgery of leg - wants checked.  The following portions of the chart were reviewed this encounter and updated as appropriate:   Tobacco  Allergies  Meds  Problems  Med Hx  Surg Hx  Fam Hx     Review of Systems:  No other skin or systemic complaints except as noted in HPI or Assessment and Plan.  Objective  Well appearing patient in no apparent distress; mood and affect are within normal limits.  A focused examination was performed including face. Scalp, R arm. Relevant physical exam findings are noted in the Assessment and Plan.  R tricep x 1 Stuck on waxy paps with erythema  L lower leg Linear scar  face, scalp Erythema and edema R upper eyelid, erythema and crusting R forehead      Assessment & Plan   Herpes zoster complicated With eye symptoms face, scalp, eye  Discussed viral etiology (herpes zoster) and risk of contagion. Before blisters dry up and heal, shingles rash can cause chickenpox in people who have never had it or have never been vaccinated against it, if they are exposed to the virus.  Keep area covered.  Avoid contact with pregnant women. Shingles can also cause significant pain or unusual skin sensations (post-herpetic neuralgia) which may persist after rash has resolved.  Shingles rash may leave dyspigmented scars on skin once healed.  Labs from 05/14/22 viewed - kidneys OK  Start Valtrex 1g tid for 2 wks, recommend drinking plenty of fluids and staying hydrated  Recommend pt seeing Ophthalmologist ASAP, scheduled pt with [facility] Dr. Wallace Going 06/18/22 at 1:40.  valACYclovir (VALTREX) 1000 MG tablet - face, scalp Take 1 tablet (1,000 mg  total) by mouth 3 (three) times daily for 14 days. Take plenty of fluids  Related Procedures Ambulatory referral to Ophthalmology  Inflamed seborrheic keratosis R tricep x 1 Vs Wart Symptomatic, irritating, patient would like treated. Destruction of lesion - R tricep x 1 Complexity: simple   Destruction method: cryotherapy   Informed consent: discussed and consent obtained   Timeout:  patient name, date of birth, surgical site, and procedure verified Lesion destroyed using liquid nitrogen: Yes   Region frozen until ice ball extended beyond lesion: Yes   Outcome: patient tolerated procedure well with no complications   Post-procedure details: wound care instructions given    Scar L lower leg 2ndary to bypass surgery of leg. Recommend Serica scar gel  Return in about 2 months (around 08/16/2022) for recheck ISk vs wart.  I, Othelia Pulling, RMA, am acting as scribe for Sarina Ser, MD . Documentation: I have reviewed the above documentation for accuracy and completeness, and I agree with the above.  Sarina Ser, MD

## 2022-06-18 DIAGNOSIS — H2511 Age-related nuclear cataract, right eye: Secondary | ICD-10-CM | POA: Diagnosis not present

## 2022-06-18 DIAGNOSIS — B0230 Zoster ocular disease, unspecified: Secondary | ICD-10-CM | POA: Diagnosis not present

## 2022-06-18 DIAGNOSIS — H04123 Dry eye syndrome of bilateral lacrimal glands: Secondary | ICD-10-CM | POA: Diagnosis not present

## 2022-06-23 ENCOUNTER — Other Ambulatory Visit: Payer: Self-pay | Admitting: Dermatology

## 2022-06-23 DIAGNOSIS — L719 Rosacea, unspecified: Secondary | ICD-10-CM

## 2022-06-26 DIAGNOSIS — B0230 Zoster ocular disease, unspecified: Secondary | ICD-10-CM | POA: Diagnosis not present

## 2022-06-29 ENCOUNTER — Other Ambulatory Visit: Payer: Self-pay | Admitting: Family Medicine

## 2022-06-29 DIAGNOSIS — G4700 Insomnia, unspecified: Secondary | ICD-10-CM

## 2022-07-05 NOTE — H&P (View-Only) (Signed)
Patient ID: [name], male   DOB: January 26, 1946, 77 y.o.   MRN: [id number]  No chief complaint on file.   HPI [name] is a 77 y.o. male.    Procedure 05/13/2022: Patient is s/p Left femoral artery to posterior tibial artery bypass with in-situ saphenous vein graft     Today the patient notes his left foot is still numb and does not feel good.  His left lateral ankle wound is not improved.  The incision of the groin still has a raw area but not a major issue.  Duplex ultrasound of the left lower extremity arterial system with the vein graft demonstrates a greater than 70% stenosis in the distal vein graft.  ABIs obtained today Rt=1.33 and Lt=0.67 (previous ABI Rt=1.15 and Lt=0.84)  Past Medical History:  Diagnosis Date   Abnormal prostate specific antigen 01/23/2015   Acute bacterial sinusitis 01/23/2015   Basal cell carcinoma 03/18/2020   right cheek, excised 06/04/20   Cannot sleep 01/23/2015   Disease caused by virus 01/23/2015   Diverticulitis 01/23/2015   Diverticulosis    History of tobacco use 07/26/2009   PNA (pneumonia) 01/23/2015   Prostate cancer ([place])    observing   Severe obstructive sleep apnea 01/23/2015    Past Surgical History:  Procedure Laterality Date   APPENDECTOMY     BACK SURGERY     CATARACT EXTRACTION Left    COLONOSCOPY     FEMORAL-TIBIAL BYPASS GRAFT Left 05/13/2022   Procedure: BYPASS GRAFT FEMORAL-TIBIAL ARTERY (POST TIBIAL);  Surgeon: Katha Cabal, MD;  Location: [facility] ORS;  Service: Vascular;  Laterality: Left;   LEG SURGERY Right    meniscus repair   LOWER EXTREMITY ANGIOGRAPHY Left 03/24/2022   Procedure: Lower Extremity Angiography;  Surgeon: Katha Cabal, MD;  Location: [facility];  Service: Cardiovascular;  Laterality: Left;      Allergies  Allergen Reactions   Penicillins Hives    Current Outpatient Medications  Medication Sig Dispense Refill   albuterol (VENTOLIN HFA) 108 (90 Base)  MCG/ACT inhaler TAKE 2 PUFFS BY MOUTH EVERY 6 HOURS AS NEEDED FOR WHEEZE OR SHORTNESS OF BREATH 8.5 each 0   ALPRAZolam (XANAX) 1 MG tablet TAKE 1/2 TO 1 TABLET BY MOUTH AT BEDTIME 30 tablet 1   ANORO ELLIPTA 62.5-25 MCG/ACT AEPB INHALE 1 PUFF BY MOUTH EVERY DAY 60 each 12   atorvastatin (LIPITOR) 20 MG tablet Take 1 tablet (20 mg total) by mouth every evening. 30 tablet 3   clopidogrel (PLAVIX) 75 MG tablet Take 1 tablet (75 mg total) by mouth daily. 30 tablet 3   doxycycline (VIBRAMYCIN) 50 MG capsule TAKE 1 CAPSULE BY MOUTH EVERY DAY 30 capsule 9   meloxicam (MOBIC) 15 MG tablet Take 15 mg by mouth daily.     metroNIDAZOLE (METROGEL) 1 % gel Apply 1 Application topically as needed.     Naproxen Sodium (ALEVE) 220 MG CAPS Take 440 mg by mouth daily.     No current facility-administered medications for this visit.        Physical Exam There were no vitals taken for this visit. Gen:  WD/WN, NAD Skin: incision C/D/I; pedal pulses left not palpable; palpable pulse in the vein graft at the knee     Assessment/Plan: 1. Atherosclerosis of native arteries of the extremities with ulceration ([place])  Recommend:  The patient has evidence of severe atherosclerotic changes of both lower extremities associated with ulceration and tissue loss of the  left foot.  This represents a limb threatening ischemia and places the patient at the risk for left limb loss.  Noninvasive studies demonstrate a hemodynamically significant stenosis in the distal vein graft below the knee.  This is corroborated by the ABIs which are not improved compared to preop.  This is also supported by the patient's report that his foot remains painful and his ulcer is not improving.  Given this constellation of findings angiography is necessary to prevent complete loss of the graft and recurrence of a nonhealing situation.  Patient should undergo angiography of the left lower extremity with the hope for intervention for limb  salvage.  The risks and benefits as well as the alternative therapies was discussed in detail with the patient.  All questions were answered.  Patient agrees to proceed with left angiography.  The patient will follow up with me in the office after the procedure.       [name] 07/05/2022, 11:24 AM   This note was created with Dragon medical transcription system.  Any errors from dictation are unintentional.

## 2022-07-05 NOTE — Progress Notes (Unsigned)
    Patient ID: [name], male   DOB: 09/19/1945, 77 y.o.   MRN: [id number]  No chief complaint on file.   HPI [name] is a 77 y.o. male.    Procedure 05/13/2022: Patient is s/p Left femoral artery to posterior tibial artery bypass with in-situ saphenous vein graft    Past Medical History:  Diagnosis Date   Abnormal prostate specific antigen 01/23/2015   Acute bacterial sinusitis 01/23/2015   Basal cell carcinoma 03/18/2020   right cheek, excised 06/04/20   Cannot sleep 01/23/2015   Disease caused by virus 01/23/2015   Diverticulitis 01/23/2015   Diverticulosis    History of tobacco use 07/26/2009   PNA (pneumonia) 01/23/2015   Prostate cancer ([place])    observing   Severe obstructive sleep apnea 01/23/2015    Past Surgical History:  Procedure Laterality Date   APPENDECTOMY     BACK SURGERY     CATARACT EXTRACTION Left    COLONOSCOPY     FEMORAL-TIBIAL BYPASS GRAFT Left 05/13/2022   Procedure: BYPASS GRAFT FEMORAL-TIBIAL ARTERY (POST TIBIAL);  Surgeon: Katha Cabal, MD;  Location: [facility] OR[facility];  Service: Vascular;  Laterality: Left;   LEG SURGERY Right    meniscus repair   LOWER EXTREMITY ANGIOGRAPHY Left 03/24/2022   Procedure: Lower Extremity Angiography;  Surgeon: Katha Cabal, MD;  Location: [facility];  Service: Cardiovascular;  Laterality: Left;      Allergies  Allergen Reactions   Penicillins Hives    Current Outpatient Medications  Medication Sig Dispense Refill   albuterol (VENTOLIN HFA) 108 (90 Base) MCG/ACT inhaler TAKE 2 PUFFS BY MOUTH EVERY 6 HOURS AS NEEDED FOR WHEEZE OR SHORTNESS OF BREATH 8.5 each 0   ALPRAZolam (XANAX) 1 MG tablet TAKE 1/2 TO 1 TABLET BY MOUTH AT BEDTIME 30 tablet 1   ANORO ELLIPTA 62.5-25 MCG/ACT AEPB INHALE 1 PUFF BY MOUTH EVERY DAY 60 each 12   atorvastatin (LIPITOR) 20 MG tablet Take 1 tablet (20 mg total) by mouth every evening. 30 tablet 3   clopidogrel (PLAVIX) 75 MG tablet Take 1 tablet (75  mg total) by mouth daily. 30 tablet 3   doxycycline (VIBRAMYCIN) 50 MG capsule TAKE 1 CAPSULE BY MOUTH EVERY DAY 30 capsule 9   meloxicam (MOBIC) 15 MG tablet Take 15 mg by mouth daily.     metroNIDAZOLE (METROGEL) 1 % gel Apply 1 Application topically as needed.     Naproxen Sodium (ALEVE) 220 MG CAPS Take 440 mg by mouth daily.     No current facility-administered medications for this visit.        Physical Exam There were no vitals taken for this visit. Gen:  WD/WN, NAD Skin: incision C/D/I     Assessment/Plan:  No problem-specific Assessment & Plan notes found for this encounter.      [name] 07/05/2022, 11:24 AM   This note was created with Dragon medical transcription system.  Any errors from dictation are unintentional.

## 2022-07-06 ENCOUNTER — Ambulatory Visit (INDEPENDENT_AMBULATORY_CARE_PROVIDER_SITE_OTHER): Payer: Medicare Other | Admitting: Vascular Surgery

## 2022-07-06 ENCOUNTER — Ambulatory Visit (INDEPENDENT_AMBULATORY_CARE_PROVIDER_SITE_OTHER): Payer: Medicare Other

## 2022-07-06 ENCOUNTER — Encounter (INDEPENDENT_AMBULATORY_CARE_PROVIDER_SITE_OTHER): Payer: Self-pay | Admitting: Vascular Surgery

## 2022-07-06 VITALS — BP 137/74 | HR 65 | Resp 18 | Ht 73.0 in | Wt 229.0 lb

## 2022-07-06 DIAGNOSIS — I70299 Other atherosclerosis of native arteries of extremities, unspecified extremity: Secondary | ICD-10-CM

## 2022-07-06 DIAGNOSIS — I7025 Atherosclerosis of native arteries of other extremities with ulceration: Secondary | ICD-10-CM

## 2022-07-06 DIAGNOSIS — L97909 Non-pressure chronic ulcer of unspecified part of unspecified lower leg with unspecified severity: Secondary | ICD-10-CM

## 2022-07-07 ENCOUNTER — Telehealth (INDEPENDENT_AMBULATORY_CARE_PROVIDER_SITE_OTHER): Payer: Self-pay

## 2022-07-07 NOTE — Telephone Encounter (Signed)
Spoke with the patient and he is scheduled with Dr. Delana Meyer for a left leg angio with a 12:00 pm arrival time to the [facility]. Pre-procedure instructions were discussed and will be mailed.

## 2022-07-08 LAB — VAS US ABI WITH/WO TBI
Left ABI: 0.67
Right ABI: 1.33

## 2022-07-09 ENCOUNTER — Telehealth (INDEPENDENT_AMBULATORY_CARE_PROVIDER_SITE_OTHER): Payer: Self-pay

## 2022-07-09 NOTE — Telephone Encounter (Signed)
Patient called about stopping his blood thinners. I reiterated that he does not take anything that we stop so he can take his meds with small sips of water.

## 2022-07-14 ENCOUNTER — Other Ambulatory Visit: Payer: Self-pay

## 2022-07-14 ENCOUNTER — Encounter
Admission: RE | Disposition: A | Discharge status: Home / Self Care | Payer: Self-pay | Source: Ambulatory Visit | Attending: Vascular Surgery

## 2022-07-14 ENCOUNTER — Encounter: Payer: Self-pay | Admitting: Vascular Surgery

## 2022-07-14 ENCOUNTER — Ambulatory Visit
Admission: RE | Admit: 2022-07-14 | Discharge: 2022-07-14 | Disposition: A | Discharge status: Home / Self Care | Payer: Medicare Other | Source: Ambulatory Visit | Attending: Vascular Surgery | Admitting: Vascular Surgery

## 2022-07-14 DIAGNOSIS — I70449 Atherosclerosis of autologous vein bypass graft(s) of the left leg with ulceration of unspecified site: Secondary | ICD-10-CM

## 2022-07-14 DIAGNOSIS — I70243 Atherosclerosis of native arteries of left leg with ulceration of ankle: Secondary | ICD-10-CM | POA: Insufficient documentation

## 2022-07-14 DIAGNOSIS — Y832 Surgical operation with anastomosis, bypass or graft as the cause of abnormal reaction of the patient, or of later complication, without mention of misadventure at the time of the procedure: Secondary | ICD-10-CM | POA: Insufficient documentation

## 2022-07-14 DIAGNOSIS — L97329 Non-pressure chronic ulcer of left ankle with unspecified severity: Secondary | ICD-10-CM | POA: Insufficient documentation

## 2022-07-14 DIAGNOSIS — T85898A Other specified complication of other internal prosthetic devices, implants and grafts, initial encounter: Secondary | ICD-10-CM | POA: Insufficient documentation

## 2022-07-14 DIAGNOSIS — L97909 Non-pressure chronic ulcer of unspecified part of unspecified lower leg with unspecified severity: Secondary | ICD-10-CM

## 2022-07-14 DIAGNOSIS — L97929 Non-pressure chronic ulcer of unspecified part of left lower leg with unspecified severity: Secondary | ICD-10-CM | POA: Diagnosis not present

## 2022-07-14 HISTORY — PX: LOWER EXTREMITY ANGIOGRAPHY: CATH118251

## 2022-07-14 LAB — CREATININE, SERUM
Creatinine, Ser: 0.97 mg/dL (ref 0.61–1.24)
GFR, Estimated: >60 mL/min (ref >60)

## 2022-07-14 LAB — BUN: BUN: 23 mg/dL (ref 8–23)

## 2022-07-14 SURGERY — LOWER EXTREMITY ANGIOGRAPHY
Anesthesia: Moderate Sedation | Site: Leg Lower | Laterality: Left

## 2022-07-14 MED ORDER — MIDAZOLAM HCL 2 MG/ML PO SYRP: 8.0000 mg | ORAL_SOLUTION | Freq: Once | ORAL | Status: DC | PRN

## 2022-07-14 MED ORDER — MORPHINE SULFATE (PF) 4 MG/ML IV SOLN: 2.0000 mg | INTRAVENOUS | Status: DC | PRN

## 2022-07-14 MED ORDER — TRIPLE ANTIBIOTIC 3.5-400-5000 EX OINT
TOPICAL_OINTMENT | CUTANEOUS | Status: AC
  Filled 2022-07-14: qty 1

## 2022-07-14 MED ORDER — VANCOMYCIN HCL IN DEXTROSE 1-5 GM/200ML-% IV SOLN
INTRAVENOUS | Status: AC
  Filled 2022-07-14: qty 200

## 2022-07-14 MED ORDER — ONDANSETRON HCL 4 MG/2ML IJ SOLN: 4.0000 mg | Freq: Four times a day (QID) | INTRAMUSCULAR | Status: DC | PRN

## 2022-07-14 MED ORDER — FAMOTIDINE 20 MG PO TABS: 40.0000 mg | ORAL_TABLET | Freq: Once | ORAL | Status: DC | PRN

## 2022-07-14 MED ORDER — ASPIRIN 81 MG PO TBEC: 81.0000 mg | DELAYED_RELEASE_TABLET | Freq: Every day | ORAL | 1 refills | Status: AC

## 2022-07-14 MED ORDER — ASPIRIN 325 MG PO TABS
325.0000 mg | ORAL_TABLET | ORAL | Status: AC
  Administered 2022-07-14: 325 mg via ORAL
  Filled 2022-07-14 (×2): qty 1

## 2022-07-14 MED ORDER — HEPARIN SODIUM (PORCINE) 1000 UNIT/ML IJ SOLN
INTRAMUSCULAR | Status: AC
  Filled 2022-07-14: qty 10

## 2022-07-14 MED ORDER — MIDAZOLAM HCL 2 MG/2ML IJ SOLN
INTRAMUSCULAR | Status: DC | PRN
  Administered 2022-07-14: 1 mg via INTRAVENOUS
  Administered 2022-07-14: .5 mg via INTRAVENOUS
  Administered 2022-07-14: 2 mg via INTRAVENOUS
  Administered 2022-07-14: 1 mg via INTRAVENOUS

## 2022-07-14 MED ORDER — FENTANYL CITRATE (PF) 100 MCG/2ML IJ SOLN
INTRAMUSCULAR | Status: DC | PRN
  Administered 2022-07-14: 12.5 ug via INTRAVENOUS
  Administered 2022-07-14 (×2): 50 ug via INTRAVENOUS
  Administered 2022-07-14: 25 ug via INTRAVENOUS

## 2022-07-14 MED ORDER — VANCOMYCIN HCL IN DEXTROSE 1-5 GM/200ML-% IV SOLN
1000.0000 mg | INTRAVENOUS | Status: AC
  Administered 2022-07-14: 1000 mg via INTRAVENOUS

## 2022-07-14 MED ORDER — HYDROMORPHONE HCL 1 MG/ML IJ SOLN: 1.0000 mg | Freq: Once | INTRAMUSCULAR | Status: DC | PRN

## 2022-07-14 MED ORDER — SODIUM CHLORIDE 0.9 % IV SOLN: INTRAVENOUS | Status: DC

## 2022-07-14 MED ORDER — IODIXANOL 320 MG/ML IV SOLN
INTRAVENOUS | Status: DC | PRN
  Administered 2022-07-14: 95 mL

## 2022-07-14 MED ORDER — FENTANYL CITRATE (PF) 100 MCG/2ML IJ SOLN
INTRAMUSCULAR | Status: AC
  Filled 2022-07-14: qty 2

## 2022-07-14 MED ORDER — HEPARIN SODIUM (PORCINE) 1000 UNIT/ML IJ SOLN
INTRAMUSCULAR | Status: DC | PRN
  Administered 2022-07-14: 6000 [IU] via INTRAVENOUS

## 2022-07-14 MED ORDER — ASPIRIN 325 MG PO TBEC
DELAYED_RELEASE_TABLET | ORAL | Status: AC
  Filled 2022-07-14: qty 1

## 2022-07-14 MED ORDER — DIPHENHYDRAMINE HCL 50 MG/ML IJ SOLN: 50.0000 mg | Freq: Once | INTRAMUSCULAR | Status: DC | PRN

## 2022-07-14 MED ORDER — FENTANYL CITRATE PF 50 MCG/ML IJ SOSY
PREFILLED_SYRINGE | INTRAMUSCULAR | Status: AC
  Filled 2022-07-14: qty 1

## 2022-07-14 MED ORDER — MIDAZOLAM HCL 5 MG/5ML IJ SOLN
INTRAMUSCULAR | Status: AC
  Filled 2022-07-14: qty 5

## 2022-07-14 MED ORDER — METHYLPREDNISOLONE SODIUM SUCC 125 MG IJ SOLR: 125.0000 mg | Freq: Once | INTRAMUSCULAR | Status: DC | PRN

## 2022-07-14 SURGICAL SUPPLY — 32 items
BALLN LUTONIX 018 4X220X130 (BALLOONS) ×1
BALLN LUTONIX 018 4X40X130 (BALLOONS) ×1
BALLN LUTONIX 4X120X130 (BALLOONS) ×1
BALLN ULTRASCORE 014 3X150X150 (BALLOONS) ×1
BALLN ULTRSCOR 014 2.5X150X150 (BALLOONS) ×1
BALLOON LUTONIX 018 4X220X130 (BALLOONS) IMPLANT
BALLOON LUTONIX 018 4X40X130 (BALLOONS) IMPLANT
BALLOON LUTONIX 4X120X130 (BALLOONS) IMPLANT
BALLOON ULTRSC 014 2.5X150X150 (BALLOONS) IMPLANT
BALLOON ULTRSCRE 014 3X150X150 (BALLOONS) IMPLANT
CATH ANGIO 5F PIGTAIL 65CM (CATHETERS) IMPLANT
CATH BEACON 5 .035 65 KMP TIP (CATHETERS) IMPLANT
CATH VERT 5FR 125CM (CATHETERS) IMPLANT
DEVICE STARCLOSE SE CLOSURE (Vascular Products) IMPLANT
GLIDEWIRE ADV .035X260CM (WIRE) IMPLANT
GOWN STRL REUS W/ TWL LRG LVL3 (GOWN DISPOSABLE) ×1 IMPLANT
GOWN STRL REUS W/TWL LRG LVL3 (GOWN DISPOSABLE) ×1
KIT ENCORE 26 ADVANTAGE (KITS) IMPLANT
NDL ENTRY 21GA 7CM ECHOTIP (NEEDLE) IMPLANT
NEEDLE ENTRY 21GA 7CM ECHOTIP (NEEDLE) ×1 IMPLANT
PACK ANGIOGRAPHY (CUSTOM PROCEDURE TRAY) ×1 IMPLANT
SET INTRO CAPELLA COAXIAL (SET/KITS/TRAYS/PACK) IMPLANT
SHEATH ANL2 6FRX45 HC (SHEATH) IMPLANT
SHEATH BRITE TIP 5FRX11 (SHEATH) IMPLANT
STENT VIABAHN 5X25X120 (Permanent Stent) IMPLANT
STENT VIABAHN 6X100X120 (Permanent Stent) IMPLANT
STENT VIABAHN 6X150X120 (Permanent Stent) IMPLANT
SYR MEDRAD MARK 7 150ML (SYRINGE) IMPLANT
TUBING CONTRAST HIGH PRESS 72 (TUBING) IMPLANT
WIRE GUIDERIGHT .035X150 (WIRE) IMPLANT
WIRE HI TORQ COMMND ES.014X300 (WIRE) IMPLANT
WIRE SPARTACORE .014X300CM (WIRE) IMPLANT

## 2022-07-14 NOTE — Op Note (Signed)
[facility]  Percutaneous Study/Intervention Procedural Note   Date of Surgery: 07/14/2022,3:12 PM  Surgeon:Jeramey Lanuza, Dolores Lory   Pre-operative Diagnosis: Atherosclerotic occlusive disease of the left lower extremity with ulceration; complication vein bypass graft  Post-operative diagnosis:  Same  Procedure(s) Performed:  1.  Left lower extremity angiography  2.  Percutaneous transluminal angioplasty and stent placement left saphenous vein bypass  3.  Star[employer] right common femoral   Anesthesia: Conscious sedation was administered by the interventional radiology RN under my direct supervision. IV Versed plus fentanyl were utilized. Continuous ECG, pulse oximetry and blood pressure was monitored throughout the entire procedure. Conscious sedation was administered for a total of 1 hour 40 minutes 17 seconds.  Sheath: 6 [place] Ansell right common femoral retrograde  Contrast: 95 cc   Fluoroscopy Time: 11.6 minutes  Indications: Patient presented for his routine follow-up noninvasive studies as well as physical examination suggested deterioration of his arterial status compared with his discharge from the hospital.  Angiography is recommended for salvage of his graft.  Risks and benefits of been reviewed all questions answered patient agrees to proceed.  Procedure:  Christopher Panciera Westcottis a 77 y.o. male who was identified and appropriate procedural time out was performed.  The patient was then placed supine on the table and prepped and draped in the usual sterile fashion.  Ultrasound was used to evaluate the right common femoral artery.  It was echolucent and pulsatile indicating it is patent .  An ultrasound image was acquired for the permanent record.  A micropuncture needle was used to access the right common femoral artery under direct ultrasound guidance.  The microwire was then advanced under fluoroscopic guidance without difficulty followed by the micro-sheath.  A  0.035 J wire was advanced without resistance and a 5Fr sheath was placed.    Pigtail catheter was advanced into the distal aorta and an RAO projection of the pelvis was obtained.  Pigtail catheter and an advantage wire were then used to cross the bifurcation and LAO projection of the right femoral bifurcation was obtained.  The vein bypass graft was then selected using a Kumpe catheter and the advantage wire.  Using a 125 vertebral catheter and the advantage wire I was able to advance the wire and catheter into the posterior tibial artery through the bypass.  Bypass appeared to be occluded distally but there was clearly a retained valve just above the level of the knee in association with a very large branch that was siphoning blood away.  Having confirmed the distal anastomosis was widely patent and the posterior tibial was patent.  I exchanged for a 0.014 wire beginning with a 2.5 mm x 150 mm ultra score balloon I balloon from the anastomosis proximally.  I then repeated this with a 3.5 mm x 150 mm ultra score.  At this point I was able to ascertain after taking multiple views that the large branch was only 15 to 20 mm from the sclerotic valve and therefore I deployed a 6 mm x 100 mm Viabahn stent and postdilated this using a 4 mm balloon.  I then treated the distal vein with a 4 mm balloon.  Follow-up imaging still demonstrated significant sclerotic segments and I then deployed a 6 mm x 150 mm stent and posted this with a 4 mm balloon.  Small amount of extravasation and persistent narrowing was noted distal to the Viabahn and therefore a 5 mm x 2.5 mm Viabahn stent was deployed down to the  level of the anastomosis and postdilated with a 4 mm Lutonix extending into the anastomosis.  Follow-up imaging now demonstrated patency of the bypass filling of the anastomosis which was patent in both AP and lateral views and then filling both retrograde into the tibioperoneal trunk and anterior tibial as well as  antegrade down to the ankle.   Findings:   Left Lower Extremity: The distal aorta and iliac arteries on the left are widely patent.  Common femoral profunda femoris on the proximal half of the SFA remains patent.  The vein bypass is easily visualized and in the groin extending down to the level of the knee is widely patent and measures 6 mm in diameter.  At the knee there is a large branch which fills numerous deeper branches.  This obscures the actual bypass to a significant degree.  There also is a sclerotic valve as described above.  Once the branch had been covered with the Viabahn and this also simultaneously treated the sclerotic valve addressing the more distal bypass with serial dilatation was performed this did not achieve a uniform outflow and therefore 2 more Viabahn stents were deployed successfully reconstructing the vein bypass in its entirety.    Disposition: Patient was taken to the recovery room in stable condition having tolerated the procedure well.  [name] 07/14/2022,3:12 PM

## 2022-07-14 NOTE — Progress Notes (Signed)
PT noted to have bleeding from suture removal sites on L leg. Dr. Delana Meyer made aware. Verbal orders to hold pressure and put new bandaid. Pressure held and sites redressed. Areas clean dry intact upon discharge.

## 2022-07-14 NOTE — Interval H&P Note (Signed)
History and Physical Interval Note:  07/14/2022 3:04 PM  [name]  has presented today for surgery, with the diagnosis of LLE Angio   BARD   ASO w ulceration.  The various methods of treatment have been discussed with the patient and family. After consideration of risks, benefits and other options for treatment, the patient has consented to  Procedure(s): Lower Extremity Angiography (Left) as a surgical intervention.  The patient's history has been reviewed, patient examined, no change in status, stable for surgery.  I have reviewed the patient's chart and labs.  Questions were answered to the patient's satisfaction.     [name]

## 2022-07-14 NOTE — Interval H&P Note (Signed)
History and Physical Interval Note:  07/14/2022 12:43 PM  [name]  has presented today for surgery, with the diagnosis of LLE Angio   BARD   ASO w ulceration.  The various methods of treatment have been discussed with the patient and family. After consideration of risks, benefits and other options for treatment, the patient has consented to  Procedure(s): Lower Extremity Angiography (Left) as a surgical intervention.  The patient's history has been reviewed, patient examined, no change in status, stable for surgery.  I have reviewed the patient's chart and labs.  Questions were answered to the patient's satisfaction.     [name]

## 2022-07-15 ENCOUNTER — Encounter: Payer: Self-pay | Admitting: Vascular Surgery

## 2022-07-17 ENCOUNTER — Telehealth (INDEPENDENT_AMBULATORY_CARE_PROVIDER_SITE_OTHER): Payer: Self-pay

## 2022-07-17 NOTE — Telephone Encounter (Signed)
Patient had LLE Angio w ulceration on 07/14/22 but now has a pretty decent  pain below his knee. He states the pain is better than yesterday, but its getting harder for him to walk. He would like to know is that normal.  Please advise

## 2022-07-17 NOTE — Telephone Encounter (Signed)
Yes this is not necessarily uncommon post angiogram to have pain post intervention. He can try tylenol and ibuprofen but it can take a few days to subside.  If the pain becomes exponentially worse over the weekend or he becomes unable to walk, he should be seen in the ED

## 2022-07-17 NOTE — Telephone Encounter (Signed)
Patient informed with all the info.

## 2022-07-20 ENCOUNTER — Encounter: Payer: Self-pay | Admitting: Vascular Surgery

## 2022-07-22 DIAGNOSIS — M1711 Unilateral primary osteoarthritis, right knee: Secondary | ICD-10-CM | POA: Diagnosis not present

## 2022-07-28 DIAGNOSIS — M1711 Unilateral primary osteoarthritis, right knee: Secondary | ICD-10-CM | POA: Diagnosis not present

## 2022-07-30 ENCOUNTER — Other Ambulatory Visit (INDEPENDENT_AMBULATORY_CARE_PROVIDER_SITE_OTHER): Payer: Self-pay | Admitting: Vascular Surgery

## 2022-07-30 DIAGNOSIS — I70349 Atherosclerosis of unspecified type of bypass graft(s) of the left leg with ulceration of unspecified site: Secondary | ICD-10-CM

## 2022-07-30 DIAGNOSIS — Z95820 Peripheral vascular angioplasty status with implants and grafts: Secondary | ICD-10-CM

## 2022-08-05 ENCOUNTER — Ambulatory Visit (INDEPENDENT_AMBULATORY_CARE_PROVIDER_SITE_OTHER): Payer: Medicare Other | Admitting: Nurse Practitioner

## 2022-08-05 ENCOUNTER — Encounter (INDEPENDENT_AMBULATORY_CARE_PROVIDER_SITE_OTHER): Payer: Self-pay | Admitting: Nurse Practitioner

## 2022-08-05 ENCOUNTER — Ambulatory Visit (INDEPENDENT_AMBULATORY_CARE_PROVIDER_SITE_OTHER): Payer: Medicare Other

## 2022-08-05 VITALS — BP 146/80 | HR 55 | Resp 18 | Ht 73.0 in | Wt 226.0 lb

## 2022-08-05 DIAGNOSIS — Z95820 Peripheral vascular angioplasty status with implants and grafts: Secondary | ICD-10-CM

## 2022-08-05 DIAGNOSIS — I70349 Atherosclerosis of unspecified type of bypass graft(s) of the left leg with ulceration of unspecified site: Secondary | ICD-10-CM | POA: Diagnosis not present

## 2022-08-05 DIAGNOSIS — M722 Plantar fascial fibromatosis: Secondary | ICD-10-CM

## 2022-08-05 DIAGNOSIS — I7025 Atherosclerosis of native arteries of other extremities with ulceration: Secondary | ICD-10-CM

## 2022-08-05 DIAGNOSIS — S91002A Unspecified open wound, left ankle, initial encounter: Secondary | ICD-10-CM

## 2022-08-05 MED ORDER — CLOPIDOGREL BISULFATE 75 MG PO TABS: 75.0000 mg | ORAL_TABLET | Freq: Every day | ORAL | 6 refills | Status: DC

## 2022-08-06 ENCOUNTER — Other Ambulatory Visit (INDEPENDENT_AMBULATORY_CARE_PROVIDER_SITE_OTHER): Payer: Self-pay | Admitting: Specialist

## 2022-08-06 LAB — VAS US ABI WITH/WO TBI
Left ABI: 1.01
Right ABI: 1.09

## 2022-08-10 ENCOUNTER — Telehealth (INDEPENDENT_AMBULATORY_CARE_PROVIDER_SITE_OTHER): Payer: Self-pay | Admitting: Nurse Practitioner

## 2022-08-10 NOTE — Telephone Encounter (Signed)
Pt called stating that he is waiting on a referral to [facility]. I advised pt of PT # and that I would get a note in for the [place] referral. He states that he really needs this to happen ASAP. Please advise.

## 2022-08-11 ENCOUNTER — Encounter (INDEPENDENT_AMBULATORY_CARE_PROVIDER_SITE_OTHER): Payer: Self-pay | Admitting: Nurse Practitioner

## 2022-08-11 NOTE — Progress Notes (Signed)
Subjective:    Patient ID: ABBIE Pratt, male    DOB: 10-02-1945, 77 y.o.   MRN: [id number] Chief Complaint  Patient presents with   Follow-up    fu in 3 weeks with abi    The patient returns to the office for followup and review status post angiogram with intervention on 07/14/2022.   Procedure: Procedure(s) Performed:             1.  Left lower extremity angiography             2.  Percutaneous transluminal angioplasty and stent placement left saphenous vein bypass             3.  [employer] right common femoral   The patient notes improvement in the lower extremity symptoms. No interval shortening of the patient's claudication distance or rest pain symptoms. No new ulcers or wounds have occurred since the last visit.  Still continues to have a wound on his left ankle area which has been slow healing for about a year.  He notes some weakness following the periods of ischemia.  There have been no significant changes to the patient's overall health care.  No documented history of amaurosis fugax or recent TIA symptoms. There are no recent neurological changes noted. No documented history of DVT, PE or superficial thrombophlebitis. The patient denies recent episodes of angina or shortness of breath.   ABI's Rt=1.09 and Lt=1.01  (previous ABI's Rt=1.33 and Lt=0.57) Duplex US of the has triphasic tibial artery waveforms bilaterally.    Review of Systems  Skin:  Positive for wound.  All other systems reviewed and are negative.      Objective:   Physical Exam Vitals reviewed.  HENT:     Head: Normocephalic.  Cardiovascular:     Rate and Rhythm: Normal rate.     Pulses:          Dorsalis pedis pulses are detected w/ Doppler on the right side and detected w/ Doppler on the left side.       Posterior tibial pulses are detected w/ Doppler on the right side and detected w/ Doppler on the left side.  Pulmonary:     Effort: Pulmonary effort is normal.  Skin:    General:  Skin is warm and dry.  Neurological:     Mental Status: He is alert and oriented to person, place, and time.  Psychiatric:        Mood and Affect: Mood normal.        Behavior: Behavior normal.        Thought Content: Thought content normal.        Judgment: Judgment normal.     BP (!) 146/80 (BP Location: Right Arm)   Pulse (!) 55   Resp 18   Ht 6\' 1"  (1.854 m)   Wt 226 lb (102.5 kg)   BMI 29.82 kg/m   Past Medical History:  Diagnosis Date   Abnormal prostate specific antigen 01/23/2015   Acute bacterial sinusitis 01/23/2015   Basal cell carcinoma 03/18/2020   right cheek, excised 06/04/20   Cannot sleep 01/23/2015   Disease caused by virus 01/23/2015   Diverticulitis 01/23/2015   Diverticulosis    History of tobacco use 07/26/2009   PNA (pneumonia) 01/23/2015   Prostate cancer    observing   Severe obstructive sleep apnea 01/23/2015    Social History   Socioeconomic History   Marital status: Married    Spouse name: Not on file  Number of children: Not on file   Years of education: Not on file   Highest education level: Not on file  Occupational History   Not on file  Tobacco Use   Smoking status: Former    Packs/day: 0.75    Years: 56.00    Additional pack years: 0.00    Total pack years: 42.00    Types: Cigarettes   Smokeless tobacco: Never   Tobacco comments:    Started smoking age 77, usually about 3/4 ppd Quit in 2022  Vaping Use   Vaping Use: Never used  Substance and Sexual Activity   Alcohol use: Yes    Alcohol/week: 14.0 standard drinks of alcohol    Types: 14 Glasses of wine per week   Drug use: No   Sexual activity: Not on file  Other Topics Concern   Not on file  Social History Narrative   Not on file   Social Determinants of Health   Financial Resource Strain: Patient Declined (12/15/2021)   Overall Financial Resource Strain (CARDIA)    Difficulty of Paying Living Expenses: Patient declined  Food Insecurity: No Food Insecurity  (12/15/2021)   Hunger Vital Sign    Worried About Running Out of Food in the Last Year: Never true    Ran Out of Food in the Last Year: Never true  Transportation Needs: No Transportation Needs (12/15/2021)   PRAPARE - Administrator, Civil ServiceTransportation    Lack of Transportation (Medical): No    Lack of Transportation (Non-Medical): No  Physical Activity: Insufficiently Active (12/15/2021)   Exercise Vital Sign    Days of Exercise per Week: 2 days    Minutes of Exercise per Session: 20 min  Stress: No Stress Concern Present (12/15/2021)   [employer] - Occupational Stress Questionnaire    Feeling of Stress : Not at all  Social Connections: Socially Integrated (12/15/2021)   Social Connection and Isolation Panel [NHANES]    Frequency of Communication with Friends and Family: More than three times a week    Frequency of Social Gatherings with Friends and Family: Twice a week    Attends Religious Services: More than 4 times per year    Active Member of [employer] Financial[employer]ubs or Organizations: Yes    Attends Engineer, structuralClub or Organization Meetings: More than 4 times per year    Marital Status: Married  Catering managerntimate Partner Violence: Not At Risk (12/15/2021)   Humiliation, Afraid, Rape, and Kick questionnaire    Fear of Current or Ex-Partner: No    Emotionally Abused: No    Physically Abused: No    Sexually Abused: No    Past Surgical History:  Procedure Laterality Date   APPENDECTOMY     BACK SURGERY     CATARACT EXTRACTION Left    COLONOSCOPY     FEMORAL-TIBIAL BYPASS GRAFT Left 05/13/2022   Procedure: BYPASS GRAFT FEMORAL-TIBIAL ARTERY (POST TIBIAL);  Surgeon: Renford DillsSchnier, Gregory G, MD;  Location: [facility] ORS;  Service: Vascular;  Laterality: Left;   LEG SURGERY Right    meniscus repair   LOWER EXTREMITY ANGIOGRAPHY Left 03/24/2022   Procedure: Lower Extremity Angiography;  Surgeon: Renford DillsSchnier, Gregory G, MD;  Location: [facility];  Service: Cardiovascular;  Laterality: Left;   LOWER EXTREMITY ANGIOGRAPHY Left  07/14/2022   Procedure: Lower Extremity Angiography;  Surgeon: Renford DillsSchnier, Gregory G, MD;  Location: [facility];  Service: Cardiovascular;  Laterality: Left;    Family History  Problem Relation Age of Onset   Prostate cancer Father  Heart disease Father        MI in his 60s, but died at age 98    Allergies  Allergen Reactions   Penicillins Hives       Latest Ref Rng & Units 05/14/2022   12:05 AM 05/05/2022   10:09 AM 08/13/2021   11:27 AM  CBC  WBC 4.0 - 10.5 K/uL 13.9  8.6  6.9   Hemoglobin 13.0 - 17.0 g/dL 21.3  08.6  57.8   Hematocrit 39.0 - 52.0 % 39.7  46.0  46.7   Platelets 150 - 400 K/uL 177  154  175       CMP     Component Value Date/Time   NA 135 05/14/2022 0005   K 4.3 05/14/2022 0005   CL 105 05/14/2022 0005   CO2 22 05/14/2022 0005   GLUCOSE 142 (H) 05/14/2022 0005   BUN 23 07/14/2022 1234   CREATININE 0.97 07/14/2022 1234   CALCIUM 8.3 (L) 05/14/2022 0005   GFRNONAA >60 07/14/2022 1234   GFRAA >60 10/09/2017 1931     VAS [place] ABI WITH/WO TBI  Result Date: 08/06/2022  LOWER EXTREMITY DOPPLER STUDY Patient Name:  MENELIK MCFARREN  Date of Exam:   08/05/2022 Medical Rec #: [id number]         Accession #:    [id number] Date of Birth: October 18, 1945        Patient Gender: M Patient Age:   19 years Exam Location:  [facility] Procedure:      VAS [place] ABI WITH/WO TBI Referring Phys: Levora Dredge --------------------------------------------------------------------------------  Indications: Ulceration, and peripheral artery disease.  Vascular Interventions: Left fem- posterior tibial artery bypass graft                         05/13/2022                          07/14/2022: Left Lower Extremity Angiography. PTA and                         Stent placement of the Left Saphenous Vein Bypass. Comparison Study: 07/06/2022 Performing Technologist: Debbe Bales RVS  Examination Guidelines: A complete evaluation includes at minimum, Doppler waveform signals  and systolic blood pressure reading at the level of bilateral brachial, anterior tibial, and posterior tibial arteries, when vessel segments are accessible. Bilateral testing is considered an integral part of a complete examination. Photoelectric Plethysmograph (PPG) waveforms and toe systolic pressure readings are included as required and additional duplex testing as needed. Limited examinations for reoccurring indications may be performed as noted.  ABI Findings: +---------+------------------+-----+---------+--------+ Right    Rt Pressure (mmHg)IndexWaveform Comment  +---------+------------------+-----+---------+--------+ Brachial 149                                      +---------+------------------+-----+---------+--------+ ATA      167               1.09 triphasic         +---------+------------------+-----+---------+--------+ PTA      160               1.05 triphasic         +---------+------------------+-----+---------+--------+ Great Toe146               0.95 Normal            +---------+------------------+-----+---------+--------+ +---------+------------------+-----+---------+-------+  Left     Lt Pressure (mmHg)IndexWaveform Comment +---------+------------------+-----+---------+-------+ Brachial 153                                     +---------+------------------+-----+---------+-------+ ATA      149               0.97 triphasic        +---------+------------------+-----+---------+-------+ PTA      155               1.01 triphasic        +---------+------------------+-----+---------+-------+ Great Toe131               0.86 Normal           +---------+------------------+-----+---------+-------+ +-------+-----------+-----------+------------+------------+ ABI/TBIToday's ABIToday's TBIPrevious ABIPrevious TBI +-------+-----------+-----------+------------+------------+ Right  1.09       .95        1.33        .77           +-------+-----------+-----------+------------+------------+ Left   1.01       .86        .67         0            +-------+-----------+-----------+------------+------------+ Right ABIs appear essentially unchanged compared to prior study on 07/06/2022. Bilateral TBIs appear increased compared to prior study on 07/06/2022. Left ABIs appear to be increased compared to prior study on 07/06/2022.  Summary: Right: Resting right ankle-brachial index is within normal range. The right toe-brachial index is normal. Left: Resting left ankle-brachial index is within normal range. The left toe-brachial index is normal. *See table(s) above for measurements and observations.   Electronically signed by Levora Dredge MD on 08/06/2022 at 4:54:03 PM.    Final    VAS [place] ABI WITH/WO TBI  Result Date: 07/08/2022  LOWER EXTREMITY DOPPLER STUDY Patient Name:  LAVAN IMES  Date of Exam:   07/06/2022 Medical Rec #: [id number]         Accession #:    [id number] Date of Birth: February 05, 1946        Patient Gender: M Patient Age:   2 years Exam Location:  [facility] Procedure:      VAS [place] ABI WITH/WO TBI Referring Phys: [facility] --------------------------------------------------------------------------------  Indications: Ulceration.  Vascular               Left fem- posterior tibial artery bypass graft Interventions:         05/13/2022. Performing Technologist: Hardie Lora RVT  Examination Guidelines: A complete evaluation includes at minimum, Doppler waveform signals and systolic blood pressure reading at the level of bilateral brachial, anterior tibial, and posterior tibial arteries, when vessel segments are accessible. Bilateral testing is considered an integral part of a complete examination. Photoelectric Plethysmograph (PPG) waveforms and toe systolic pressure readings are included as required and additional duplex testing as needed. Limited examinations for reoccurring indications may be performed as  noted.  ABI Findings: +---------+------------------+-----+---------+--------+ Right    Rt Pressure (mmHg)IndexWaveform Comment  +---------+------------------+-----+---------+--------+ Brachial 128                                      +---------+------------------+-----+---------+--------+ PTA      154               1.18 triphasic         +---------+------------------+-----+---------+--------+  DP       173               1.33 triphasic         +---------+------------------+-----+---------+--------+ Great Toe100               0.77                   +---------+------------------+-----+---------+--------+ +---------+------------------+-----+----------+-------+ Left     Lt Pressure (mmHg)IndexWaveform  Comment +---------+------------------+-----+----------+-------+ Brachial 130                                      +---------+------------------+-----+----------+-------+ PTA      84                0.65 monophasic        +---------+------------------+-----+----------+-------+ DP       87                0.67 monophasic        +---------+------------------+-----+----------+-------+ Great Toe0                 0.00                   +---------+------------------+-----+----------+-------+ +-------+-----------+-----------+------------+------------+ ABI/TBIToday's ABIToday's TBIPrevious ABIPrevious TBI +-------+-----------+-----------+------------+------------+ Right  1.33       0.77       1.15        0.98         +-------+-----------+-----------+------------+------------+ Left   0.67       0.00       0.84        0.59         +-------+-----------+-----------+------------+------------+ Arterial wall calcification precludes accurate ankle pressures and ABIs. Right ABIs appear essentially unchanged compared to prior study on 02/26/2022. Left ABIs appear decreased compared to prior study on 02/26/2022.  Summary: Right: Resting right ankle-brachial index  indicates noncompressible right lower extremity arteries. The right toe-brachial index is normal. Left: Resting left ankle-brachial index indicates moderate left lower extremity arterial disease. The left toe-brachial index is abnormal. *See table(s) above for measurements and observations.  Electronically signed by Levora Dredge MD on 07/08/2022 at 2:11:36 PM.    Final        Assessment & Plan:   1. Atherosclerosis of native arteries of the extremities with ulceration (HCC) Recommend:  The patient is status post successful angiogram with intervention.  The patient reports that the claudication symptoms and leg pain has improved.   The patient denies lifestyle limiting changes at this point in time.  No further invasive studies, angiography or surgery at this time The patient should continue walking and begin a more formal exercise program.  The patient should continue antiplatelet therapy and aggressive treatment of the lipid abnormalities  Continued surveillance is indicated as atherosclerosis is likely to progress with time.    Patient should undergo noninvasive studies as ordered. The patient will follow up with me to review the studies.   2. Plantar fasciitis of left foot The patient has some previous plantar fasciitis and some weakness given his lower extremity ischemia.  Will refer to physical therapy to help with some strengthening and exercise to help with his plantar fasciitis.  3. Ankle wound, left, initial encounter The patient has had a slow healing wound.  Currently the patient has excellent perfusion.  Will refer to the wound center for further treatment to help with finalizing  wound healing.   Current Outpatient Medications on File Prior to Visit  Medication Sig Dispense Refill   albuterol (VENTOLIN HFA) 108 (90 Base) MCG/ACT inhaler TAKE 2 PUFFS BY MOUTH EVERY 6 HOURS AS NEEDED FOR WHEEZE OR SHORTNESS OF BREATH 8.5 each 0   ALPRAZolam (XANAX) 1 MG tablet TAKE 1/2 TO 1  TABLET BY MOUTH AT BEDTIME 30 tablet 1   ANORO ELLIPTA 62.5-25 MCG/ACT AEPB INHALE 1 PUFF BY MOUTH EVERY DAY 60 each 12   aspirin EC 81 MG tablet Take 1 tablet (81 mg total) by mouth daily. Swallow whole. 150 tablet 1   atorvastatin (LIPITOR) 20 MG tablet Take 1 tablet (20 mg total) by mouth every evening. 30 tablet 3   doxycycline (VIBRAMYCIN) 50 MG capsule TAKE 1 CAPSULE BY MOUTH EVERY DAY 30 capsule 9   meloxicam (MOBIC) 15 MG tablet Take 15 mg by mouth daily. (Patient not taking: Reported on 07/14/2022)     metroNIDAZOLE (METROGEL) 1 % gel Apply 1 Application topically as needed. (Patient not taking: Reported on 07/14/2022)     Naproxen Sodium (ALEVE) 220 MG CAPS Take 440 mg by mouth daily.     No current facility-administered medications on file prior to visit.    There are no Patient Instructions on file for this visit. No follow-ups on file.   [name], NP

## 2022-08-13 ENCOUNTER — Encounter (INDEPENDENT_AMBULATORY_CARE_PROVIDER_SITE_OTHER): Payer: Self-pay | Admitting: Vascular Surgery

## 2022-08-17 ENCOUNTER — Other Ambulatory Visit: Payer: Self-pay | Admitting: Family Medicine

## 2022-08-17 DIAGNOSIS — J40 Bronchitis, not specified as acute or chronic: Secondary | ICD-10-CM

## 2022-08-18 MED ORDER — ALBUTEROL SULFATE HFA 108 (90 BASE) MCG/ACT IN AERS: INHALATION_SPRAY | RESPIRATORY_TRACT | 0 refills | Status: DC

## 2022-08-19 MED ORDER — ALBUTEROL SULFATE HFA 108 (90 BASE) MCG/ACT IN AERS: INHALATION_SPRAY | RESPIRATORY_TRACT | 0 refills | Status: DC

## 2022-08-19 NOTE — Addendum Note (Signed)
Addended by: Mila Merry E on: 08/19/2022 12:16 PM   Modules accepted: Orders

## 2022-08-20 ENCOUNTER — Ambulatory Visit (INDEPENDENT_AMBULATORY_CARE_PROVIDER_SITE_OTHER): Payer: Medicare Other | Admitting: Dermatology

## 2022-08-20 ENCOUNTER — Encounter: Payer: Self-pay | Admitting: Dermatology

## 2022-08-20 VITALS — BP 133/64

## 2022-08-20 DIAGNOSIS — B078 Other viral warts: Secondary | ICD-10-CM

## 2022-08-20 DIAGNOSIS — L578 Other skin changes due to chronic exposure to nonionizing radiation: Secondary | ICD-10-CM | POA: Diagnosis not present

## 2022-08-20 DIAGNOSIS — B0229 Other postherpetic nervous system involvement: Secondary | ICD-10-CM | POA: Diagnosis not present

## 2022-08-20 DIAGNOSIS — D492 Neoplasm of unspecified behavior of bone, soft tissue, and skin: Secondary | ICD-10-CM

## 2022-08-20 DIAGNOSIS — Z79899 Other long term (current) drug therapy: Secondary | ICD-10-CM

## 2022-08-20 DIAGNOSIS — L720 Epidermal cyst: Secondary | ICD-10-CM

## 2022-08-20 DIAGNOSIS — Z7189 Other specified counseling: Secondary | ICD-10-CM

## 2022-08-20 DIAGNOSIS — L729 Follicular cyst of the skin and subcutaneous tissue, unspecified: Secondary | ICD-10-CM

## 2022-08-20 NOTE — Progress Notes (Signed)
Follow-Up Visit   Subjective  [name] is a 77 y.o. male who presents for the following: recheck ISK vs Wart R tricep not resolved with LN2, Hx of Shingles scalp still itchy, txted with Valtrex 06/17/22, check spot post neck The patient has spots, moles and lesions to be evaluated, some may be new or changing and the patient has concerns that these could be cancer.  The following portions of the chart were reviewed this encounter and updated as appropriate: medications, allergies, medical history  Review of Systems:  No other skin or systemic complaints except as noted in HPI or Assessment and Plan.  Objective  Well appearing patient in no apparent distress; mood and affect are within normal limits.  A focused examination was performed of the following areas: Scalp, neck, right tricep  Relevant exam findings are noted in the Assessment and Plan.  R tricep Hyperkeratotic pap 1.1cm     R sup forehead Flesh colored pap 1.2cm        Assessment & Plan   EPIDERMAL INCLUSION CYST Exam: Subcutaneous nodule at 0.8cm R post neck  Benign-appearing. Exam most consistent with an epidermal inclusion cyst. Discussed that a cyst is a benign growth that can grow over time and sometimes get irritated or inflamed. Recommend observation if it is not bothersome. Discussed option of surgical excision to remove it if it is growing, symptomatic, or other changes noted. Please call for new or changing lesions so they can be evaluated.  POST HERPETIC NEURALGIA scalp Exam: scalp clear Treatment Plan: Discussed can have itching, tingling in area of previous Shingles  May use Voltaren cream.  Other options include oral gabapentin.  Patient declines treatment at this time.  Neoplasm of skin (2) R tricep  Epidermal / dermal shaving  Lesion diameter (cm):  1.1 Informed consent: discussed and consent obtained   Timeout: patient name, date of birth, surgical site, and procedure  verified   Procedure prep:  Patient was prepped and draped in usual sterile fashion Prep type:  Isopropyl alcohol Anesthesia: the lesion was anesthetized in a standard fashion   Anesthetic:  1% lidocaine w/ epinephrine 1-100,000 buffered w/ 8.4% NaHCO3 Instrument used: flexible razor blade   Hemostasis achieved with: pressure, aluminum chloride and electrodesiccation   Outcome: patient tolerated procedure well   Post-procedure details: sterile dressing applied and wound care instructions given   Dressing type: bandage and bacitracin    Destruction of lesion Complexity: extensive   Destruction method: electrodesiccation and curettage   Informed consent: discussed and consent obtained   Timeout:  patient name, date of birth, surgical site, and procedure verified Procedure prep:  Patient was prepped and draped in usual sterile fashion Prep type:  Isopropyl alcohol Anesthesia: the lesion was anesthetized in a standard fashion   Anesthetic:  1% lidocaine w/ epinephrine 1-100,000 buffered w/ 8.4% NaHCO3 Curettage performed in three different directions: Yes   Electrodesiccation performed over the curetted area: Yes   Lesion length (cm):  1.1 Lesion width (cm):  1.1 Margin per side (cm):  0.2 Final wound size (cm):  1.5 Hemostasis achieved with:  pressure, aluminum chloride and electrodesiccation Outcome: patient tolerated procedure well with no complications   Post-procedure details: sterile dressing applied and wound care instructions given   Dressing type: bandage and bacitracin    Specimen 1 - Surgical pathology Differential Diagnosis: D48.5 R/O SCC vs ISK vs Wart  Check Margins: yes Hyperkeratotic pap 1.1cm EDC  R sup forehead  Skin / nail biopsy  Type of biopsy: tangential   Informed consent: discussed and consent obtained   Timeout: patient name, date of birth, surgical site, and procedure verified   Procedure prep:  Patient was prepped and draped in usual sterile  fashion Prep type:  Isopropyl alcohol Anesthesia: the lesion was anesthetized in a standard fashion   Anesthetic:  1% lidocaine w/ epinephrine 1-100,000 buffered w/ 8.4% NaHCO3 Instrument used: flexible razor blade   Outcome: patient tolerated procedure well   Post-procedure details: sterile dressing applied and wound care instructions given   Dressing type: bandage and bacitracin    Specimen 2 - Surgical pathology Differential Diagnosis: R/O BCC  Check Margins: No Flesh colored pap 1.2cm  Actinic skin damage  Cyst of skin  Postherpetic neuralgia  Medication management   ACTINIC DAMAGE - chronic, secondary to cumulative UV radiation exposure/sun exposure over time - diffuse scaly erythematous macules with underlying dyspigmentation - Recommend daily broad spectrum sunscreen SPF 30+ to sun-exposed areas, reapply every 2 hours as needed.  - Recommend staying in the shade or wearing long sleeves, sun glasses (UVA+UVB protection) and wide brim hats (4-inch brim around the entire circumference of the hat). - Call for new or changing lesions.  Return in about 6 months (around 02/19/2023) for UBSE, Hx of BCC.  I, Ardis Rowan, RMA, am acting as scribe for Armida Sans, MD .   Documentation: I have reviewed the above documentation for accuracy and completeness, and I agree with the above.  Armida Sans, MD

## 2022-08-20 NOTE — Patient Instructions (Addendum)
Wound Care Instructions  Cleanse wound gently with soap and water once a day then pat dry with clean gauze. Apply a thin coat of Petrolatum (petroleum jelly, "Vaseline") over the wound (unless you have an allergy to this). We recommend that you use a new, sterile tube of Vaseline. Do not pick or remove scabs. Do not remove the yellow or white "healing tissue" from the base of the wound.  Cover the wound with fresh, clean, nonstick gauze and secure with paper tape. You may use Band-Aids in place of gauze and tape if the wound is small enough, but would recommend trimming much of the tape off as there is often too much. Sometimes Band-Aids can irritate the skin.  You should call the office for your biopsy report after 1 week if you have not already been contacted.  If you experience any problems, such as abnormal amounts of bleeding, swelling, significant bruising, significant pain, or evidence of infection, please call the office immediately.  FOR ADULT SURGERY PATIENTS: If you need something for pain relief you may take 1 extra strength Tylenol (acetaminophen) AND 2 Ibuprofen (200mg each) together every 4 hours as needed for pain. (do not take these if you are allergic to them or if you have a reason you should not take them.) Typically, you may only need pain medication for 1 to 3 days.     Due to recent changes in healthcare laws, you may see results of your pathology and/or laboratory studies on MyChart before the doctors have had a chance to review them. We understand that in some cases there may be results that are confusing or concerning to you. Please understand that not all results are received at the same time and often the doctors may need to interpret multiple results in order to provide you with the best plan of care or course of treatment. Therefore, we ask that you please give us 2 business days to thoroughly review all your results before contacting the office for clarification. Should  we see a critical lab result, you will be contacted sooner.   If You Need Anything After Your Visit  If you have any questions or concerns for your doctor, please call our main line at [phone] and press option 4 to reach your doctor's medical assistant. If no one answers, please leave a voicemail as directed and we will return your call as soon as possible. Messages left after 4 pm will be answered the following business day.   You may also send us a message via MyChart. We typically respond to MyChart messages within 1-2 business days.  For prescription refills, please ask your pharmacy to contact our office. Our fax number is [phone].  If you have an urgent issue when the clinic is closed that cannot wait until the next business day, you can page your doctor at the number below.    Please note that while we do our best to be available for urgent issues outside of office hours, we are not available 24/7.   If you have an urgent issue and are unable to reach us, you may choose to seek medical care at your doctor's office, retail clinic, urgent care center, or emergency room.  If you have a medical emergency, please immediately call 911 or go to the emergency department.  Pager Numbers  - Dr. Kowalski: [phone]  - Dr. Moye: [phone]  - Dr. Stewart: [phone]  In the event of inclement weather, please call our main line at   [phone] for an update on the status of any delays or closures.  Dermatology Medication Tips: Please keep the boxes that topical medications come in in order to help keep track of the instructions about where and how to use these. Pharmacies typically print the medication instructions only on the boxes and not directly on the medication tubes.   If your medication is too expensive, please contact our office at [phone] option 4 or send us a message through MyChart.   We are unable to tell what your co-pay for medications will be in  advance as this is different depending on your insurance coverage. However, we may be able to find a substitute medication at lower cost or fill out paperwork to get insurance to cover a needed medication.   If a prior authorization is required to get your medication covered by your insurance company, please allow us 1-2 business days to complete this process.  Drug prices often vary depending on where the prescription is filled and some pharmacies may offer cheaper prices.  The website www.goodrx.com contains coupons for medications through different pharmacies. The prices here do not account for what the cost may be with help from insurance (it may be cheaper with your insurance), but the website can give you the price if you did not use any insurance.  - You can print the associated coupon and take it with your prescription to the pharmacy.  - You may also stop by our office during regular business hours and pick up a GoodRx coupon card.  - If you need your prescription sent electronically to a different pharmacy, notify our office through [facility] MyChart or by phone at [phone] option 4.     Si Usted Necesita Algo Despus de Su Visita  Tambin puede enviarnos un mensaje a travs de MyChart. Por lo general respondemos a los mensajes de MyChart en el transcurso de 1 a 2 das hbiles.  Para renovar recetas, por favor pida a su farmacia que se ponga en contacto con nuestra oficina. Nuestro nmero de fax es el [phone].  Si tiene un asunto urgente cuando la clnica est cerrada y que no puede esperar hasta el siguiente da hbil, puede llamar/localizar a su doctor(a) al nmero que aparece a continuacin.   Por favor, tenga en cuenta que aunque hacemos todo lo posible para estar disponibles para asuntos urgentes fuera del horario de oficina, no estamos disponibles las 24 horas del da, los 7 das de la semana.   Si tiene un problema urgente y no puede comunicarse con nosotros, puede  optar por buscar atencin mdica  en el consultorio de su doctor(a), en una clnica privada, en un centro de atencin urgente o en una sala de emergencias.  Si tiene una emergencia mdica, por favor llame inmediatamente al 911 o vaya a la sala de emergencias.  Nmeros de bper  - Dr. Kowalski: [phone]  - Dra. Moye: [phone]  - Dra. Stewart: [phone]  En caso de inclemencias del tiempo, por favor llame a nuestra lnea principal al [phone] para una actualizacin sobre el estado de cualquier retraso o cierre.  Consejos para la medicacin en dermatologa: Por favor, guarde las cajas en las que vienen los medicamentos de uso tpico para ayudarle a seguir las instrucciones sobre dnde y cmo usarlos. Las farmacias generalmente imprimen las instrucciones del medicamento slo en las cajas y no directamente en los tubos del medicamento.   Si su medicamento es muy caro, por favor, pngase en contacto con   nuestra oficina llamando al [phone] y presione la opcin 4 o envenos un mensaje a travs de MyChart.   No podemos decirle cul ser su copago por los medicamentos por adelantado ya que esto es diferente dependiendo de la cobertura de su seguro. Sin embargo, es posible que podamos encontrar un medicamento sustituto a menor costo o llenar un formulario para que el seguro cubra el medicamento que se considera necesario.   Si se requiere una autorizacin previa para que su compaa de seguros cubra su medicamento, por favor permtanos de 1 a 2 das hbiles para completar este proceso.  Los precios de los medicamentos varan con frecuencia dependiendo del lugar de dnde se surte la receta y alguna farmacias pueden ofrecer precios ms baratos.  El sitio web www.goodrx.com tiene cupones para medicamentos de diferentes farmacias. Los precios aqu no tienen en cuenta lo que podra costar con la ayuda del seguro (puede ser ms barato con su seguro), pero el sitio web puede darle el  precio si no utiliz ningn seguro.  - Puede imprimir el cupn correspondiente y llevarlo con su receta a la farmacia.  - Tambin puede pasar por nuestra oficina durante el horario de atencin regular y recoger una tarjeta de cupones de GoodRx.  - Si necesita que su receta se enve electrnicamente a una farmacia diferente, informe a nuestra oficina a travs de MyChart de [facility] o por telfono llamando al [phone] y presione la opcin 4.  

## 2022-08-25 ENCOUNTER — Telehealth: Payer: Self-pay

## 2022-08-25 ENCOUNTER — Encounter: Payer: Medicare Other | Attending: Physician Assistant | Admitting: Physician Assistant

## 2022-08-25 DIAGNOSIS — S90512A Abrasion, left ankle, initial encounter: Secondary | ICD-10-CM | POA: Diagnosis not present

## 2022-08-25 DIAGNOSIS — L988 Other specified disorders of the skin and subcutaneous tissue: Secondary | ICD-10-CM | POA: Diagnosis not present

## 2022-08-25 DIAGNOSIS — Z87891 Personal history of nicotine dependence: Secondary | ICD-10-CM | POA: Insufficient documentation

## 2022-08-25 DIAGNOSIS — I7389 Other specified peripheral vascular diseases: Secondary | ICD-10-CM | POA: Insufficient documentation

## 2022-08-25 DIAGNOSIS — L97822 Non-pressure chronic ulcer of other part of left lower leg with fat layer exposed: Secondary | ICD-10-CM | POA: Diagnosis not present

## 2022-08-25 DIAGNOSIS — J449 Chronic obstructive pulmonary disease, unspecified: Secondary | ICD-10-CM | POA: Insufficient documentation

## 2022-08-25 NOTE — Telephone Encounter (Signed)
Advised patient of results/hd  

## 2022-08-25 NOTE — Telephone Encounter (Signed)
-----   Message from [name], MD sent at 08/25/2022 12:31 PM EDT ----- Diagnosis 1. Skin , R tricep VERRUCA VULGARIS, ENDOPHYTIC TYPE, INFLAMED 2. Skin , R sup forehead EPIDERMOID CYST, INFLAMED  1- benign viral wart May recur Recheck next visit 2- benign inflamed cyst  Recheck next visit to see if needs further treatment

## 2022-08-26 ENCOUNTER — Ambulatory Visit (INDEPENDENT_AMBULATORY_CARE_PROVIDER_SITE_OTHER): Payer: Medicare Other | Admitting: Dermatology

## 2022-08-26 ENCOUNTER — Encounter: Payer: Self-pay | Admitting: Dermatology

## 2022-08-26 VITALS — BP 134/84 | HR 75

## 2022-08-26 DIAGNOSIS — I7025 Atherosclerosis of native arteries of other extremities with ulceration: Secondary | ICD-10-CM | POA: Diagnosis not present

## 2022-08-26 DIAGNOSIS — L989 Disorder of the skin and subcutaneous tissue, unspecified: Secondary | ICD-10-CM

## 2022-08-26 DIAGNOSIS — D489 Neoplasm of uncertain behavior, unspecified: Secondary | ICD-10-CM

## 2022-08-26 NOTE — Patient Instructions (Addendum)
   Biopsy Wound Care Instructions  Leave the original bandage on for 24 hours if possible.  If the bandage becomes soaked or soiled before that time, it is OK to remove it and examine the wound.  A small amount of post-operative bleeding is normal.  If excessive bleeding occurs, remove the bandage, place gauze over the site and apply continuous pressure (no peeking) over the area for 30 minutes. If this does not work, please call our clinic as soon as possible or page your doctor if it is after hours.   Once a day, cleanse the wound with soap and water. It is fine to shower. If a thick crust develops you may use a Q-tip dipped into dilute hydrogen peroxide (mix 1:1 with water) to dissolve it.  Hydrogen peroxide can slow the healing process, so use it only as needed.    After washing, apply petroleum jelly (Vaseline) or an antibiotic ointment if your doctor prescribed one for you, followed by a bandage.    For best healing, the wound should be covered with a layer of ointment at all times. If you are not able to keep the area covered with a bandage to hold the ointment in place, this may mean re-applying the ointment several times a day.  Continue this wound care until the wound has healed and is no longer open.   Itching and mild discomfort is normal during the healing process. However, if you develop pain or severe itching, please call our office.   If you have any discomfort, you can take Tylenol (acetaminophen) or ibuprofen as directed on the bottle. (Please do not take these if you have an allergy to them or cannot take them for another reason).  Some redness, tenderness and white or yellow material in the wound is normal healing.  If the area becomes very sore and red, or develops a thick yellow-green material (pus), it may be infected; please notify us.    If you have stitches, return to clinic as directed to have the stitches removed. You will continue wound care for 2-3 days after the  stitches are removed.   Wound healing continues for up to one year following surgery. It is not unusual to experience pain in the scar from time to time during the interval.  If the pain becomes severe or the scar thickens, you should notify the office.    A slight amount of redness in a scar is expected for the first six months.  After six months, the redness will fade and the scar will soften and fade.  The color difference becomes less noticeable with time.  If there are any problems, return for a post-op surgery check at your earliest convenience.  To improve the appearance of the scar, you can use silicone scar gel, cream, or sheets (such as Mederma or Serica) every night for up to one year. These are available over the counter (without a prescription).  Please call our office at [phone] for any questions or concerns.     Due to recent changes in healthcare laws, you may see results of your pathology and/or laboratory studies on MyChart before the doctors have had a chance to review them. We understand that in some cases there may be results that are confusing or concerning to you. Please understand that not all results are received at the same time and often the doctors may need to interpret multiple results in order to provide you with the best plan of   care or course of treatment. Therefore, we ask that you please give us 2 business days to thoroughly review all your results before contacting the office for clarification. Should we see a critical lab result, you will be contacted sooner.   If You Need Anything After Your Visit  If you have any questions or concerns for your doctor, please call our main line at [phone] and press option 4 to reach your doctor's medical assistant. If no one answers, please leave a voicemail as directed and we will return your call as soon as possible. Messages left after 4 pm will be answered the following business day.   You may also send us a  message via MyChart. We typically respond to MyChart messages within 1-2 business days.  For prescription refills, please ask your pharmacy to contact our office. Our fax number is [phone].  If you have an urgent issue when the clinic is closed that cannot wait until the next business day, you can page your doctor at the number below.    Please note that while we do our best to be available for urgent issues outside of office hours, we are not available 24/7.   If you have an urgent issue and are unable to reach us, you may choose to seek medical care at your doctor's office, retail clinic, urgent care center, or emergency room.  If you have a medical emergency, please immediately call 911 or go to the emergency department.  Pager Numbers  - Dr. Kowalski: [phone]  - Dr. Moye: [phone]  - Dr. Stewart: [phone]  In the event of inclement weather, please call our main line at [phone] for an update on the status of any delays or closures.  Dermatology Medication Tips: Please keep the boxes that topical medications come in in order to help keep track of the instructions about where and how to use these. Pharmacies typically print the medication instructions only on the boxes and not directly on the medication tubes.   If your medication is too expensive, please contact our office at [phone] option 4 or send us a message through MyChart.   We are unable to tell what your co-pay for medications will be in advance as this is different depending on your insurance coverage. However, we may be able to find a substitute medication at lower cost or fill out paperwork to get insurance to cover a needed medication.   If a prior authorization is required to get your medication covered by your insurance company, please allow us 1-2 business days to complete this process.  Drug prices often vary depending on where the prescription is filled and some pharmacies may offer  cheaper prices.  The website www.goodrx.com contains coupons for medications through different pharmacies. The prices here do not account for what the cost may be with help from insurance (it may be cheaper with your insurance), but the website can give you the price if you did not use any insurance.  - You can print the associated coupon and take it with your prescription to the pharmacy.  - You may also stop by our office during regular business hours and pick up a GoodRx coupon card.  - If you need your prescription sent electronically to a different pharmacy, notify our office through [facility] MyChart or by phone at [phone] option 4.     Si Usted Necesita Algo Despus de Su Visita  Tambin puede enviarnos un mensaje a travs de MyChart. Por lo general   respondemos a los mensajes de MyChart en el transcurso de 1 a 2 das hbiles.  Para renovar recetas, por favor pida a su farmacia que se ponga en contacto con nuestra oficina. Nuestro nmero de fax es el [phone].  Si tiene un asunto urgente cuando la clnica est cerrada y que no puede esperar hasta el siguiente da hbil, puede llamar/localizar a su doctor(a) al nmero que aparece a continuacin.   Por favor, tenga en cuenta que aunque hacemos todo lo posible para estar disponibles para asuntos urgentes fuera del horario de oficina, no estamos disponibles las 24 horas del da, los 7 das de la semana.   Si tiene un problema urgente y no puede comunicarse con nosotros, puede optar por buscar atencin mdica  en el consultorio de su doctor(a), en una clnica privada, en un centro de atencin urgente o en una sala de emergencias.  Si tiene una emergencia mdica, por favor llame inmediatamente al 911 o vaya a la sala de emergencias.  Nmeros de bper  - Dr. Kowalski: [phone]  - Dra. Moye: [phone]  - Dra. Stewart: [phone]  En caso de inclemencias del tiempo, por favor llame a nuestra lnea principal al  [phone] para una actualizacin sobre el estado de cualquier retraso o cierre.  Consejos para la medicacin en dermatologa: Por favor, guarde las cajas en las que vienen los medicamentos de uso tpico para ayudarle a seguir las instrucciones sobre dnde y cmo usarlos. Las farmacias generalmente imprimen las instrucciones del medicamento slo en las cajas y no directamente en los tubos del medicamento.   Si su medicamento es muy caro, por favor, pngase en contacto con nuestra oficina llamando al [phone] y presione la opcin 4 o envenos un mensaje a travs de MyChart.   No podemos decirle cul ser su copago por los medicamentos por adelantado ya que esto es diferente dependiendo de la cobertura de su seguro. Sin embargo, es posible que podamos encontrar un medicamento sustituto a menor costo o llenar un formulario para que el seguro cubra el medicamento que se considera necesario.   Si se requiere una autorizacin previa para que su compaa de seguros cubra su medicamento, por favor permtanos de 1 a 2 das hbiles para completar este proceso.  Los precios de los medicamentos varan con frecuencia dependiendo del lugar de dnde se surte la receta y alguna farmacias pueden ofrecer precios ms baratos.  El sitio web www.goodrx.com tiene cupones para medicamentos de diferentes farmacias. Los precios aqu no tienen en cuenta lo que podra costar con la ayuda del seguro (puede ser ms barato con su seguro), pero el sitio web puede darle el precio si no utiliz ningn seguro.  - Puede imprimir el cupn correspondiente y llevarlo con su receta a la farmacia.  - Tambin puede pasar por nuestra oficina durante el horario de atencin regular y recoger una tarjeta de cupones de GoodRx.  - Si necesita que su receta se enve electrnicamente a una farmacia diferente, informe a nuestra oficina a travs de MyChart [facility] o por telfono llamando al [phone] y presione la opcin 4.  

## 2022-08-26 NOTE — Progress Notes (Addendum)
[name] ([id number]) [id number] Page 1 of 6 Visit Report for 08/25/2022 Allergy List Details Patient Name: Date of Service: Otila Back ID W. 08/25/2022 2:00 PM Medical Record Number: [id number] Patient Account Number: [id number] Date of Birth/Sex: Treating RN: 09/21/1945 (77 y.o. [name] Primary Care Vihaan Gloss: Mila Merry Other Clinician: Referring Darien Mignogna: Treating Shea Kapur/Extender: Marshia Ly in Treatment: 0 Allergies Active Allergies penicillin Reaction: hives Severity: Moderate Allergy Notes Electronic Signature(s) Signed: 08/26/2022 8:21:01 AM By: Midge Aver MSN RN CNS WTA Entered By: Midge Aver on 08/25/2022 14:16:30 -------------------------------------------------------------------------------- Arrival Information Details Patient Name: Date of Service: [name], [name] ID W. 08/25/2022 2:00 PM Medical Record Number: [id number] Patient Account Number: [id number] Date of Birth/Sex: Treating RN: 08-06-1945 (76 y.o. [name] Primary Care Danissa Rundle: Mila Merry Other Clinician: Referring Rashelle Ireland: Treating Shawnay Bramel/Extender: Marshia Ly in Treatment: 0 Visit Information Patient Arrived: Ambulatory Arrival Time: 14:08 Accompanied By: self Transfer Assistance: None Patient Identification Verified: Yes Secondary Verification Process Completed: Yes Patient Requires Transmission-Based Precautions: No Patient Has Alerts: Yes Patient Alerts: Patient on Blood Thinner Plavix and ASA 81 mg ABI R 1.09 TBI .95 ABI L 1.01 TBI .86 Electronic Signature(s) Signed: 08/25/2022 2:31:40 PM By: Midge Aver MSN RN CNS WTA Entered By: Midge Aver on 08/25/2022 14:31:40 -------------------------------------------------------------------------------- Clinic Level of Care Assessment Details Patient Name: Date of Service: [name], [place] ID W. 08/25/2022 2:00 PM Medical Record  Number: [id number] Patient Account Number: [id number] Date of Birth/Sex: Treating RN: 13-Aug-1945 (77 y.o. [name] Primary Care Shaia Porath: Mila Merry Other Clinician: Referring Jose Corvin: Treating Monia Timmers/Extender: Marshia Ly in Treatment: 0 Clinic Level of Care Assessment Items TOOL 1 Quantity Score X- 1 0 Use when EandM and Procedure is performed on INITIAL visit QUANTEL, MCINTURFF ([id number]) [id number] Page 2 of 6 ASSESSMENTS - Nursing Assessment / Reassessment X- 1 20 General Physical Exam (combine w/ comprehensive assessment (listed just below) when performed on new pt. evals) X- 1 25 Comprehensive Assessment (HX, ROS, Risk Assessments, Wounds Hx, etc.) ASSESSMENTS - Wound and Skin Assessment / Reassessment []  - 0 Dermatologic / Skin Assessment (not related to wound area) ASSESSMENTS - Ostomy and/or Continence Assessment and Care []  - 0 Incontinence Assessment and Management []  - 0 Ostomy Care Assessment and Management (repouching, etc.) PROCESS - Coordination of Care X - Simple Patient / Family Education for ongoing care 1 15 []  - 0 Complex (extensive) Patient / Family Education for ongoing care X- 1 10 Staff obtains Chiropractor, Records, T Results / Process Orders est []  - 0 Staff telephones HHA, Nursing Homes / Clarify orders / etc []  - 0 Routine Transfer to another Facility (non-emergent condition) []  - 0 Routine Hospital Admission (non-emergent condition) []  - 0 New Admissions / Manufacturing engineer / Ordering NPWT Apligraf, etc. , []  - 0 Emergency Hospital Admission (emergent condition) PROCESS - Special Needs []  - 0 Pediatric / Minor Patient Management []  - 0 Isolation Patient Management []  - 0 Hearing / Language / Visual special needs []  - 0 Assessment of Community assistance (transportation, D/C planning, etc.) []  - 0 Additional assistance / Altered mentation []  - 0 Support Surface(s)  Assessment (bed, cushion, seat, etc.) INTERVENTIONS - Miscellaneous []  - 0 External ear exam []  - 0 Patient Transfer (multiple staff / Nurse, adult / Similar devices) []  - 0 Simple Staple / Suture removal (25 or less) []  - 0 Complex Staple / Suture removal (26 or more) []  -  0 Hypo/Hyperglycemic Management (do not check if billed separately)  - 0 Ankle / Brachial Index (ABI) - do not check if billed separately Has the patient been seen at the hospital within the last three years: Yes Total Score: 70 Level Of Care: New/Established - Level 2 Electronic Signature(s) Signed: 08/26/2022 8:21:01 AM By: Midge Aver MSN RN CNS WTA Entered By: Midge Aver on 08/25/2022 15:04:06 -------------------------------------------------------------------------------- Encounter Discharge Information Details Patient Name: Date of Service: [name], [name] ID W. 08/25/2022 2:00 PM Medical Record Number: [id number] Patient Account Number: [id number] Date of Birth/Sex: Treating RN: 06/27/1945 (77 y.o. [name] Primary Care Calieb Lichtman: Mila Merry Other Clinician: Referring Terressa Evola: Treating Vivica Dobosz/Extender: Marshia Ly in Treatment: 0 Encounter Discharge Information Items Post Procedure Vitals Discharge Condition: Stable Temperature (F): 98.1 Ambulatory Status: Ambulatory Pulse (bpm): 66 Discharge Destination: Home Respiratory Rate (breaths/min): [address], [place] W ([id number]) [id number] Page 3 of 6 Transportation: Private Auto Blood Pressure (mmHg): 149/81 Accompanied By: self Schedule Follow-up Appointment: No Clinical Summary of Care: Electronic Signature(s) Signed: 08/26/2022 8:21:01 AM By: Midge Aver MSN RN CNS WTA Entered By: Midge Aver on 08/25/2022 15:05:12 -------------------------------------------------------------------------------- Lower Extremity Assessment Details Patient Name: Date of Service: [name], [name] ID  W. 08/25/2022 2:00 PM Medical Record Number: [id number] Patient Account Number: [id number] Date of Birth/Sex: Treating RN: [birth year] (77 y.o. [name] Primary Care Dottie Vaquerano: Mila Merry Other Clinician: Referring Darius Fillingim: Treating Ignacia Gentzler/Extender: Marshia Ly in Treatment: 0 Electronic Signature(s) Signed: 08/26/2022 8:21:01 AM By: Midge Aver MSN RN CNS WTA Previous Signature: 08/25/2022 2:32:40 PM Version By: Midge Aver MSN RN CNS WTA Entered By: Midge Aver on 08/25/2022 15:06:54 -------------------------------------------------------------------------------- Multi Wound Chart Details Patient Name: Date of Service: [name], [name] ID W. 08/25/2022 2:00 PM Medical Record Number: [id number] Patient Account Number: [id number] Date of Birth/Sex: Treating RN: 1946-01-26 (76 y.o. [name] Primary Care Margaret Cockerill: Mila Merry Other Clinician: Referring Haelee Bolen: Treating Naoma Boxell/Extender: Marshia Ly in Treatment: 0 Vital Signs Height(in): 73 Pulse(bpm): 66 Weight(lbs): 225 Blood Pressure(mmHg): 149/81 Body Mass Index(BMI): 29.7 Temperature(F): 98.1 Respiratory Rate(breaths/min): 16 [1:Photos:] [N/A:N/A] Left, Lateral Ankle N/A N/A Wound Location: Gradually Appeared N/A N/A Wounding Event: Abrasion N/A N/A Primary Etiology: Chronic Obstructive Pulmonary N/A N/A Comorbid History: Disease (COPD), Sleep Apnea, Peripheral Arterial Disease 09/16/2021 N/A N/A Date Acquired: 0 N/A N/A Weeks of Treatment: Open N/A N/A Wound Status: No N/A N/A Wound Recurrence: 0.8x0.7x0.1 N/A N/A Measurements L x W x D (cm) 0.44 N/A N/A A (cm) : rea 0.044 N/A N/A Volume (cm) : Full Thickness Without Exposed N/A N/A Classification: Support Structures None Present N/A N/A Exudate Amount: None Present (0%) N/A N/A Granulation Amount: None Present (0%) N/A N/A Necrotic Amount: Fascia: No N/A N/A Exposed  Structures: [name] ([id number]) [id number] Page 4 of 6 Fat Layer (Subcutaneous Tissue): No Tendon: No Muscle: No Joint: No Bone: No None N/A N/A Epithelialization: Treatment Notes Electronic Signature(s) Signed: 08/26/2022 8:21:01 AM By: Midge Aver MSN RN CNS WTA Entered By: Midge Aver on 08/25/2022 14:49:28 -------------------------------------------------------------------------------- Multi-Disciplinary Care Plan Details Patient Name: Date of Service: [name], [name] ID W. 08/25/2022 2:00 PM Medical Record Number: [id number] Patient Account Number: [id number] Date of Birth/Sex: Treating RN: 1946-04-13 (77 y.o. [name] Primary Care Danilyn Cocke: Mila Merry Other Clinician: Referring Cedarius Kersh: Treating Toya Palacios/Extender: Marshia Ly in Treatment: 0 Active Inactive Electronic Signature(s) Signed: 08/26/2022 9:54:32 AM By: Midge Aver MSN RN CNS Margarita Rana  Previous Signature: 08/25/2022 2:34:50 PM Version By: Midge Aver MSN RN CNS WTA Entered By: Midge Aver on 08/26/2022 09:54:32 -------------------------------------------------------------------------------- Pain Assessment Details Patient Name: Date of Service: [name], DA V ID W. 08/25/2022 2:00 PM Medical Record Number: [id number] Patient Account Number: [id number] Date of Birth/Sex: Treating RN: Jan 04, 1946 (77 y.o. [name] Primary Care Marshall Kampf: Mila Merry Other Clinician: Referring Tanyla Stege: Treating Azalyn Sliwa/Extender: Marshia Ly in Treatment: 0 Active Problems Location of Pain Severity and Description of Pain Patient Has Paino Yes Site Locations Pain Location: Pain in Ulcers Rate the pain. Current Pain Level: 4 Worst Pain Level: 8 Least Pain Level: 2 Tolerable Pain Level: 4 Pain Management and Medication Current Pain Management: [name] ([id number]) [id number] Page 5 of  6 Electronic Signature(s) Signed: 08/25/2022 2:31:45 PM By: Midge Aver MSN RN CNS WTA Entered By: Midge Aver on 08/25/2022 14:31:45 -------------------------------------------------------------------------------- Patient/Caregiver Education Details Patient Name: Date of Service: [name], Georgiana Shore 4/16/2024andnbsp2:00 PM Medical Record Number: [id number] Patient Account Number: [id number] Date of Birth/Gender: Treating RN: [birth year] (76 y.o. [name] Primary Care Physician: Mila Merry Other Clinician: Referring Physician: Treating Physician/Extender: Marshia Ly in Treatment: 0 Education Assessment Education Provided To: Patient Education Topics Provided Welcome [facility] Packet: o Handouts: Welcome T [facility] o Methods: Printed Responses: State content correctly Wound/Skin Impairment: Handouts: Caring for Your Ulcer Methods: Explain/Verbal, Printed Responses: State content correctly Electronic Signature(s) Signed: 08/26/2022 8:21:01 AM By: Midge Aver MSN RN CNS WTA Entered By: Midge Aver on 08/25/2022 14:35:45 -------------------------------------------------------------------------------- Wound Assessment Details Patient Name: Date of Service: [name], [name] ID W. 08/25/2022 2:00 PM Medical Record Number: [id number] Patient Account Number: [id number] Date of Birth/Sex: Treating RN: 1945/06/06 (77 y.o. [name] Primary Care Bruin Bolger: Mila Merry Other Clinician: Referring Denzil Mceachron: Treating Myda Detwiler/Extender: Marshia Ly in Treatment: 0 Wound Status Wound Number: 1 Primary Atypical Etiology: Wound Location: Left, Lateral Malleolus Wound Open Wounding Event: Gradually Appeared Status: Date Acquired: 09/16/2021 Comorbid Chronic Obstructive Pulmonary Disease (COPD), Sleep Apnea, Weeks Of Treatment: 0 History: Peripheral Arterial Disease Clustered Wound:  No Photos Wound Measurements [name] ([id number]) Length: (cm) 0.8 Width: (cm) 0.7 Depth: (cm) 0.1 Area: (cm) 0.44 Volume: (cm) 0.044 [id number] Page 6 of 6 % Reduction in Area: % Reduction in Volume: Epithelialization: None Wound Description Classification: Full Thickness Without Exposed Support Structures Exudate Amount: None Present Foul Odor After Cleansing: No Slough/Fibrino No Wound Bed Granulation Amount: None Present (0%) Exposed Structure Necrotic Amount: None Present (0%) Fascia Exposed: No Fat Layer (Subcutaneous Tissue) Exposed: No Tendon Exposed: No Muscle Exposed: No Joint Exposed: No Bone Exposed: No Electronic Signature(s) Signed: 08/26/2022 8:21:01 AM By: Midge Aver MSN RN CNS WTA Entered By: Midge Aver on 08/25/2022 15:06:43 -------------------------------------------------------------------------------- Vitals Details Patient Name: Date of Service: [name], DA V ID W. 08/25/2022 2:00 PM Medical Record Number: [id number] Patient Account Number: [id number] Date of Birth/Sex: Treating RN: Aug 06, 1945 (77 y.o. [name] Primary Care Lynisha Osuch: Mila Merry Other Clinician: Referring Man Bonneau: Treating Arieana Somoza/Extender: Marshia Ly in Treatment: 0 Vital Signs Time Taken: 14:13 Temperature (F): 98.1 Height (in): 73 Pulse (bpm): 66 Source: Stated Respiratory Rate (breaths/min): 16 Weight (lbs): 225 Blood Pressure (mmHg): 149/81 Source: Stated Reference Range: 80 - 120 mg / dl Body Mass Index (BMI): 29.7 Electronic Signature(s) Signed: 08/25/2022 2:31:55 PM By: Midge Aver MSN RN CNS WTA Entered By: Midge Aver on  08/25/2022 14:31:55 

## 2022-08-26 NOTE — Progress Notes (Signed)
   Follow-Up Visit   Subjective  [name] is a 77 y.o. male who presents for the following: spot at left ankle that has been there for over a year is painful when touched. Patient has a history of circulation issues with left leg, has had bypass as well as stent surgery in the past year.   The following portions of the chart were reviewed this encounter and updated as appropriate: medications, allergies, medical history  Review of Systems:  No other skin or systemic complaints except as noted in HPI or Assessment and Plan.  Objective  Well appearing patient in no apparent distress; mood and affect are within normal limits.  A focused examination was performed of the following areas: Left leg  Relevant exam findings are noted in the Assessment and Plan.  Left Lateral Ankle 2.5 cm pink patch with crusted area centrally 1.2 cm         Assessment & Plan   History of Circulation issue status post recent bypass surgery at left leg Pt had ulcer in this area.  Seems to have improved somewhat since bypass surgery, but still persistent. Wound Center is concerned and would like it biopsied.  Treatment Plan: History of stent last year on left leg History of bypass surgery January on left leg  Neoplasm of uncertain behavior - persistent Ulcer /plaque - likely healing ulcer  R/O skin cancer Left Lateral Ankle  Skin / nail biopsy Type of biopsy: tangential   Informed consent: discussed and consent obtained   Patient was prepped and draped in usual sterile fashion: Area prepped with alcohol. Anesthesia: the lesion was anesthetized in a standard fashion   Anesthetic:  1% lidocaine w/ epinephrine 1-100,000 buffered w/ 8.4% NaHCO3 Instrument used: flexible razor blade   Hemostasis achieved with: pressure, aluminum chloride and electrodesiccation   Outcome: patient tolerated procedure well   Post-procedure details: wound care instructions given   Post-procedure details  comment:  Ointment and small bandage applied  Specimen 1 - Surgical pathology Differential Diagnosis: R/o skin cancer vs Ulcer from poor circulation vs other  Check Margins: No  R/o skin cancer vs leg ulcer related to poor circulation vs other   Return for keep follow up as scheduled .  IAsher Muir, CMA, am acting as scribe for Armida Sans, MD.  Documentation: I have reviewed the above documentation for accuracy and completeness, and I agree with the above.  Armida Sans, MD

## 2022-08-26 NOTE — Progress Notes (Signed)
FIELDS, OROS ([id number]) [phone] [id number] Page 1 of 4 Visit Report for 08/25/2022 Abuse Risk Screen Details Patient Name: Date of Service: [name] ID W. 08/25/2022 2:00 PM Medical Record Number: [id number] Patient Account Number: [id number] Date of Birth/Sex: Treating RN: 1945-07-24 (77 y.o. [name] Primary Care Loy Mccartt: Mila Merry Other Clinician: Referring Chong January: Treating Izola Teague/Extender: Marshia Ly in Treatment: 0 Abuse Risk Screen Items Answer Electronic Signature(s) Signed: 08/26/2022 8:21:01 AM By: Midge Aver MSN RN CNS WTA Entered By: Midge Aver on 08/25/2022 14:19:38 -------------------------------------------------------------------------------- Activities of Daily Living Details Patient Name: Date of Service: [name] ID W. 08/25/2022 2:00 PM Medical Record Number: [id number] Patient Account Number: [id number] Date of Birth/Sex: Treating RN: April 25, 1946 (77 y.o. [name] Primary Care Shelisha Gautier: Mila Merry Other Clinician: Referring Yarianna Varble: Treating Kimberlyn Quiocho/Extender: Marshia Ly in Treatment: 0 Activities of Daily Living Items Answer Activities of Daily Living (Please select one for each item) Drive Automobile Completely Able T Medications ake Completely Able Use T elephone Completely Able Care for Appearance Completely Able Use T oilet Completely Able Bath / Shower Completely Able Dress Self Completely Able Feed Self Completely Able Walk Completely Able Get In / Out Bed Completely Able Housework Completely Able Prepare Meals Completely Able Handle Money Completely Able Shop for Self Completely Able Electronic Signature(s) Signed: 08/26/2022 8:21:01 AM By: Midge Aver MSN RN CNS WTA Entered By: Midge Aver on 08/25/2022 14:19:53 -------------------------------------------------------------------------------- Education Screening  Details Patient Name: Date of Service: [name], [name] ID W. 08/25/2022 2:00 PM Medical Record Number: [id number] Patient Account Number: [id number] Date of Birth/Sex: Treating RN: [birth year] (76 y.o. [name] Primary Care Margarette Vannatter: Mila Merry Other Clinician: Referring Jonice Cerra: Treating Trinadee Verhagen/Extender: Marshia Ly in Treatment: 0 Learning Preferences/Education Level/Primary Language Highest Education Level: College or Above [place], LEIF LOFLIN ([id number]) [id number] Page 2 of 4 Preferred Language: English Cognitive Barrier Language Barrier: No Translator Needed: No Memory Deficit: No Emotional Barrier: No Cultural/Religious Beliefs Affecting Medical Care: No Physical Barrier Impaired Vision: No Impaired Hearing: No Decreased Hand dexterity: No Knowledge/Comprehension Knowledge Level: High Comprehension Level: High Ability to understand written instructions: High Ability to understand verbal instructions: High Motivation Anxiety Level: Calm Cooperation: Cooperative Education Importance: Acknowledges Need Interest in Health Problems: Asks Questions Perception: Coherent Willingness to Engage in Self-Management High Activities: Readiness to Engage in Self-Management High Activities: Electronic Signature(s) Signed: 08/26/2022 8:21:01 AM By: Midge Aver MSN RN CNS WTA Entered By: Midge Aver on 08/25/2022 14:20:18 -------------------------------------------------------------------------------- Fall Risk Assessment Details Patient Name: Date of Service: [name], DA V ID W. 08/25/2022 2:00 PM Medical Record Number: [id number] Patient Account Number: [id number] Date of Birth/Sex: Treating RN: 04-20-1946 (77 y.o. [name] Primary Care Reginna Sermeno: Mila Merry Other Clinician: Referring Cerena Baine: Treating Ridley Dileo/Extender: Marshia Ly in Treatment: 0 Fall Risk Assessment  Items Have you had 2 or more falls in the last 12 monthso 0 No Have you had any fall that resulted in injury in the last 12 monthso 0 No FALLS RISK SCREEN History of falling - immediate or within 3 months 0 No Secondary diagnosis (Do you have 2 or more medical diagnoseso) 0 No Ambulatory aid None/bed rest/wheelchair/nurse 0 No Crutches/cane/walker 0 No Furniture 0 No Intravenous therapy Access/Saline/Heparin Lock 0 No Gait/Transferring Normal/ bed rest/ wheelchair 0 No Weak (short steps with or without shuffle, stooped but able to lift head while walking, may seek 0 No support from furniture)  Impaired (short steps with shuffle, may have difficulty arising from chair, head down, impaired 0 No balance) Mental Status Oriented to own ability 0 Yes Electronic Signature(s) Signed: 08/26/2022 8:21:01 AM By: Midge Aver MSN RN CNS WTA Entered By: Midge Aver on 08/25/2022 14:20:44 Christians, Rondrick W ([id number]) [phone] [id number] Page 3 of 4 -------------------------------------------------------------------------------- Foot Assessment Details Patient Name: Date of Service: [name], DA V ID W. 08/25/2022 2:00 PM Medical Record Number: [id number] Patient Account Number: [id number] Date of Birth/Sex: Treating RN: 02/17/1946 (77 y.o. [name] Primary Care Jessah Danser: Mila Merry Other Clinician: Referring Khiree Bukhari: Treating Mikiah Durall/Extender: Marshia Ly in Treatment: 0 Foot Assessment Items Site Locations + = Sensation present, - = Sensation absent, C = Callus, U = Ulcer R = Redness, W = Warmth, M = Maceration, PU = Pre-ulcerative lesion F = Fissure, S = Swelling, D = Dryness Assessment Right: Left: Other Deformity: No No Prior Foot Ulcer: Yes No Prior Amputation: No No Charcot Joint: No No Ambulatory Status: Ambulatory Without Help Gait: Steady Electronic Signature(s) Signed: 08/26/2022 8:21:01 AM By: Midge Aver MSN  RN CNS WTA Entered By: Midge Aver on 08/25/2022 14:21:16 -------------------------------------------------------------------------------- Nutrition Risk Screening Details Patient Name: Date of Service: [name], [name] ID W. 08/25/2022 2:00 PM Medical Record Number: [id number] Patient Account Number: [id number] Date of Birth/Sex: Treating RN: [birth year] (77 y.o. [name] Primary Care Ra Pfiester: Mila Merry Other Clinician: Referring Tahir Blank: Treating Syleena Mchan/Extender: Marshia Ly in Treatment: 0 Height (in): 73 Weight (lbs): 225 Body Mass Index (BMI): 29.7 Nutrition Risk Screening Items Score Screening NUTRITION RISK SCREEN: I have an illness or condition that made me change the kind and/or amount of food I eat 0 No JODI, KAPPES ([id number]) [id number] [id number] Page 4 of 4 I eat fewer than two meals per day 0 No I eat few fruits and vegetables, or milk products 0 No I have three or more drinks of beer, liquor or wine almost every day 0 No I have tooth or mouth problems that make it hard for me to eat 0 No I don't always have enough money to buy the food I need 0 No I eat alone most of the time 0 No I take three or more different prescribed or over-the-counter drugs a day 0 No Without wanting to, I have lost or gained 10 pounds in the last six months 0 No I am not always physically able to shop, cook and/or feed myself 0 No Nutrition Protocols Good Risk Protocol 0 No interventions needed Moderate Risk Protocol High Risk Proctocol Risk Level: Good Risk Score: 0 Electronic Signature(s) Signed: 08/26/2022 8:21:01 AM By: Midge Aver MSN RN CNS WTA Entered By: Midge Aver on 08/25/2022 14:20:58

## 2022-08-26 NOTE — Progress Notes (Signed)
[name], [name] ([id number]) [id number] Page 1 of 8 Visit Report for 08/25/2022 Chief Complaint Document Details Patient Name: Date of Service: [name] ID Pratt. 08/25/2022 2:00 PM Medical Record Number: [id number] Patient Account Number: [id number] Date of Birth/Sex: Treating RN: 1945/10/16 (77 y.o. [name] Primary Care Provider: Mila Merry Other Clinician: Referring Provider: Treating Provider/Extender: Marshia Ly in Treatment: 0 Information Obtained from: Patient Chief Complaint Left lateral ankle ulcer Electronic Signature(s) Signed: 08/25/2022 2:44:07 PM By: Allen Derry PA-C Entered By: Allen Derry on 08/25/2022 14:44:07 -------------------------------------------------------------------------------- Debridement Details Patient Name: Date of Service: [name], [name] ID Pratt. 08/25/2022 2:00 PM Medical Record Number: [id number] Patient Account Number: [id number] Date of Birth/Sex: Treating RN: 03-Sep-1945 (76 y.o. [name] Primary Care Provider: Mila Merry Other Clinician: Referring Provider: Treating Provider/Extender: Marshia Ly in Treatment: 0 Debridement Performed for Assessment: Wound #1 Left,Lateral Malleolus Performed By: Physician Allen Derry, PA-C Debridement Type: Debridement Level of Consciousness (Pre-procedure): Awake and Alert Pre-procedure Verification/Time Out Yes - 14:54 Taken: Start Time: 14:54 T Area Debrided (L x Pratt): otal 0.8 (cm) x 0.7 (cm) = 0.56 (cm) Tissue and other material debrided: Non-Viable, Skin: Dermis , Skin: Epidermis Level: Skin/Epidermis Debridement Description: Selective/Open Wound Instrument: Curette Bleeding: None Procedural Pain: 3 Post Procedural Pain: 3 Response to Treatment: Procedure was tolerated well Level of Consciousness (Post- Awake and Alert procedure): Post Debridement Measurements of Total Wound Length: (cm)  0.8 Width: (cm) 0.7 Depth: (cm) 0.2 Volume: (cm) 0.088 Character of Wound/Ulcer Post Debridement: Stable Post Procedure Diagnosis Same as Pre-procedure Electronic Signature(s) Signed: 08/25/2022 4:53:35 PM By: Allen Derry PA-C Signed: 08/26/2022 8:21:01 AM By: Midge Aver MSN RN CNS WTA Entered By: Midge Aver on 08/25/2022 14:55:39 [name], [name] ([id number]) [id number] Page 2 of 8 -------------------------------------------------------------------------------- HPI Details Patient Name: Date of Service: [name], [name] ID Pratt. 08/25/2022 2:00 PM Medical Record Number: [id number] Patient Account Number: [id number] Date of Birth/Sex: Treating RN: 14-Dec-1945 (77 y.o. [name] Primary Care Provider: Mila Merry Other Clinician: Referring Provider: Treating Provider/Extender: Marshia Ly in Treatment: 0 History of Present Illness HPI Description: 08-25-2022 upon evaluation today patient presents for initial evaluation here in the clinic concerning an area on his left lateral ankle which has been present he tells me for about a year. He tells me there really has not been any drainage that he can speak of at any point in time. With that being said the patient also tells me that he has unfortunately had some pretty significant issues with his blood flow which eventually was taken care of by Dr. Gilda Crease at [facility] vein and vascular. His ABIs most recently were 1.09 on the left with a TBI of 0.86 and a 1.01 ABI on the right with a TBI of 0.95 and this was on 3- 25-2024. This is after an initial femoral to tibial bypass graft on 05-13-2022 followed by an angiogram March 5 due to the fact that part of this had apparently clotted off and subsequently ended up having to have a stent placed. Nonetheless he seems to be doing quite well at this point I see no signs of peripheral vascular occlusion or issues here and to be peripherally honest I  think that we are on a good track in that regard. Nonetheless I do think that the primary major issue here is simply this abnormal lesion which is very atypical on the lateral aspect of the left ankle which  is the primary concern. Electronic Signature(s) Signed: 08/25/2022 4:23:47 PM By: Allen Derry PA-C Entered By: Allen Derry on 08/25/2022 16:23:46 -------------------------------------------------------------------------------- Physical Exam Details Patient Name: Date of Service: [name], [name] ID Pratt. 08/25/2022 2:00 PM Medical Record Number: [id number] Patient Account Number: [id number] Date of Birth/Sex: Treating RN: [birth year] (77 y.o. [name] Primary Care Provider: Mila Merry Other Clinician: Referring Provider: Treating Provider/Extender: Marshia Ly in Treatment: 0 Constitutional sitting or standing blood pressure is within target range for patient.. pulse regular and within target range for patient.Marland Kitchen respirations regular, non-labored and within target range for patient.Marland Kitchen temperature within target range for patient.. Well-nourished and well-hydrated in no acute distress. Eyes conjunctiva clear no eyelid edema noted. pupils equal round and reactive to light and accommodation. Ears, Nose, Mouth, and Throat no gross abnormality of ear auricles or external auditory canals. normal hearing noted during conversation. mucus membranes moist. Respiratory normal breathing without difficulty. Cardiovascular 2+ dorsalis pedis/posterior tibialis pulses. no clubbing, cyanosis, significant edema, <3 sec cap refill. Musculoskeletal normal gait and posture. no significant deformity or arthritic changes, no loss or range of motion, no clubbing. Psychiatric this patient is able to make decisions and demonstrates good insight into disease process. Alert and Oriented x 3. pleasant and cooperative. Notes Upon inspection patient's wound bed actually showed signs  really of just some dry and scaly tissue which was almost hyper keratinized that was building up on the surface of the area in question. I actually used a curette to gently clear this away. The keratinized tissue was completely removed underneath looks to be doing very well I did not see anything that appeared to be open at this point which is good news. He was having however a tremendous amount of pain and I definitely believe he was hurting. I do not see anything here that makes me concerned about infection per se and he seems to have good blood flow. I am therefore a little unsure of exactly what is going on as this is definitely not an ulceration in the leg it is an atypical lesion and potentially this could be a precancerous or cancerous lesion. I think that the patient likely needs to see dermatology as soon as possible in order to have this likely biopsied and further evaluated/classified. He does not really have any significant lower extremity edema and the good news is he now has good arterial flow so that is not affecting anything either. Electronic Signature(s) Signed: 08/25/2022 4:25:06 PM By: Allen Derry PA-C Entered By: Allen Derry on 08/25/2022 16:25:06 [name], [name] ([id number]) [id number] Page 3 of 8 -------------------------------------------------------------------------------- Physician Orders Details Patient Name: Date of Service: Christopher Lodge. 08/25/2022 2:00 PM Medical Record Number: [id number] Patient Account Number: [id number] Date of Birth/Sex: Treating RN: [birth year] (77 y.o. [name] Primary Care Provider: Mila Merry Other Clinician: Referring Provider: Treating Provider/Extender: Marshia Ly in Treatment: 0 Verbal / Phone Orders: No Diagnosis Coding ICD-10 Coding Code Description L98.8 Other specified disorders of the skin and subcutaneous tissue L97.822 Non-pressure chronic ulcer of other  part of left lower leg with fat layer exposed I73.89 Other specified peripheral vascular diseases J44.9 Chronic obstructive pulmonary disease, unspecified Discharge From [facility] Transfer Care to other provider - Leonard Schwartz will call your dermatologist to recommend a biopsy of the area. Follow up with [place] after if needed for wound healing. Consult Only Wound Treatment Electronic Signature(s) Signed: 08/25/2022 4:53:35 PM By: Allen Derry PA-C Signed:  08/26/2022 8:21:01 AM By: Midge Aver MSN RN CNS WTA Entered By: Midge Aver on 08/25/2022 15:02:10 -------------------------------------------------------------------------------- Problem List Details Patient Name: Date of Service: [name], [name] ID Pratt. 08/25/2022 2:00 PM Medical Record Number: [id number] Patient Account Number: [id number] Date of Birth/Sex: Treating RN: Apr 02, 1946 (77 y.o. [name] Primary Care Provider: Mila Merry Other Clinician: Referring Provider: Treating Provider/Extender: Marshia Ly in Treatment: 0 Active Problems ICD-10 Encounter Code Description Active Date MDM Diagnosis L98.8 Other specified disorders of the skin and subcutaneous tissue 08/25/2022 No Yes L97.822 Non-pressure chronic ulcer of other part of left lower leg with fat layer exposed4/16/2024 No Yes I73.89 Other specified peripheral vascular diseases 08/25/2022 No Yes J44.9 Chronic obstructive pulmonary disease, unspecified 08/25/2022 No Yes Inactive Problems Resolved Problems Electronic Signature(s) Signed: 08/25/2022 4:06:44 PM By: Midge Aver MSN RN CNS WTA Signed: 08/25/2022 4:53:35 PM By: Marcy Salvo, [name] ([id number]) By: Allen Derry PA-C [id number] Page 4 of 8 Signed: 08/25/2022 4:53:35 PM Previous Signature: 08/25/2022 2:43:50 PM Version By: Allen Derry PA-C Entered By: Midge Aver on 08/25/2022  16:06:44 -------------------------------------------------------------------------------- Progress Note Details Patient Name: Date of Service: [name], [name] ID Pratt. 08/25/2022 2:00 PM Medical Record Number: [id number] Patient Account Number: [id number] Date of Birth/Sex: Treating RN: 1946/03/05 (77 y.o. [name] Primary Care Provider: Mila Merry Other Clinician: Referring Provider: Treating Provider/Extender: Marshia Ly in Treatment: 0 Subjective Chief Complaint Information obtained from Patient Left lateral ankle ulcer History of Present Illness (HPI) 08-25-2022 upon evaluation today patient presents for initial evaluation here in the clinic concerning an area on his left lateral ankle which has been present he tells me for about a year. He tells me there really has not been any drainage that he can speak of at any point in time. With that being said the patient also tells me that he has unfortunately had some pretty significant issues with his blood flow which eventually was taken care of by Dr. Gilda Crease at [facility] vein and vascular. His ABIs most recently were 1.09 on the left with a TBI of 0.86 and a 1.01 ABI on the right with a TBI of 0.95 and this was on 08-03-2022. This is after an initial femoral to tibial bypass graft on 05-13-2022 followed by an angiogram March 5 due to the fact that part of this had apparently clotted off and subsequently ended up having to have a stent placed. Nonetheless he seems to be doing quite well at this point I see no signs of peripheral vascular occlusion or issues here and to be peripherally honest I think that we are on a good track in that regard. Nonetheless I do think that the primary major issue here is simply this abnormal lesion which is very atypical on the lateral aspect of the left ankle which is the primary concern. Patient History Information obtained from Patient. Allergies penicillin (Severity: Moderate,  Reaction: hives) Social History Former smoker, Marital Status - Married, Alcohol Use - Daily, Drug Use - No History, Caffeine Use - Moderate. Medical History Respiratory Patient has history of Chronic Obstructive Pulmonary Disease (COPD), Sleep Apnea Cardiovascular Patient has history of Peripheral Arterial Disease Endocrine Denies history of Type I Diabetes, Type II Diabetes Review of Systems (ROS) Constitutional Symptoms (General Health) Denies complaints or symptoms of Fatigue, Fever, Chills, Marked Weight Change. Eyes Denies complaints or symptoms of Dry Eyes, Vision Changes, Glasses / Contacts. Ear/Nose/Mouth/Throat Denies complaints or symptoms of Difficult clearing ears,  Sinusitis. Hematologic/Lymphatic Denies complaints or symptoms of Bleeding / Clotting Disorders, Human Immunodeficiency Virus. Cardiovascular Denies complaints or symptoms of Chest pain, LE edema. Gastrointestinal Denies complaints or symptoms of Frequent diarrhea, Nausea, Vomiting. Endocrine Denies complaints or symptoms of Hepatitis, Thyroid disease, Polydypsia (Excessive Thirst). Genitourinary Denies complaints or symptoms of Kidney failure/ Dialysis, Incontinence/dribbling. Immunological Denies complaints or symptoms of Hives, Itching. Integumentary (Skin) Denies complaints or symptoms of Wounds, Bleeding or bruising tendency, Breakdown, Swelling. Musculoskeletal Denies complaints or symptoms of Muscle Pain, Muscle Weakness. Neurologic Denies complaints or symptoms of Numbness/parasthesias, Focal/Weakness. Psychiatric Denies complaints or symptoms of Anxiety, Claustrophobia. [name], [name] ([id number]) [id number] Page 5 of 8 Objective Constitutional sitting or standing blood pressure is within target range for patient.. pulse regular and within target range for patient.Marland Kitchen respirations regular, non-labored and within target range for patient.Marland Kitchen temperature within target  range for patient.. Well-nourished and well-hydrated in no acute distress. Vitals Time Taken: 2:13 PM, Height: 73 in, Source: Stated, Weight: 225 lbs, Source: Stated, BMI: 29.7, Temperature: 98.1 F, Pulse: 66 bpm, Respiratory Rate: 16 breaths/min, Blood Pressure: 149/81 mmHg. Eyes conjunctiva clear no eyelid edema noted. pupils equal round and reactive to light and accommodation. Ears, Nose, Mouth, and Throat no gross abnormality of ear auricles or external auditory canals. normal hearing noted during conversation. mucus membranes moist. Respiratory normal breathing without difficulty. Cardiovascular 2+ dorsalis pedis/posterior tibialis pulses. no clubbing, cyanosis, significant edema, Musculoskeletal normal gait and posture. no significant deformity or arthritic changes, no loss or range of motion, no clubbing. Psychiatric this patient is able to make decisions and demonstrates good insight into disease process. Alert and Oriented x 3. pleasant and cooperative. General Notes: Upon inspection patient's wound bed actually showed signs really of just some dry and scaly tissue which was almost hyper keratinized that was building up on the surface of the area in question. I actually used a curette to gently clear this away. The keratinized tissue was completely removed underneath looks to be doing very well I did not see anything that appeared to be open at this point which is good news. He was having however a tremendous amount of pain and I definitely believe he was hurting. I do not see anything here that makes me concerned about infection per se and he seems to have good blood flow. I am therefore a little unsure of exactly what is going on as this is definitely not an ulceration in the leg it is an atypical lesion and potentially this could be a precancerous or cancerous lesion. I think that the patient likely needs to see dermatology as soon as possible in order to have this likely  biopsied and further evaluated/classified. He does not really have any significant lower extremity edema and the good news is he now has good arterial flow so that is not affecting anything either. Integumentary (Hair, Skin) Wound #1 status is Open. Original cause of wound was Gradually Appeared. The date acquired was: 09/16/2021. The wound is located on the Left,Lateral Malleolus. The wound measures 0.8cm length x 0.7cm width x 0.1cm depth; 0.44cm^2 area and 0.044cm^3 volume. There is a none present amount of drainage noted. There is no granulation within the wound bed. There is no necrotic tissue within the wound bed. Assessment Active Problems ICD-10 Other specified disorders of the skin and subcutaneous tissue Non-pressure chronic ulcer of other part of left lower leg with fat layer exposed Other specified peripheral vascular diseases Chronic obstructive pulmonary disease, unspecified Procedures Wound #1 Pre-procedure diagnosis  of Wound #1 is an Abrasion located on the Left,Lateral Malleolus . There was a Selective/Open Wound Skin/Epidermis Debridement with a total area of 0.56 sq cm performed by Allen Derry, PA-C. With the following instrument(s): Curette to remove Non-Viable tissue/material. Material removed includes Skin: Dermis and Skin: Epidermis and. No specimens were taken. A time out was conducted at 14:54, prior to the start of the procedure. There was no bleeding. The procedure was tolerated well with a pain level of 3 throughout and a pain level of 3 following the procedure. Post Debridement Measurements: 0.8cm length x 0.7cm width x 0.2cm depth; 0.088cm^3 volume. Character of Wound/Ulcer Post Debridement is stable. Post procedure Diagnosis Wound #1: Same as Pre-Procedure Plan Discharge From [facility] Services: Transfer Care to other provider - Leonard Schwartz will call your dermatologist to recommend a biopsy of the area. Follow up with [place] after if needed for wound healing. Consult Only 1.  Based on what I am seeing I do believe that the patient is doing quite well in regard to his ankle from a wound standpoint I see nothing open. 2. From an atypical lesion standpoint I still feel like there are some abnormalities here and he is having quite a bit of discomfort despite the fact there is not an [name], [name] ([id number]) [id number] Page 6 of 8 open wound underlying this region. Subsequently for that reason I did discuss with the patient go going ahead and seeing about a referral to dermatology. Working to try to get in touch with them and let the patient know soon as possible were able to get in touch with Armida Sans or his nurse. 3. We will not see him any further for additional follow-up visits for now as there is no open wound there is really nothing that he needs from our end. Nonetheless I do believe that he likely will need a biopsy of this area but it would be good for Armida Sans to have a look at this prior to the biopsy for classification purposes as with the blood thinner the patient is on once we biopsy it would probably have to use silver nitrate to achieve hemostasis which is going to obscures some of the visual appearance of the lesion itself. Will see him Pratt for follow-up visit as needed. Electronic Signature(s) Signed: 08/25/2022 4:27:38 PM By: Allen Derry PA-C Entered By: Allen Derry on 08/25/2022 16:27:38 -------------------------------------------------------------------------------- ROS/PFSH Details Patient Name: Date of Service: [name], [name] ID Pratt. 08/25/2022 2:00 PM Medical Record Number: [id number] Patient Account Number: [id number] Date of Birth/Sex: Treating RN: 12-23-1945 (77 y.o. [name] Primary Care Provider: Mila Merry Other Clinician: Referring Provider: Treating Provider/Extender: Marshia Ly in Treatment: 0 Information Obtained From Patient Constitutional Symptoms  (General Health) Complaints and Symptoms: Negative for: Fatigue; Fever; Chills; Marked Weight Change Eyes Complaints and Symptoms: Negative for: Dry Eyes; Vision Changes; Glasses / Contacts Ear/Nose/Mouth/Throat Complaints and Symptoms: Negative for: Difficult clearing ears; Sinusitis Hematologic/Lymphatic Complaints and Symptoms: Negative for: Bleeding / Clotting Disorders; Human Immunodeficiency Virus Cardiovascular Complaints and Symptoms: Negative for: Chest pain; LE edema Medical History: Positive for: Peripheral Arterial Disease Gastrointestinal Complaints and Symptoms: Negative for: Frequent diarrhea; Nausea; Vomiting Endocrine Complaints and Symptoms: Negative for: Hepatitis; Thyroid disease; Polydypsia (Excessive Thirst) Medical History: Negative for: Type I Diabetes; Type II Diabetes Genitourinary Complaints and Symptoms: Negative for: Kidney failure/ Dialysis; Incontinence/dribbling Immunological Complaints and Symptoms: Negative for: Hives; Itching [name], [name] ([id number]) [id number] Page 7 of 8 Integumentary (Skin) Complaints and  Symptoms: Negative for: Wounds; Bleeding or bruising tendency; Breakdown; Swelling Musculoskeletal Complaints and Symptoms: Negative for: Muscle Pain; Muscle Weakness Neurologic Complaints and Symptoms: Negative for: Numbness/parasthesias; Focal/Weakness Psychiatric Complaints and Symptoms: Negative for: Anxiety; Claustrophobia Respiratory Medical History: Positive for: Chronic Obstructive Pulmonary Disease (COPD); Sleep Apnea Oncologic Immunizations Pneumococcal Vaccine: Received Pneumococcal Vaccination: Yes Received Pneumococcal Vaccination On or After 60th Birthday: Yes Implantable Devices None Family and Social History Former smoker; Marital Status - Married; Alcohol Use: Daily; Drug Use: No History; Caffeine Use: Moderate Electronic Signature(s) Signed: 08/25/2022 4:53:35 PM By:  Allen Derry PA-C Signed: 08/26/2022 8:21:01 AM By: Midge Aver MSN RN CNS WTA Entered By: Midge Aver on 08/25/2022 14:19:34 -------------------------------------------------------------------------------- SuperBill Details Patient Name: Date of Service: [name], [name] ID Pratt. 08/25/2022 Medical Record Number: [id number] Patient Account Number: [id number] Date of Birth/Sex: Treating RN: 1945-09-04 (77 y.o. [name] Primary Care Provider: Mila Merry Other Clinician: Referring Provider: Treating Provider/Extender: Marshia Ly in Treatment: 0 Diagnosis Coding ICD-10 Codes Code Description [phone] Other specified disorders of the skin and subcutaneous tissue L97.822 Non-pressure chronic ulcer of other part of left lower leg with fat layer exposed I73.89 Other specified peripheral vascular diseases J44.9 Chronic obstructive pulmonary disease, unspecified Facility Procedures : CPT4 Code: [id number] Description: 86578 - WOUND CARE VISIT-LEV 2 EST PT Modifier: Quantity: 1 : Harsha CPT4 Code: [id number] , [name] ([id number] ICD Description: 97597 - DEBRIDE WOUND 1ST 20 SQ CM OR < ICD-10 Diagnosis Description 934) [id number] Diagnosis Description L97.822 Non-pressure chronic ulcer of other part of left lower leg with fat layer expo Modifier: [id number] Quantity: 1 [id number] Page 8 of 8 Physician Procedures : CPT4 Code Description Modifier [id number] WC PHYS LEVEL 3 NEW PT 25 ICD-10 Diagnosis Description L98.8 Other specified disorders of the skin and subcutaneous tissue L97.822 Non-pressure chronic ulcer of other part of left lower leg with fat layer exposed  I73.89 Other specified peripheral vascular diseases J44.9 Chronic obstructive pulmonary disease, unspecified Quantity: 1 : [id number] 97597 - WC PHYS DEBR WO ANESTH 20 SQ CM ICD-10 Diagnosis Description L97.822 Non-pressure chronic ulcer of other part of left lower leg with fat layer  exposed Quantity: 1 Electronic Signature(s) Signed: 08/25/2022 4:28:13 PM By: Allen Derry PA-C Entered By: Allen Derry on 08/25/2022 16:28:13

## 2022-08-31 ENCOUNTER — Telehealth: Payer: Self-pay

## 2022-08-31 NOTE — Telephone Encounter (Signed)
-----   Message from Yosiel C Kowalski, MD sent at 08/31/2022 12:40 PM EDT ----- Diagnosis Skin , left lateral ankle ULCER WITH INFLAMED GRANULATION TISSUE, SEE DESCRIPTION  Benign inflamed granulation tissue Consistent with healing stasis ulcer (NO Evidence of cancer) Should continue to heal over time since pt had bypasss surgery on leg 

## 2022-08-31 NOTE — Telephone Encounter (Signed)
Patient advised pathology showed benign inflamed ulceration tissue, should continue to heal over time. Butch Penny., RMA

## 2022-08-31 NOTE — Telephone Encounter (Signed)
Left voicemail to return my call

## 2022-08-31 NOTE — Telephone Encounter (Signed)
-----   Message from [name], MD sent at 08/31/2022 12:40 PM EDT ----- Diagnosis Skin , left lateral ankle ULCER WITH INFLAMED GRANULATION TISSUE, SEE DESCRIPTION  Benign inflamed granulation tissue Consistent with healing stasis ulcer (NO Evidence of cancer) Should continue to heal over time since pt had bypasss surgery on leg

## 2022-09-01 ENCOUNTER — Ambulatory Visit: Payer: Medicare Other | Admitting: Physician Assistant

## 2022-09-06 ENCOUNTER — Encounter: Payer: Self-pay | Admitting: Dermatology

## 2022-09-06 ENCOUNTER — Other Ambulatory Visit: Payer: Self-pay | Admitting: Family Medicine

## 2022-09-06 DIAGNOSIS — J40 Bronchitis, not specified as acute or chronic: Secondary | ICD-10-CM

## 2022-09-07 ENCOUNTER — Encounter: Payer: Self-pay | Admitting: Dermatology

## 2022-09-10 DIAGNOSIS — R531 Weakness: Secondary | ICD-10-CM | POA: Diagnosis not present

## 2022-09-10 DIAGNOSIS — R2681 Unsteadiness on feet: Secondary | ICD-10-CM | POA: Diagnosis not present

## 2022-09-16 DIAGNOSIS — R531 Weakness: Secondary | ICD-10-CM | POA: Diagnosis not present

## 2022-09-16 DIAGNOSIS — R2681 Unsteadiness on feet: Secondary | ICD-10-CM | POA: Diagnosis not present

## 2022-09-22 DIAGNOSIS — R531 Weakness: Secondary | ICD-10-CM | POA: Diagnosis not present

## 2022-09-22 DIAGNOSIS — R2681 Unsteadiness on feet: Secondary | ICD-10-CM | POA: Diagnosis not present

## 2022-09-24 DIAGNOSIS — R2681 Unsteadiness on feet: Secondary | ICD-10-CM | POA: Diagnosis not present

## 2022-09-24 DIAGNOSIS — R531 Weakness: Secondary | ICD-10-CM | POA: Diagnosis not present

## 2022-09-29 DIAGNOSIS — R2681 Unsteadiness on feet: Secondary | ICD-10-CM | POA: Diagnosis not present

## 2022-09-29 DIAGNOSIS — R531 Weakness: Secondary | ICD-10-CM | POA: Diagnosis not present

## 2022-09-30 DIAGNOSIS — C61 Malignant neoplasm of prostate: Secondary | ICD-10-CM | POA: Diagnosis not present

## 2022-10-01 DIAGNOSIS — C61 Malignant neoplasm of prostate: Secondary | ICD-10-CM | POA: Diagnosis not present

## 2022-10-06 DIAGNOSIS — R531 Weakness: Secondary | ICD-10-CM | POA: Diagnosis not present

## 2022-10-06 DIAGNOSIS — R2681 Unsteadiness on feet: Secondary | ICD-10-CM | POA: Diagnosis not present

## 2022-10-08 ENCOUNTER — Other Ambulatory Visit: Payer: Self-pay | Admitting: Family Medicine

## 2022-10-08 DIAGNOSIS — G4700 Insomnia, unspecified: Secondary | ICD-10-CM

## 2022-10-08 DIAGNOSIS — R2681 Unsteadiness on feet: Secondary | ICD-10-CM | POA: Diagnosis not present

## 2022-10-08 DIAGNOSIS — R531 Weakness: Secondary | ICD-10-CM | POA: Diagnosis not present

## 2022-10-13 DIAGNOSIS — R531 Weakness: Secondary | ICD-10-CM | POA: Diagnosis not present

## 2022-10-13 DIAGNOSIS — R2681 Unsteadiness on feet: Secondary | ICD-10-CM | POA: Diagnosis not present

## 2022-10-15 DIAGNOSIS — R2681 Unsteadiness on feet: Secondary | ICD-10-CM | POA: Diagnosis not present

## 2022-10-15 DIAGNOSIS — R531 Weakness: Secondary | ICD-10-CM | POA: Diagnosis not present

## 2022-10-20 DIAGNOSIS — R2681 Unsteadiness on feet: Secondary | ICD-10-CM | POA: Diagnosis not present

## 2022-10-20 DIAGNOSIS — R531 Weakness: Secondary | ICD-10-CM | POA: Diagnosis not present

## 2022-10-22 DIAGNOSIS — R2681 Unsteadiness on feet: Secondary | ICD-10-CM | POA: Diagnosis not present

## 2022-10-22 DIAGNOSIS — R531 Weakness: Secondary | ICD-10-CM | POA: Diagnosis not present

## 2022-10-27 DIAGNOSIS — R2681 Unsteadiness on feet: Secondary | ICD-10-CM | POA: Diagnosis not present

## 2022-10-27 DIAGNOSIS — R531 Weakness: Secondary | ICD-10-CM | POA: Diagnosis not present

## 2022-10-29 DIAGNOSIS — R2681 Unsteadiness on feet: Secondary | ICD-10-CM | POA: Diagnosis not present

## 2022-10-29 DIAGNOSIS — R531 Weakness: Secondary | ICD-10-CM | POA: Diagnosis not present

## 2022-11-02 ENCOUNTER — Other Ambulatory Visit: Payer: Self-pay | Admitting: Family Medicine

## 2022-11-02 ENCOUNTER — Other Ambulatory Visit (INDEPENDENT_AMBULATORY_CARE_PROVIDER_SITE_OTHER): Payer: Self-pay | Admitting: Nurse Practitioner

## 2022-11-02 DIAGNOSIS — J40 Bronchitis, not specified as acute or chronic: Secondary | ICD-10-CM

## 2022-11-02 DIAGNOSIS — I739 Peripheral vascular disease, unspecified: Secondary | ICD-10-CM

## 2022-11-03 NOTE — Telephone Encounter (Signed)
Requested Prescriptions  Pending Prescriptions Disp Refills   albuterol (VENTOLIN HFA) 108 (90 Base) MCG/ACT inhaler [Pharmacy Med Name: VENTOLIN HFA 90 MCG INHALER] 18 each 0    Sig: TAKE 2 PUFFS BY MOUTH EVERY 6 HOURS AS NEEDED FOR WHEEZE OR SHORTNESS OF BREATH     Pulmonology:  Beta Agonists 2 Passed - 11/02/2022  2:24 AM      Passed - Last BP in normal range    BP Readings from Last 1 Encounters:  08/26/22 134/84         Passed - Last Heart Rate in normal range    Pulse Readings from Last 1 Encounters:  08/26/22 75         Passed - Valid encounter within last 12 months    Recent Outpatient Visits           1 year ago Umbilical hernia without obstruction and without gangrene   [facility] [name], MD   2 years ago Insomnia, unspecified type   [facility] [name], MD   2 years ago Prostate cancer [facility])   [facility] Osvaldo Angst [place], [place]   3 years ago Sleep disturbance   [facility] [name], MD   4 years ago Bronchitis   [facility] Chrismon, Jodell Cipro, PA-C       Future Appointments             In 4 months [name], MD [facility]

## 2022-11-05 ENCOUNTER — Encounter (INDEPENDENT_AMBULATORY_CARE_PROVIDER_SITE_OTHER): Payer: Medicare Other

## 2022-11-05 ENCOUNTER — Ambulatory Visit (INDEPENDENT_AMBULATORY_CARE_PROVIDER_SITE_OTHER): Payer: Medicare Other | Admitting: Vascular Surgery

## 2022-11-05 DIAGNOSIS — R531 Weakness: Secondary | ICD-10-CM | POA: Diagnosis not present

## 2022-11-05 DIAGNOSIS — R2681 Unsteadiness on feet: Secondary | ICD-10-CM | POA: Diagnosis not present

## 2022-11-09 ENCOUNTER — Ambulatory Visit (INDEPENDENT_AMBULATORY_CARE_PROVIDER_SITE_OTHER): Payer: Medicare Other

## 2022-11-09 ENCOUNTER — Ambulatory Visit (INDEPENDENT_AMBULATORY_CARE_PROVIDER_SITE_OTHER): Payer: Medicare Other | Admitting: Nurse Practitioner

## 2022-11-09 ENCOUNTER — Encounter (INDEPENDENT_AMBULATORY_CARE_PROVIDER_SITE_OTHER): Payer: Self-pay | Admitting: Nurse Practitioner

## 2022-11-09 VITALS — BP 129/74 | HR 60 | Resp 16 | Wt 225.6 lb

## 2022-11-09 DIAGNOSIS — I739 Peripheral vascular disease, unspecified: Secondary | ICD-10-CM

## 2022-11-09 DIAGNOSIS — Z9889 Other specified postprocedural states: Secondary | ICD-10-CM

## 2022-11-09 DIAGNOSIS — I70349 Atherosclerosis of unspecified type of bypass graft(s) of the left leg with ulceration of unspecified site: Secondary | ICD-10-CM | POA: Diagnosis not present

## 2022-11-09 DIAGNOSIS — R2689 Other abnormalities of gait and mobility: Secondary | ICD-10-CM | POA: Diagnosis not present

## 2022-11-09 DIAGNOSIS — E782 Mixed hyperlipidemia: Secondary | ICD-10-CM

## 2022-11-09 NOTE — Progress Notes (Signed)
Subjective:    Patient ID: [name], male    DOB: Sep 09, 1945, 77 y.o.   MRN: [id number] Chief Complaint  Patient presents with   Follow-up    Ultrasound follow up    [name] is a 77 year old male that returns to the office for followup and review of the noninvasive studies.   There have been no interval changes in lower extremity symptoms. No interval shortening of the patient's claudication distance or development of rest pain symptoms. No new ulcers or wounds have occurred since the last visit.  However, he has still has some delayed wound healing with the ulceration on his left ankle.  There have been no significant changes to the patient's overall health care.  The patient denies amaurosis fugax or recent TIA symptoms. There are no documented recent neurological changes noted. There is no history of DVT, PE or superficial thrombophlebitis. The patient denies recent episodes of angina or shortness of breath.   ABI Rt=1.21 and Lt=1.18  (previous ABI's Rt=1.09 and Lt=1.01) Duplex ultrasound of the arterial duplexes show primarily biphasic and triphasic waveforms throughout the left lower extremity with a widely patent bypass graft and stent placement.    Review of Systems  Cardiovascular:  Positive for leg swelling.  Skin:  Positive for wound.  All other systems reviewed and are negative.      Objective:   Physical Exam Vitals reviewed.  HENT:     Head: Normocephalic.  Cardiovascular:     Rate and Rhythm: Normal rate.     Pulses:          Dorsalis pedis pulses are detected w/ Doppler on the right side and detected w/ Doppler on the left side.       Posterior tibial pulses are detected w/ Doppler on the right side and detected w/ Doppler on the left side.  Pulmonary:     Effort: Pulmonary effort is normal.  Feet:     Left foot:     Skin integrity: Ulcer present.  Skin:    General: Skin is warm and dry.  Neurological:     Mental Status: He is alert and  oriented to person, place, and time.  Psychiatric:        Mood and Affect: Mood normal.        Behavior: Behavior normal.        Thought Content: Thought content normal.        Judgment: Judgment normal.     BP 129/74 (BP Location: Right Arm)   Pulse 60   Resp 16   Wt 225 lb 9.6 oz (102.3 kg)   BMI 29.76 kg/m   Past Medical History:  Diagnosis Date   Abnormal prostate specific antigen 01/23/2015   Actinic keratosis    Acute bacterial sinusitis 01/23/2015   Basal cell carcinoma 03/18/2020   right cheek, excised 06/04/20   Cannot sleep 01/23/2015   Disease caused by virus 01/23/2015   Diverticulitis 01/23/2015   Diverticulosis    History of tobacco use 07/26/2009   PNA (pneumonia) 01/23/2015   Prostate cancer (HCC)    observing   Severe obstructive sleep apnea 01/23/2015    Social History   Socioeconomic History   Marital status: Married    Spouse name: Not on file   Number of children: Not on file   Years of education: Not on file   Highest education level: Not on file  Occupational History   Not on file  Tobacco Use   Smoking  status: Former    Packs/day: 0.75    Years: 56.00    Additional pack years: 0.00    Total pack years: 42.00    Types: Cigarettes   Smokeless tobacco: Never   Tobacco comments:    Started smoking age 84, usually about 3/4 ppd Quit in 2022  Vaping Use   Vaping Use: Never used  Substance and Sexual Activity   Alcohol use: Yes    Alcohol/week: 14.0 standard drinks of alcohol    Types: 14 Glasses of wine per week   Drug use: No   Sexual activity: Not on file  Other Topics Concern   Not on file  Social History Narrative   Not on file   Social Determinants of Health   Financial Resource Strain: Patient Declined (12/15/2021)   Overall Financial Resource Strain (CARDIA)    Difficulty of Paying Living Expenses: Patient declined  Food Insecurity: No Food Insecurity (12/15/2021)   Hunger Vital Sign    Worried About Running Out of Food in  the Last Year: Never true    Ran Out of Food in the Last Year: Never true  Transportation Needs: No Transportation Needs (12/15/2021)   PRAPARE - Administrator, Civil Service (Medical): No    Lack of Transportation (Non-Medical): No  Physical Activity: Insufficiently Active (12/15/2021)   Exercise Vital Sign    Days of Exercise per Week: 2 days    Minutes of Exercise per Session: 20 min  Stress: No Stress Concern Present (12/15/2021)   [employer] of Occupational Health - Occupational Stress Questionnaire    Feeling of Stress : Not at all  Social Connections: Socially Integrated (12/15/2021)   Social Connection and Isolation Panel [NHANES]    Frequency of Communication with Friends and Family: More than three times a week    Frequency of Social Gatherings with Friends and Family: Twice a week    Attends Religious Services: More than 4 times per year    Active Member of [employer] or Organizations: Yes    Attends Engineer, structural: More than 4 times per year    Marital Status: Married  Catering manager Violence: Not At Risk (12/15/2021)   Humiliation, Afraid, Rape, and Kick questionnaire    Fear of Current or Ex-Partner: No    Emotionally Abused: No    Physically Abused: No    Sexually Abused: No    Past Surgical History:  Procedure Laterality Date   APPENDECTOMY     BACK SURGERY     CATARACT EXTRACTION Left    COLONOSCOPY     FEMORAL-TIBIAL BYPASS GRAFT Left 05/13/2022   Procedure: BYPASS GRAFT FEMORAL-TIBIAL ARTERY (POST TIBIAL);  Surgeon: Renford Dills, MD;  Location: [facility] ORS;  Service: Vascular;  Laterality: Left;   LEG SURGERY Right    meniscus repair   LOWER EXTREMITY ANGIOGRAPHY Left 03/24/2022   Procedure: Lower Extremity Angiography;  Surgeon: Renford Dills, MD;  Location: [facility] INVASIVE CV LAB;  Service: Cardiovascular;  Laterality: Left;   LOWER EXTREMITY ANGIOGRAPHY Left 07/14/2022   Procedure: Lower Extremity Angiography;  Surgeon: Renford Dills, MD;  Location: [facility] INVASIVE CV LAB;  Service: Cardiovascular;  Laterality: Left;    Family History  Problem Relation Age of Onset   Prostate cancer Father    Heart disease Father        MI in his 86s, but died at age 57    Allergies  Allergen Reactions   Penicillins Hives  Latest Ref Rng & Units 05/14/2022   12:05 AM 05/05/2022   10:09 AM 08/13/2021   11:27 AM  CBC  WBC 4.0 - 10.5 K/uL 13.9  8.6  6.9   Hemoglobin 13.0 - 17.0 g/dL 13.0  86.5  78.4   Hematocrit 39.0 - 52.0 % 39.7  46.0  46.7   Platelets 150 - 400 K/uL 177  154  175       CMP     Component Value Date/Time   NA 135 05/14/2022 0005   K 4.3 05/14/2022 0005   CL 105 05/14/2022 0005   CO2 22 05/14/2022 0005   GLUCOSE 142 (H) 05/14/2022 0005   BUN 23 07/14/2022 1234   CREATININE 0.97 07/14/2022 1234   CALCIUM 8.3 (L) 05/14/2022 0005   GFRNONAA >60 07/14/2022 1234     No results found.     Assessment & Plan:   1. 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  The patient does not voice lifestyle limiting changes at this point in time.  Noninvasive studies do not suggest clinically significant change.  No invasive studies, angiography or surgery at this time The patient should continue walking and begin a more formal exercise program.  The patient should continue antiplatelet therapy and aggressive treatment of the lipid abnormalities  No changes in the patient's medications at this time  Continued surveillance is indicated as atherosclerosis is likely to progress with time.    The patient will continue follow up with noninvasive studies as ordered.    He has a continued small ulceration of his ankle.  I have recommended he utilize Medihoney to be placed daily.  Will plan to have the patient return in 6 months and  according with his follow-up schedule however he is advised if the wound still has not improved any in the next 3 months to contact our office for evaluation. 2. Hyperlipidemia, mixed Continue statin as ordered and reviewed, no changes at this time  3. Balance problem Patient has some balance issues but he is currently working with physical therapy.  Continue therapy.   Current Outpatient Medications on File Prior to Visit  Medication Sig Dispense Refill   albuterol (VENTOLIN HFA) 108 (90 Base) MCG/ACT inhaler TAKE 2 PUFFS BY MOUTH EVERY 6 HOURS AS NEEDED FOR WHEEZE OR SHORTNESS OF BREATH 18 each 0   ALPRAZolam (XANAX) 1 MG tablet TAKE 1/2-1 TABLET BY MOUTH AT BEDTIME 30 tablet 3   ANORO ELLIPTA 62.5-25 MCG/ACT AEPB INHALE 1 PUFF BY MOUTH EVERY DAY 60 each 12   aspirin EC 81 MG tablet Take 1 tablet (81 mg total) by mouth daily. Swallow whole. 150 tablet 1   atorvastatin (LIPITOR) 20 MG tablet TAKE 1 TABLET BY MOUTH EVERY DAY IN THE EVENING 90 tablet 1   clopidogrel (PLAVIX) 75 MG tablet Take 1 tablet (75 mg total) by mouth daily. 30 tablet 6   doxycycline (VIBRAMYCIN) 50 MG capsule TAKE 1 CAPSULE BY MOUTH EVERY DAY 30 capsule 9   No current facility-administered medications on file prior to visit.    There are no Patient Instructions on file for this visit. No follow-ups on file.   [name], NP

## 2022-11-11 LAB — VAS US ABI WITH/WO TBI
Left ABI: 1.18
Right ABI: 1.21

## 2022-11-18 DIAGNOSIS — R2681 Unsteadiness on feet: Secondary | ICD-10-CM | POA: Diagnosis not present

## 2022-11-18 DIAGNOSIS — R531 Weakness: Secondary | ICD-10-CM | POA: Diagnosis not present

## 2022-12-04 ENCOUNTER — Other Ambulatory Visit (INDEPENDENT_AMBULATORY_CARE_PROVIDER_SITE_OTHER): Payer: Self-pay | Admitting: Nurse Practitioner

## 2022-12-18 DIAGNOSIS — C61 Malignant neoplasm of prostate: Secondary | ICD-10-CM | POA: Diagnosis not present

## 2022-12-22 ENCOUNTER — Ambulatory Visit (INDEPENDENT_AMBULATORY_CARE_PROVIDER_SITE_OTHER): Payer: Medicare Other

## 2022-12-22 VITALS — Ht 73.0 in | Wt 220.0 lb

## 2022-12-22 DIAGNOSIS — Z0000 Encounter for general adult medical examination without abnormal findings: Secondary | ICD-10-CM | POA: Diagnosis not present

## 2022-12-22 NOTE — Progress Notes (Signed)
Subjective:   [name] is a 77 y.o. male who presents for Medicare Annual/Subsequent preventive examination.  Visit Complete: Virtual  I connected with  [name] on 12/22/22 by a audio enabled telemedicine application and verified that I am speaking with the correct person using two identifiers.  Patient Location: Home  Provider Location: Office/Clinic  I discussed the limitations of evaluation and management by telemedicine. The patient expressed understanding and agreed to proceed.  Vital Signs: Unable to obtain new vitals due to this being a telehealth visit.  Patient Medicare AWV questionnaire was completed by the patient on 12/18/22; I have confirmed that all information answered by patient is correct and no changes since this date.  Review of Systems    Cardiac Risk Factors include: advanced age (>65men, >71 women);dyslipidemia;male gender    Objective:    Today's Vitals   12/22/22 1022  Weight: 220 lb (99.8 kg)  Height: 6\' 1"  (1.854 m)   Body mass index is 29.03 kg/m.     12/22/2022   10:28 AM 07/14/2022   12:43 PM 05/13/2022    7:19 AM 04/27/2022    9:36 AM 12/15/2021   10:11 AM  Advanced Directives  Does Patient Have a Medical Advance Directive? Yes Yes Yes Yes No  Type of Estate agent of [place];Living will Living will;Healthcare Power of [employer] Power of [place];Living will Healthcare Power of [place];Living will   Does patient want to make changes to medical advance directive?    No - Patient declined   Copy of Healthcare Power of Attorney in Chart?  No - copy requested  No - copy requested   Would patient like information on creating a medical advance directive?     No - Patient declined    Current Medications (verified) Outpatient Encounter Medications as of 12/22/2022  Medication Sig   albuterol (VENTOLIN HFA) 108 (90 Base) MCG/ACT inhaler TAKE 2 PUFFS BY MOUTH EVERY 6 HOURS AS NEEDED FOR WHEEZE OR  SHORTNESS OF BREATH   ALPRAZolam (XANAX) 1 MG tablet TAKE 1/2-1 TABLET BY MOUTH AT BEDTIME   ANORO ELLIPTA 62.5-25 MCG/ACT AEPB INHALE 1 PUFF BY MOUTH EVERY DAY   aspirin EC 81 MG tablet Take 1 tablet (81 mg total) by mouth daily. Swallow whole.   atorvastatin (LIPITOR) 20 MG tablet TAKE 1 TABLET BY MOUTH EVERY DAY IN THE EVENING   clopidogrel (PLAVIX) 75 MG tablet Take 1 tablet (75 mg total) by mouth daily.   doxycycline (VIBRAMYCIN) 50 MG capsule TAKE 1 CAPSULE BY MOUTH EVERY DAY   No facility-administered encounter medications on file as of 12/22/2022.    Allergies (verified) Penicillins   History: Past Medical History:  Diagnosis Date   Abnormal prostate specific antigen 01/23/2015   Actinic keratosis    Acute bacterial sinusitis 01/23/2015   Basal cell carcinoma 03/18/2020   right cheek, excised 06/04/20   Cannot sleep 01/23/2015   Disease caused by virus 01/23/2015   Diverticulitis 01/23/2015   Diverticulosis    History of tobacco use 07/26/2009   PNA (pneumonia) 01/23/2015   Prostate cancer (HCC)    observing   Severe obstructive sleep apnea 01/23/2015   Past Surgical History:  Procedure Laterality Date   APPENDECTOMY     BACK SURGERY     CATARACT EXTRACTION Left    COLONOSCOPY     FEMORAL-TIBIAL BYPASS GRAFT Left 05/13/2022   Procedure: BYPASS GRAFT FEMORAL-TIBIAL ARTERY (POST TIBIAL);  Surgeon: Renford Dills, MD;  Location: [facility] ORS;  Service: Vascular;  Laterality: Left;   LEG SURGERY Right    meniscus repair   LOWER EXTREMITY ANGIOGRAPHY Left 03/24/2022   Procedure: Lower Extremity Angiography;  Surgeon: Renford Dills, MD;  Location: [facility] INVASIVE CV LAB;  Service: Cardiovascular;  Laterality: Left;   LOWER EXTREMITY ANGIOGRAPHY Left 07/14/2022   Procedure: Lower Extremity Angiography;  Surgeon: Renford Dills, MD;  Location: [facility] INVASIVE CV LAB;  Service: Cardiovascular;  Laterality: Left;   Family History  Problem Relation Age of Onset    Prostate cancer Father    Heart disease Father        MI in his 44s, but died at age 5   Social History   Socioeconomic History   Marital status: Married    Spouse name: Not on file   Number of children: Not on file   Years of education: Not on file   Highest education level: Not on file  Occupational History   Not on file  Tobacco Use   Smoking status: Former    Current packs/day: 0.75    Average packs/day: 0.8 packs/day for 56.0 years (42.0 ttl pk-yrs)    Types: Cigarettes   Smokeless tobacco: Never   Tobacco comments:    Started smoking age 75, usually about 3/4 ppd Quit in 2022  Vaping Use   Vaping status: Never Used  Substance and Sexual Activity   Alcohol use: Yes    Alcohol/week: 14.0 standard drinks of alcohol    Types: 14 Glasses of wine per week   Drug use: No   Sexual activity: Not on file  Other Topics Concern   Not on file  Social History Narrative   Not on file   Social Determinants of Health   Financial Resource Strain: Low Risk  (12/18/2022)   Overall Financial Resource Strain (CARDIA)    Difficulty of Paying Living Expenses: Not hard at all  Food Insecurity: No Food Insecurity (12/18/2022)   Hunger Vital Sign    Worried About Running Out of Food in the Last Year: Never true    Ran Out of Food in the Last Year: Never true  Transportation Needs: No Transportation Needs (12/18/2022)   PRAPARE - Administrator, Civil Service (Medical): No    Lack of Transportation (Non-Medical): No  Physical Activity: Sufficiently Active (12/18/2022)   Exercise Vital Sign    Days of Exercise per Week: 5 days    Minutes of Exercise per Session: 30 min  Stress: Stress Concern Present (12/18/2022)   [employer] of Occupational Health - Occupational Stress Questionnaire    Feeling of Stress : To some extent  Social Connections: Unknown (12/18/2022)   Social Connection and Isolation Panel [NHANES]    Frequency of Communication with Friends and Family: More than  three times a week    Frequency of Social Gatherings with Friends and Family: Three times a week    Attends Religious Services: Not on file    Active Member of Clubs or Organizations: Patient declined    Attends Banker Meetings: More than 4 times per year    Marital Status: Married    Tobacco Counseling Counseling given: Not Answered Tobacco comments: Started smoking age 70, usually about 3/4 ppd Quit in 2022   Clinical Intake:  Pre-visit preparation completed: Yes  Pain : No/denies pain     BMI - recorded: 29.03 Nutritional Status: BMI 25 -29 Overweight Nutritional Risks: None Diabetes: No  How often do you need to have someone  help you when you read instructions, pamphlets, or other written materials from your doctor or pharmacy?: 1 - Never  Interpreter Needed?: No  Comments: lives with wife Information entered by :: B.,LPN   Activities of Daily Living    12/18/2022    9:03 AM 07/14/2022   12:41 PM  In your present state of health, do you have any difficulty performing the following activities:  Hearing? 1 1  Vision? 0 0  Difficulty concentrating or making decisions? 0 0  Walking or climbing stairs? 0 1  Dressing or bathing? 0 0  Doing errands, shopping? 0   Preparing Food and eating ? N   Using the Toilet? N   In the past six months, have you accidently leaked urine? N   Do you have problems with loss of bowel control? N   Managing your Medications? N   Managing your Finances? N   Housekeeping or managing your Housekeeping? N     Patient Care Team: [name], MD as PCP - General (Family Medicine) Kallie Edward, MD as Referring Physician (Urology)  Indicate any recent Medical Services you may have received from other than [facility] providers in the past year (date may be approximate).     Assessment:   This is a routine wellness examination for [place].  Hearing/Vision screen Hearing Screening - Comments:: Adequate hearing  with hearing aids Vision Screening - Comments:: Adequate vision after cataract  [facility]  Dietary issues and exercise activities discussed:     Goals Addressed             This Visit's Progress    DIET - EAT MORE FRUITS AND VEGETABLES   Not on track      Depression Screen    12/22/2022   10:27 AM 12/15/2021   10:09 AM 08/14/2020   11:11 AM 06/06/2020    2:40 PM 04/05/2018    9:26 AM 02/23/2017    3:20 PM  PHQ 2/9 Scores  PHQ - 2 Score 0 0 0 0 0 0  PHQ- 9 Score  0 4 0  6    Fall Risk    12/18/2022    9:03 AM 12/15/2021   10:12 AM 08/14/2020   11:11 AM 06/06/2020    2:41 PM 04/05/2018    9:26 AM  Fall Risk   Falls in the past year? 0 0 0 0 0  Number falls in past yr:  0 0 0   Injury with Fall?  0 0 0   Risk for fall due to : No Fall Risks No Fall Risks     Follow up Falls prevention discussed;Education provided Falls evaluation completed  Falls evaluation completed     MEDICARE RISK AT HOME:   TIMED UP AND GO:  Was the test performed?  No    Cognitive Function:        12/22/2022   10:29 AM  6CIT Screen  What Year? 0 points  What month? 0 points  What time? 0 points  Count back from 20 0 points  Months in reverse 0 points  Repeat phrase 0 points  Total Score 0 points    Immunizations Immunization History  Administered Date(s) Administered   Influenza, High Dose Seasonal PF 06/05/2016, 02/23/2017, 02/08/2020, 02/19/2021   Influenza, Seasonal, Injecte, Preservative Fre 02/18/2018   Moderna Covid-19 Vaccine Bivalent Booster 68yrs & up 02/19/2021   Moderna Sars-Covid-2 Vaccination 04/11/2020   Pneumococcal Conjugate-13 09/18/2014   Pneumococcal Polysaccharide-23 02/23/2017  TDAP status: Up to date  Flu Vaccine status: Declined, Education has been provided regarding the importance of this vaccine but patient still declined. Advised may receive this vaccine at local pharmacy or Health Dept. Aware to provide a copy of the vaccination record if  obtained from local pharmacy or Health Dept. Verbalized acceptance and understanding.  Pneumococcal vaccine status: Up to date  Covid-19 vaccine status: Completed vaccines  Qualifies for Shingles Vaccine? Yes   Zostavax completed No   Shingrix Completed?: No.    Education has been provided regarding the importance of this vaccine. Patient has been advised to call insurance company to determine out of pocket expense if they have not yet received this vaccine. Advised may also receive vaccine at local pharmacy or Health Dept. Verbalized acceptance and understanding.  Screening Tests Health Maintenance  Topic Date Due   Hepatitis C Screening  Never done   DTaP/Tdap/Td (1 - Tdap) Never done   Zoster Vaccines- Shingrix (1 of 2) Never done   Lung Cancer Screening  Never done   COVID-19 Vaccine (3 - Moderna risk series) 03/19/2021   INFLUENZA VACCINE  12/10/2022   Medicare Annual Wellness (AWV)  12/22/2023   Pneumonia Vaccine 85+ Years old  Completed   HPV VACCINES  Aged Out   Colonoscopy  Discontinued    Health Maintenance  Health Maintenance Due  Topic Date Due   Hepatitis C Screening  Never done   DTaP/Tdap/Td (1 - Tdap) Never done   Zoster Vaccines- Shingrix (1 of 2) Never done   Lung Cancer Screening  Never done   COVID-19 Vaccine (3 - Moderna risk series) 03/19/2021   INFLUENZA VACCINE  12/10/2022    Colorectal cancer screening: No longer required.   Lung Cancer Screening: (Low Dose CT Chest recommended if Age 66-80 years, 20 pack-year currently smoking OR have quit w/in 15years.) does not qualify.   Lung Cancer Screening Referral: no  Additional Screening:  Hepatitis C Screening: does not qualify; Completed yes  Vision Screening: Recommended annual ophthalmology exams for early detection of glaucoma and other disorders of the eye. Is the patient up to date with their annual eye exam?  No  Who is the provider or what is the name of the office in which the patient  attends annual eye exams? [facility] If pt is not established with a provider, would they like to be referred to a provider to establish care? No .   Dental Screening: Recommended annual dental exams for proper oral hygiene  Diabetic Foot Exam: n/a  Community Resource Referral / Chronic Care Management: CRR required this visit?  No   CCM required this visit?  No    Plan:     I have personally reviewed and noted the following in the patient's chart:   Medical and social history Use of alcohol, tobacco or illicit drugs  Current medications and supplements including opioid prescriptions. Patient is not currently taking opioid prescriptions. Functional ability and status Nutritional status Physical activity Advanced directives List of other physicians Hospitalizations, surgeries, and ER visits in previous 12 months Vitals Screenings to include cognitive, depression, and falls Referrals and appointments  In addition, I have reviewed and discussed with patient certain preventive protocols, quality metrics, and best practice recommendations. A written personalized care plan for preventive services as well as general preventive health recommendations were provided to patient.     Sue Lush, LPN   2/84/1324   After Visit Summary: (MyChart) Due to this being a telephonic  visit, the after visit summary with patients personalized plan was offered to patient via MyChart   Nurse Notes: The patient states he is doing well and has no concerns or questions at this time. He relays he is nearing the end of his PT sessions he was doing 2 times a week to strengthen his legs.

## 2022-12-22 NOTE — Patient Instructions (Signed)
[name] , Thank you for taking time to come for your Medicare Wellness Visit. I appreciate your ongoing commitment to your health goals. Please review the following plan we discussed and let me know if I can assist you in the future.   Referrals/Orders/Follow-Ups/Clinician Recommendations: none  This is a list of the screening recommended for you and due dates:  Health Maintenance  Topic Date Due   Hepatitis C Screening  Never done   DTaP/Tdap/Td vaccine (1 - Tdap) Never done   Zoster (Shingles) Vaccine (1 of 2) Never done   Screening for Lung Cancer  Never done   COVID-19 Vaccine (3 - Moderna risk series) 03/19/2021   Flu Shot  12/10/2022   Medicare Annual Wellness Visit  12/22/2023   Pneumonia Vaccine  Completed   HPV Vaccine  Aged Out   Colon Cancer Screening  Discontinued    Advanced directives: (Copy Requested) Please bring a copy of your health care power of attorney and living will to the office to be added to your chart at your convenience.  Next Medicare Annual Wellness Visit scheduled for next year: Yes 12/27/2023 @ 10:45am telephone  Preventive Care 78 Years and Older, Male  Preventive care refers to lifestyle choices and visits with your health care provider that can promote health and wellness. What does preventive care include? A yearly physical exam. This is also called an annual well check. Dental exams once or twice a year. Routine eye exams. Ask your health care provider how often you should have your eyes checked. Personal lifestyle choices, including: Daily care of your teeth and gums. Regular physical activity. Eating a healthy diet. Avoiding tobacco and drug use. Limiting alcohol use. Practicing safe sex. Taking low doses of aspirin every day. Taking vitamin and mineral supplements as recommended by your health care provider. What happens during an annual well check? The services and screenings done by your health care provider during your annual  well check will depend on your age, overall health, lifestyle risk factors, and family history of disease. Counseling  Your health care provider may ask you questions about your: Alcohol use. Tobacco use. Drug use. Emotional well-being. Home and relationship well-being. Sexual activity. Eating habits. History of falls. Memory and ability to understand (cognition). Work and work Astronomer. Screening  You may have the following tests or measurements: Height, weight, and BMI. Blood pressure. Lipid and cholesterol levels. These may be checked every 5 years, or more frequently if you are over 78 years old. Skin check. Lung cancer screening. You may have this screening every year starting at age 78 if you have a 30-pack-year history of smoking and currently smoke or have quit within the past 15 years. Fecal occult blood test (FOBT) of the stool. You may have this test every year starting at age 78. Flexible sigmoidoscopy or colonoscopy. You may have a sigmoidoscopy every 5 years or a colonoscopy every 10 years starting at age 78. Prostate cancer screening. Recommendations will vary depending on your family history and other risks. Hepatitis C blood test. Hepatitis B blood test. Sexually transmitted disease (STD) testing. Diabetes screening. This is done by checking your blood sugar (glucose) after you have not eaten for a while (fasting). You may have this done every 1-3 years. Abdominal aortic aneurysm (AAA) screening. You may need this if you are a current or former smoker. Osteoporosis. You may be screened starting at age 78 if you are at high risk. Talk with your health care provider about your  test results, treatment options, and if necessary, the need for more tests. Vaccines  Your health care provider may recommend certain vaccines, such as: Influenza vaccine. This is recommended every year. Tetanus, diphtheria, and acellular pertussis (Tdap, Td) vaccine. You may need a Td booster  every 10 years. Zoster vaccine. You may need this after age 78. Pneumococcal 13-valent conjugate (PCV13) vaccine. One dose is recommended after age 78. Pneumococcal polysaccharide (PPSV23) vaccine. One dose is recommended after age 78. Talk to your health care provider about which screenings and vaccines you need and how often you need them. This information is not intended to replace advice given to you by your health care provider. Make sure you discuss any questions you have with your health care provider. Document Released: 05/24/2015 Document Revised: 01/15/2016 Document Reviewed: 02/26/2015 Elsevier Interactive Patient Education  2017 [employer].  Fall Prevention in the Home Falls can cause injuries. They can happen to people of all ages. There are many things you can do to make your home safe and to help prevent falls. What can I do on the outside of my home? Regularly fix the edges of walkways and driveways and fix any cracks. Remove anything that might make you trip as you walk through a door, such as a raised step or threshold. Trim any bushes or trees on the path to your home. Use bright outdoor lighting. Clear any walking paths of anything that might make someone trip, such as rocks or tools. Regularly check to see if handrails are loose or broken. Make sure that both sides of any steps have handrails. Any raised decks and porches should have guardrails on the edges. Have any leaves, snow, or ice cleared regularly. Use sand or salt on walking paths during winter. Clean up any spills in your garage right away. This includes oil or grease spills. What can I do in the bathroom? Use night lights. Install grab bars by the toilet and in the tub and shower. Do not use towel bars as grab bars. Use non-skid mats or decals in the tub or shower. If you need to sit down in the shower, use a plastic, non-slip stool. Keep the floor dry. Clean up any water that spills on the floor as soon  as it happens. Remove soap buildup in the tub or shower regularly. Attach bath mats securely with double-sided non-slip rug tape. Do not have throw rugs and other things on the floor that can make you trip. What can I do in the bedroom? Use night lights. Make sure that you have a light by your bed that is easy to reach. Do not use any sheets or blankets that are too big for your bed. They should not hang down onto the floor. Have a firm chair that has side arms. You can use this for support while you get dressed. Do not have throw rugs and other things on the floor that can make you trip. What can I do in the kitchen? Clean up any spills right away. Avoid walking on wet floors. Keep items that you use a lot in easy-to-reach places. If you need to reach something above you, use a strong step stool that has a grab bar. Keep electrical cords out of the way. Do not use floor polish or wax that makes floors slippery. If you must use wax, use non-skid floor wax. Do not have throw rugs and other things on the floor that can make you trip. What can I do with my  stairs? Do not leave any items on the stairs. Make sure that there are handrails on both sides of the stairs and use them. Fix handrails that are broken or loose. Make sure that handrails are as long as the stairways. Check any carpeting to make sure that it is firmly attached to the stairs. Fix any carpet that is loose or worn. Avoid having throw rugs at the top or bottom of the stairs. If you do have throw rugs, attach them to the floor with carpet tape. Make sure that you have a light switch at the top of the stairs and the bottom of the stairs. If you do not have them, ask someone to add them for you. What else can I do to help prevent falls? Wear shoes that: Do not have high heels. Have rubber bottoms. Are comfortable and fit you well. Are closed at the toe. Do not wear sandals. If you use a stepladder: Make sure that it is fully  opened. Do not climb a closed stepladder. Make sure that both sides of the stepladder are locked into place. Ask someone to hold it for you, if possible. Clearly mark and make sure that you can see: Any grab bars or handrails. First and last steps. Where the edge of each step is. Use tools that help you move around (mobility aids) if they are needed. These include: Canes. Walkers. Scooters. Crutches. Turn on the lights when you go into a dark area. Replace any light bulbs as soon as they burn out. Set up your furniture so you have a clear path. Avoid moving your furniture around. If any of your floors are uneven, fix them. If there are any pets around you, be aware of where they are. Review your medicines with your doctor. Some medicines can make you feel dizzy. This can increase your chance of falling. Ask your doctor what other things that you can do to help prevent falls. This information is not intended to replace advice given to you by your health care provider. Make sure you discuss any questions you have with your health care provider. Document Released: 02/21/2009 Document Revised: 10/03/2015 Document Reviewed: 06/01/2014 Elsevier Interactive Patient Education  2017 [employer].

## 2022-12-25 DIAGNOSIS — C61 Malignant neoplasm of prostate: Secondary | ICD-10-CM | POA: Diagnosis not present

## 2023-01-13 DIAGNOSIS — C61 Malignant neoplasm of prostate: Secondary | ICD-10-CM | POA: Diagnosis not present

## 2023-01-13 DIAGNOSIS — R9721 Rising PSA following treatment for malignant neoplasm of prostate: Secondary | ICD-10-CM | POA: Diagnosis not present

## 2023-01-13 DIAGNOSIS — N4289 Other specified disorders of prostate: Secondary | ICD-10-CM | POA: Diagnosis not present

## 2023-01-14 DIAGNOSIS — C61 Malignant neoplasm of prostate: Secondary | ICD-10-CM | POA: Diagnosis not present

## 2023-01-19 ENCOUNTER — Telehealth (INDEPENDENT_AMBULATORY_CARE_PROVIDER_SITE_OTHER): Payer: Self-pay

## 2023-01-19 NOTE — Telephone Encounter (Signed)
Patient left a message informing that he is schedule for prostate biopsy on 9/18 with [facility] urology. The office was requesting for blood thinner clearance. I reach out to the office and left a message requesting if could fax over the clearance request.

## 2023-01-25 DIAGNOSIS — M1711 Unilateral primary osteoarthritis, right knee: Secondary | ICD-10-CM | POA: Diagnosis not present

## 2023-01-27 DIAGNOSIS — C61 Malignant neoplasm of prostate: Secondary | ICD-10-CM | POA: Diagnosis not present

## 2023-01-29 DIAGNOSIS — C61 Malignant neoplasm of prostate: Secondary | ICD-10-CM | POA: Diagnosis not present

## 2023-02-01 DIAGNOSIS — M1711 Unilateral primary osteoarthritis, right knee: Secondary | ICD-10-CM | POA: Diagnosis not present

## 2023-02-08 ENCOUNTER — Encounter (INDEPENDENT_AMBULATORY_CARE_PROVIDER_SITE_OTHER): Payer: Self-pay

## 2023-02-08 DIAGNOSIS — M1711 Unilateral primary osteoarthritis, right knee: Secondary | ICD-10-CM | POA: Diagnosis not present

## 2023-02-21 ENCOUNTER — Other Ambulatory Visit: Payer: Self-pay | Admitting: Dermatology

## 2023-02-21 ENCOUNTER — Other Ambulatory Visit: Payer: Self-pay | Admitting: Family Medicine

## 2023-02-21 DIAGNOSIS — B028 Zoster with other complications: Secondary | ICD-10-CM

## 2023-02-21 DIAGNOSIS — J40 Bronchitis, not specified as acute or chronic: Secondary | ICD-10-CM

## 2023-02-22 NOTE — Telephone Encounter (Signed)
Called pt - left message on machine to return our can schedule OV.

## 2023-02-22 NOTE — Telephone Encounter (Signed)
Requested medications are due for refill today.  yes  Requested medications are on the active medications list.  yes  Last refill. 11/03/2022 ventolin, Anoro 01/09/2022 #60 12 rf  Future visit scheduled.   np  Notes to clinic.  Pt was last seen over 1 year ago.    Requested Prescriptions  Pending Prescriptions Disp Refills   VENTOLIN HFA 108 (90 Base) MCG/ACT inhaler [Pharmacy Med Name: VENTOLIN HFA 90 MCG INHALER] 18 each 0    Sig: TAKE 2 PUFFS BY MOUTH EVERY 6 HOURS AS NEEDED FOR WHEEZE OR SHORTNESS OF BREATH     Pulmonology:  Beta Agonists 2 Passed - 02/21/2023  6:51 PM      Passed - Last BP in normal range    BP Readings from Last 1 Encounters:  11/09/22 129/74         Passed - Last Heart Rate in normal range    Pulse Readings from Last 1 Encounters:  11/09/22 60         Passed - Valid encounter within last 12 months    Recent Outpatient Visits           1 year ago Umbilical hernia without obstruction and without gangrene   [place] [facility] [name], [facility]   2 years ago Insomnia, unspecified type   [place] [facility] [name], MD   2 years ago Prostate cancer [facility])   [place] [facility] Osvaldo Angst M, [place]   4 years ago Sleep disturbance   [place] [facility] [name], MD   4 years ago Bronchitis   [place] [facility] Chrismon, Jodell Cipro, PA-C       Future Appointments             In 1 week [name], MD [facility]             [facility] ELLIPTA 62.5-25 MCG/ACT AEPB [Pharmacy Med Name: Ernestina Patches 62.5-25 MCG INH] 60 each 12    Sig: INHALE 1 PUFF BY MOUTH EVERY DAY     Pulmonology:  Comb[employer] Passed - 02/21/2023  6:51 PM      Passed - Valid encounter within last 12 months    Recent Outpatient Visits           1 year ago Umbilical hernia without obstruction and without gangrene   [place]  [facility] [name], MD   2 years ago Insomnia, unspecified type   [facility] [name], MD   2 years ago Prostate cancer [facility])   [facility] [place], Ricki Rodriguez [place], [place]   4 years ago Sleep disturbance   [facility] [name], [place]   4 years ago Bronchitis   [facility] Chrismon, Jodell Cipro, PA-C       Future Appointments             In 1 week [name], MD [facility]

## 2023-02-27 ENCOUNTER — Other Ambulatory Visit (INDEPENDENT_AMBULATORY_CARE_PROVIDER_SITE_OTHER): Payer: Self-pay | Admitting: Nurse Practitioner

## 2023-03-04 ENCOUNTER — Ambulatory Visit: Payer: Medicare Other | Admitting: Dermatology

## 2023-03-16 ENCOUNTER — Other Ambulatory Visit: Payer: Self-pay | Admitting: Family Medicine

## 2023-03-16 DIAGNOSIS — J40 Bronchitis, not specified as acute or chronic: Secondary | ICD-10-CM

## 2023-03-17 NOTE — Telephone Encounter (Signed)
Requested Prescriptions  Pending Prescriptions Disp Refills   albuterol (VENTOLIN HFA) 108 (90 Base) MCG/ACT inhaler [Pharmacy Med Name: VENTOLIN HFA 90 MCG INHALER] 18 each 0    Sig: INHALE 2 PUFFS BY MOUTH EVERY 6 HOURS AS NEEDED FOR WHEEZE OR SHORTNESS OF BREATH     Pulmonology:  Beta Agonists 2 Passed - 03/16/2023  2:27 AM      Passed - Last BP in normal range    BP Readings from Last 1 Encounters:  11/09/22 129/74         Passed - Last Heart Rate in normal range    Pulse Readings from Last 1 Encounters:  11/09/22 60         Passed - Valid encounter within last 12 months    Recent Outpatient Visits           1 year ago Umbilical hernia without obstruction and without gangrene   [place] [facility] [name], MD   2 years ago Insomnia, unspecified type   [name], [facility]   2 years ago Prostate cancer [facility])   [facility] [place], Ricki Rodriguez [place], [place]   4 years ago Sleep disturbance   [facility] [name], [place]   4 years ago Bronchitis   [facility] Chrismon, Jodell Cipro, PA-C       Future Appointments             In 3 months [name], MD [facility]

## 2023-03-20 DIAGNOSIS — R319 Hematuria, unspecified: Secondary | ICD-10-CM | POA: Diagnosis not present

## 2023-03-20 DIAGNOSIS — Z8546 Personal history of malignant neoplasm of prostate: Secondary | ICD-10-CM | POA: Diagnosis not present

## 2023-03-20 DIAGNOSIS — G4733 Obstructive sleep apnea (adult) (pediatric): Secondary | ICD-10-CM | POA: Diagnosis not present

## 2023-03-20 DIAGNOSIS — R339 Retention of urine, unspecified: Secondary | ICD-10-CM | POA: Diagnosis not present

## 2023-03-20 DIAGNOSIS — Z880 Allergy status to penicillin: Secondary | ICD-10-CM | POA: Diagnosis not present

## 2023-03-20 DIAGNOSIS — R198 Other specified symptoms and signs involving the digestive system and abdomen: Secondary | ICD-10-CM | POA: Diagnosis not present

## 2023-03-20 DIAGNOSIS — R338 Other retention of urine: Secondary | ICD-10-CM | POA: Diagnosis not present

## 2023-03-20 DIAGNOSIS — K5900 Constipation, unspecified: Secondary | ICD-10-CM | POA: Diagnosis not present

## 2023-03-20 DIAGNOSIS — F17210 Nicotine dependence, cigarettes, uncomplicated: Secondary | ICD-10-CM | POA: Diagnosis not present

## 2023-03-21 DIAGNOSIS — K5900 Constipation, unspecified: Secondary | ICD-10-CM | POA: Diagnosis not present

## 2023-03-24 ENCOUNTER — Emergency Department
Admission: EM | Admit: 2023-03-24 | Discharge: 2023-03-24 | Disposition: A | Discharge status: Home / Self Care | Payer: Medicare Other | Attending: Emergency Medicine | Admitting: Emergency Medicine

## 2023-03-24 ENCOUNTER — Other Ambulatory Visit: Payer: Self-pay

## 2023-03-24 DIAGNOSIS — T83091A Other mechanical complication of indwelling urethral catheter, initial encounter: Secondary | ICD-10-CM | POA: Diagnosis not present

## 2023-03-24 DIAGNOSIS — R8271 Bacteriuria: Secondary | ICD-10-CM | POA: Insufficient documentation

## 2023-03-24 DIAGNOSIS — R339 Retention of urine, unspecified: Secondary | ICD-10-CM | POA: Diagnosis not present

## 2023-03-24 DIAGNOSIS — R319 Hematuria, unspecified: Secondary | ICD-10-CM | POA: Diagnosis not present

## 2023-03-24 DIAGNOSIS — I10 Essential (primary) hypertension: Secondary | ICD-10-CM | POA: Diagnosis not present

## 2023-03-24 LAB — CBC
HCT: 46.5 % (ref 39.0–52.0)
Hemoglobin: 15.6 g/dL (ref 13.0–17.0)
MCH: 32.2 pg (ref 26.0–34.0)
MCHC: 33.5 g/dL (ref 30.0–36.0)
MCV: 96.1 fL (ref 80.0–100.0)
Platelets: 194 10*3/uL (ref 150–400)
RBC: 4.84 MIL/uL (ref 4.22–5.81)
RDW: 12.5 % (ref 11.5–15.5)
WBC: 11.3 10*3/uL — ABNORMAL HIGH (ref 4.0–10.5)
nRBC: 0 % (ref 0.0–0.2)

## 2023-03-24 LAB — COMPREHENSIVE METABOLIC PANEL
ALT: 29 U/L (ref 0–44)
AST: 19 U/L (ref 15–41)
Albumin: 4.4 g/dL (ref 3.5–5.0)
Alkaline Phosphatase: 83 U/L (ref 38–126)
Anion gap: 10 (ref 5–15)
BUN: 21 mg/dL (ref 8–23)
CO2: 23 mmol/L (ref 22–32)
Calcium: 9.1 mg/dL (ref 8.9–10.3)
Chloride: 105 mmol/L (ref 98–111)
Creatinine, Ser: 0.82 mg/dL (ref 0.61–1.24)
GFR, Estimated: >60 mL/min (ref >60)
Glucose, Bld: 109 mg/dL — ABNORMAL HIGH (ref 70–99)
Potassium: 3.7 mmol/L (ref 3.5–5.1)
Sodium: 138 mmol/L (ref 135–145)
Total Bilirubin: 0.6 mg/dL (ref <1.2)
Total Protein: 8 g/dL (ref 6.5–8.1)

## 2023-03-24 LAB — URINALYSIS, ROUTINE W REFLEX MICROSCOPIC
RBC / HPF: >50 RBC/hpf (ref 0–5)
Squamous Epithelial / HPF: 0 /[HPF] (ref 0–5)
WBC, UA: >50 WBC/hpf (ref 0–5)

## 2023-03-24 LAB — LIPASE, BLOOD: Lipase: 32 U/L (ref 11–51)

## 2023-03-24 MED ORDER — CIPROFLOXACIN HCL 500 MG PO TABS
500.0000 mg | ORAL_TABLET | Freq: Once | ORAL | Status: AC
  Administered 2023-03-24: 500 mg via ORAL
  Filled 2023-03-24: qty 1

## 2023-03-24 MED ORDER — CIPROFLOXACIN HCL 500 MG PO TABS: 500.0000 mg | ORAL_TABLET | Freq: Two times a day (BID) | ORAL | 0 refills | Status: AC

## 2023-03-24 NOTE — Discharge Instructions (Addendum)
Take antibiotic for the full course as prescribed and follow-up with your urologist next week as scheduled.  Thank you for choosing [place] for your health care today!  Please see your primary doctor this week for a follow up appointment.   If you have any new, worsening, or unexpected symptoms call your doctor right away or come back to the emergency department for reevaluation.  It was my pleasure to care for you today.   Daneil Dan Modesto Charon, MD

## 2023-03-24 NOTE — ED Triage Notes (Addendum)
Pt to ED via POV c/o LLQ abd pain and constipastion. Pt got catheter inserted on Saturday. Pt has been having blood in urine and pain since then. Pts pain worsened today. Pt also endorses constipation. Denies CP, SOB, fevers, dizziness in urinary bag

## 2023-03-24 NOTE — ED Notes (Signed)
Bladder scan 

## 2023-03-24 NOTE — ED Provider Notes (Signed)
[facility] Provider Note    Event Date/Time   First MD Initiated Contact with Patient 03/24/23 2302     (approximate)   History   Abdominal Pain and Hematuria   HPI  [name] is a 77 y.o. male   Past medical history of recent Foley catheter placement for urinary retention at [facility] who presents to the emergency department with suprapubic discomfort/hematuria and ongoing constipation.  He has follow-up with urologist next week.  He was noted by nurse to have nearly 500 cc on bladder scan so a new Foley was placed with significant output, complete relief of symptoms.  Independent Historian contributed to assessment above: His son is at bedside to corroborate information given above  External Medical Documents Reviewed: [facility] emergency department visit from within this last week for urinary retention Foley placement      Physical Exam   Triage Vital Signs: ED Triage Vitals  Encounter Vitals Group     BP 03/24/23 1918 (!) 160/80     Systolic BP Percentile --      Diastolic BP Percentile --      Pulse Rate 03/24/23 1918 82     Resp 03/24/23 1918 16     Temp 03/24/23 1918 97.7 F (36.5 C)     Temp src --      SpO2 03/24/23 1918 93 %     Weight 03/24/23 1920 223 lb (101.2 kg)     Height 03/24/23 1920 6\' 1"  (1.854 m)     Head Circumference --      Peak Flow --      Pain Score 03/24/23 1919 7     Pain Loc --      Pain Education --      Exclude from Growth Chart --     Most recent vital signs: Vitals:   03/24/23 1918  BP: (!) 160/80  Pulse: 82  Resp: 16  Temp: 97.7 F (36.5 C)  SpO2: 93%    General: Awake, no distress.  CV:  Good peripheral perfusion.  Resp:  Normal effort.  Abd:  No distention.  Other:  Red urine in leg bag, soft benign abdominal exam.   ED Results / Procedures / Treatments   Labs (all labs ordered are listed, but only abnormal results are displayed) Labs Reviewed  URINALYSIS, ROUTINE W REFLEX  MICROSCOPIC - Abnormal; Notable for the following components:      Result Value   Color, Urine RED (*)    APPearance TURBID (*)    Glucose, UA   (*)    Value: TEST NOT REPORTED DUE TO COLOR INTERFERENCE OF URINE PIGMENT   Hgb urine dipstick   (*)    Value: TEST NOT REPORTED DUE TO COLOR INTERFERENCE OF URINE PIGMENT   Bilirubin Urine   (*)    Value: TEST NOT REPORTED DUE TO COLOR INTERFERENCE OF URINE PIGMENT   Ketones, ur   (*)    Value: TEST NOT REPORTED DUE TO COLOR INTERFERENCE OF URINE PIGMENT   Protein, ur   (*)    Value: TEST NOT REPORTED DUE TO COLOR INTERFERENCE OF URINE PIGMENT   Nitrite   (*)    Value: TEST NOT REPORTED DUE TO COLOR INTERFERENCE OF URINE PIGMENT   Leukocytes,Ua   (*)    Value: TEST NOT REPORTED DUE TO COLOR INTERFERENCE OF URINE PIGMENT   Bacteria, UA MANY (*)    All other components within normal limits  CBC - Abnormal;  Notable for the following components:   WBC 11.3 (*)    All other components within normal limits  COMPREHENSIVE METABOLIC PANEL - Abnormal; Notable for the following components:   Glucose, Bld 109 (*)    All other components within normal limits  URINE CULTURE  LIPASE, BLOOD     I ordered and reviewed the above labs they are notable for many bacteria in the urine.  White blood cell count is 11.3.   PROCEDURES:  Critical Care performed: No  Procedures   MEDICATIONS ORDERED IN ED: Medications  ciprofloxacin (CIPRO) tablet 500 mg (500 mg Oral Given 03/24/23 2335)    IMPRESSION / MDM / ASSESSMENT AND PLAN / ED COURSE  I reviewed the triage vital signs and the nursing notes.                                Patient's presentation is most consistent with acute presentation with potential threat to life or bodily function.  Differential diagnosis includes, but is not limited to, urinary retention, urinary tract infection, Foley catheter obstruction   The patient is on the cardiac monitor to evaluate for evidence of  arrhythmia and/or significant heart rate changes.  MDM:    Hematuria and Foley catheter obstruction after recent placement.  Bacteria in the urine, will treat for urinary tract infection especially in light of leukocytosis.  Foley catheter was replaced and is now draining clear red fluid, with immediate relief of suprapubic discomfort.  Ongoing constipation with negative obstruction on CT imaging a couple of days ago, will defer advanced imaging at this time.  He will follow-up with urologist.       FINAL CLINICAL IMPRESSION(S) / ED DIAGNOSES   Final diagnoses:  Urinary retention  Obstructed Foley catheter, initial encounter (HCC)  Hematuria, unspecified type  Bacteriuria     Rx / DC Orders   ED Discharge Orders          Ordered    ciprofloxacin (CIPRO) 500 MG tablet  2 times daily        03/24/23 2331             Note:  This document was prepared using Dragon voice recognition software and may include unintentional dictation errors.    [name], MD 03/25/23 [phone]

## 2023-03-26 ENCOUNTER — Other Ambulatory Visit: Payer: Self-pay

## 2023-03-26 ENCOUNTER — Emergency Department
Admission: EM | Admit: 2023-03-26 | Discharge: 2023-03-26 | Disposition: A | Discharge status: Home / Self Care | Payer: Medicare Other | Attending: Emergency Medicine | Admitting: Emergency Medicine

## 2023-03-26 ENCOUNTER — Telehealth: Payer: Self-pay

## 2023-03-26 DIAGNOSIS — Z8546 Personal history of malignant neoplasm of prostate: Secondary | ICD-10-CM | POA: Insufficient documentation

## 2023-03-26 DIAGNOSIS — T839XXA Unspecified complication of genitourinary prosthetic device, implant and graft, initial encounter: Secondary | ICD-10-CM

## 2023-03-26 DIAGNOSIS — T83091A Other mechanical complication of indwelling urethral catheter, initial encounter: Secondary | ICD-10-CM | POA: Diagnosis not present

## 2023-03-26 LAB — URINE CULTURE: Culture: NO GROWTH

## 2023-03-26 NOTE — ED Provider Notes (Signed)
[facility] Provider Note  Patient Contact: 3:42 PM (approximate)   History   urinary catheter problem   HPI  [name] is a 77 y.o. male who presents to the emergency department for possible urinary catheter complication.  Patient has an indwelling urinary catheter since last week.  He had obstruction to the catheter 2 days ago and had to have it replaced.  Patient has a history of prostate cancer.  He is followed by urology.  Patient has had significant amount of hematuria since Foley catheter placement.  No sharp abdominal or flank pain.     Physical Exam   Triage Vital Signs: ED Triage Vitals  Encounter Vitals Group     BP 03/26/23 1424 (!) 147/66     Systolic BP Percentile --      Diastolic BP Percentile --      Pulse Rate 03/26/23 1424 82     Resp 03/26/23 1424 18     Temp 03/26/23 1423 98.3 F (36.8 C)     Temp src --      SpO2 03/26/23 1424 95 %     Weight 03/26/23 1425 222 lb 10.6 oz (101 kg)     Height 03/26/23 1425 6\' 1"  (1.854 m)     Head Circumference --      Peak Flow --      Pain Score 03/26/23 1425 5     Pain Loc --      Pain Education --      Exclude from Growth Chart --     Most recent vital signs: Vitals:   03/26/23 1423 03/26/23 1424  BP:  (!) 147/66  Pulse:  82  Resp:  18  Temp: 98.3 F (36.8 C)   SpO2:  95%     General: Alert and in no acute distress.  Cardiovascular:  Good peripheral perfusion Respiratory: Normal respiratory effort without tachypnea or retractions. Lungs CTAB.  Gastrointestinal: Bowel sounds 4 quadrants. Soft and nontender to palpation. No guarding or rigidity. No palpable masses. No distention. No CVA tenderness. Musculoskeletal: Full range of motion to all extremities.  Neurologic:  No gross focal neurologic deficits are appreciated.  Skin:   No rash noted Other:   ED Results / Procedures / Treatments   Labs (all labs ordered are listed, but only abnormal results are  displayed) Labs Reviewed - No data to display   EKG     RADIOLOGY    No results found.  PROCEDURES:  Critical Care performed: No  Procedures   MEDICATIONS ORDERED IN ED: Medications - No data to display   IMPRESSION / MDM / ASSESSMENT AND PLAN / ED COURSE  I reviewed the triage vital signs and the nursing notes.                                 Differential diagnosis includes, but is not limited to, urinary catheter complication, UTI, BPH, prostate cancer.  Patient's presentation is most consistent with acute presentation with potential threat to life or bodily function.   Patient's diagnosis is consistent with urinary catheter complication.  Patient presents with indwelling urinary catheter without urinary output.  Patient revealed over 200 mL of urine on bladder scan.  Foley catheter was flushed, my suspicion is that patient had a blood clot in the Foley catheter.  After flushing patient had 450 mL of output.  Patient immediately started outputting urine.  At  this time patient is stable for discharge.  Follow-up with urology. Patient is given ED precautions to return to the ED for any worsening or new symptoms.     FINAL CLINICAL IMPRESSION(S) / ED DIAGNOSES   Final diagnoses:  Urinary catheter complication, initial encounter (HCC)     Rx / DC Orders   ED Discharge Orders     None        Note:  This document was prepared using Dragon voice recognition software and may include unintentional dictation errors.   Racheal Patches, PA-C 03/26/23 1737    Minna Antis, MD 03/26/23 1818

## 2023-03-26 NOTE — ED Provider Triage Note (Signed)
Emergency Medicine Provider Triage Evaluation Note  [name] , a 77 y.o. male  was evaluated in triage.  Pt complains of catheter problem. Catheter was placed about a week ago for urinary retention. Was concerned it was clogged so came the ED a few days ago, had it replaced. Today feels the same but is not in as much pain as he was before. Reports when he last urinated he peed around the catheter.   Review of Systems  Positive: Abdominal pain, hematuria, constipation, localized irritation Negative:   Physical Exam  BP (!) 147/66   Pulse 82   Temp 98.3 F (36.8 C)   Resp 18   SpO2 95%  Gen:   Awake, no distress   Resp:  Normal effort  MSK:   Moves extremities without difficulty  Other:    Medical Decision Making  Medically screening exam initiated at 2:25 PM.  Appropriate orders placed.  [name] was informed that the remainder of the evaluation will be completed by another provider, this initial triage assessment does not replace that evaluation, and the importance of remaining in the ED until their evaluation is complete.     Christopher Ali, PA-C 03/26/23 1430

## 2023-03-26 NOTE — ED Triage Notes (Signed)
Pt to ED for possible clogged urinary catheter. Reports was seen a couple days ago for same and had catheter replaced. Reports urinated a few hours ago and urinated on self.

## 2023-03-26 NOTE — ED Notes (Signed)
This RN to bedside to answer call bell. Pt advised he was told to notify someone once there was fresh urine in his leg bag. Pt has about 15ml of fresh urine in bag.

## 2023-03-26 NOTE — Transitions of Care (Post Inpatient/ED Visit) (Signed)
   03/26/2023  Name: [name] MRN: [id number] DOB: 07-28-1945  Today's TOC FU Call Status: Today's TOC FU Call Status:: Successful TOC FU Call Completed TOC FU Call Complete Date: 03/26/23 Patient's Name and Date of Birth confirmed.  Transition Care Management Follow-up Telephone Call Date of Discharge: 03/24/23 Discharge Facility: [facility]) Type of Discharge: Emergency Department Reason for ED Visit: Other: (urine retention) How have you been since you were released from the hospital?: Better Any questions or concerns?: No  Items Reviewed: Did you receive and understand the discharge instructions provided?: Yes Medications obtained,verified, and reconciled?: Yes (Medications Reviewed) Any new allergies since your discharge?: No Dietary orders reviewed?: Yes Do you have support at home?: Yes People in Home: spouse  Medications Reviewed Today: Medications Reviewed Today     Reviewed by [name], LPN (Licensed Practical Nurse) on 03/26/23 at 1043  Med List Status: <None>   Medication Order Taking? Sig Documenting Provider Last Dose Status Informant  albuterol (VENTOLIN HFA) 108 (90 Base) MCG/ACT inhaler [id number]  INHALE 2 PUFFS BY MOUTH EVERY 6 HOURS AS NEEDED FOR WHEEZE OR SHORTNESS OF [name], MD  Active   ALPRAZolam [name]) 1 MG tablet [id number] No TAKE 1/2-1 TABLET BY MOUTH AT BEDTIME [name], MD Taking Active   [facility] ELLIPTA 62.5-25 MCG/ACT AEPB [id number]  INHALE 1 PUFF BY MOUTH EVERY DAY [name], MD  Active   aspirin EC 81 MG tablet [id number] No Take 1 tablet (81 mg total) by mouth daily. Swallow whole. [name], [name], MD Taking Active   atorvastatin (LIPITOR) 20 MG tablet [id number] No TAKE 1 TABLET BY MOUTH EVERY DAY IN THE [name], NP Taking Active   ciprofloxacin (CIPRO) 500 MG tablet [id number]  Take 1 tablet (500 mg total) by mouth 2 (two) times daily for 7 days. [name], MD  Active   clopidogrel (PLAVIX) 75 MG tablet [id number]  TAKE 1 TABLET BY MOUTH EVERY DAY [name], NP  Active   doxycycline (VIBRAMYCIN) 50 MG capsule [id number] No TAKE 1 CAPSULE BY MOUTH EVERY DAY [name], MD Taking Active             Home Care and Equipment/Supplies: Were Home Health Services Ordered?: NA Any new equipment or medical supplies ordered?: NA  Functional Questionnaire: Do you need assistance with bathing/showering or dressing?: No Do you need assistance with meal preparation?: No Do you need assistance with eating?: No Do you have difficulty maintaining continence: No Do you need assistance with getting out of bed/getting out of a chair/moving?: No Do you have difficulty managing or taking your medications?: No  Follow up appointments reviewed: PCP Follow-up appointment confirmed?: NA Specialist Hospital Follow-up appointment confirmed?: No Reason Specialist Follow-Up Not Confirmed: Patient has Specialist Provider Number and will Call for Appointment Do you need transportation to your follow-up appointment?: No Do you understand care options if your condition(s) worsen?: Yes-patient verbalized understanding    SIGNATURE [name], LPN [facility] Nurse Health Advisor Direct Dial [phone]

## 2023-03-26 NOTE — ED Notes (Signed)
Patient left without written discharge instructions. Patient was given verbal discharge instructions by Gala Romney PA-C. Patient is waiting in lobby for ride home.

## 2023-03-26 NOTE — ED Notes (Signed)
New leg bag placed.

## 2023-03-29 ENCOUNTER — Other Ambulatory Visit: Payer: Self-pay

## 2023-03-29 ENCOUNTER — Emergency Department
Admission: EM | Admit: 2023-03-29 | Discharge: 2023-03-29 | Disposition: A | Discharge status: Home / Self Care | Payer: Medicare Other | Attending: Emergency Medicine | Admitting: Emergency Medicine

## 2023-03-29 ENCOUNTER — Encounter: Payer: Self-pay | Admitting: Emergency Medicine

## 2023-03-29 DIAGNOSIS — Z85828 Personal history of other malignant neoplasm of skin: Secondary | ICD-10-CM | POA: Insufficient documentation

## 2023-03-29 DIAGNOSIS — T83091A Other mechanical complication of indwelling urethral catheter, initial encounter: Secondary | ICD-10-CM | POA: Insufficient documentation

## 2023-03-29 DIAGNOSIS — T83098A Other mechanical complication of other indwelling urethral catheter, initial encounter: Secondary | ICD-10-CM | POA: Insufficient documentation

## 2023-03-29 DIAGNOSIS — N390 Urinary tract infection, site not specified: Secondary | ICD-10-CM | POA: Insufficient documentation

## 2023-03-29 DIAGNOSIS — R310 Gross hematuria: Secondary | ICD-10-CM | POA: Insufficient documentation

## 2023-03-29 DIAGNOSIS — Z7982 Long term (current) use of aspirin: Secondary | ICD-10-CM | POA: Insufficient documentation

## 2023-03-29 DIAGNOSIS — Z7902 Long term (current) use of antithrombotics/antiplatelets: Secondary | ICD-10-CM | POA: Insufficient documentation

## 2023-03-29 DIAGNOSIS — Z720 Tobacco use: Secondary | ICD-10-CM | POA: Diagnosis not present

## 2023-03-29 DIAGNOSIS — Z8546 Personal history of malignant neoplasm of prostate: Secondary | ICD-10-CM | POA: Insufficient documentation

## 2023-03-29 DIAGNOSIS — Y732 Prosthetic and other implants, materials and accessory gastroenterology and urology devices associated with adverse incidents: Secondary | ICD-10-CM | POA: Insufficient documentation

## 2023-03-29 NOTE — ED Notes (Signed)
Pt urinary catheter irrigated and approx returned. Pt reports some relief.

## 2023-03-29 NOTE — ED Provider Notes (Signed)
[facility] Provider Note    Event Date/Time   First MD Initiated Contact with Patient 03/29/23 [phone]     (approximate)   History   Urinary Retention   HPI  [name] is a 77 y.o. male who returns to the ED from home with ongoing Foley catheter issues.  Patient takes Plavix, has been having hematuria requiring ER visits for Foley catheter obstruction.  Reports clear urine yesterday after having it flushed in the ED on Friday then hematuria again and has not had any output via Foley catheter since 5 PM.  Denies fever/chills, chest pain, shortness of breath, nausea, vomiting or dizziness.     Past Medical History   Past Medical History:  Diagnosis Date   Abnormal prostate specific antigen 01/23/2015   Actinic keratosis    Acute bacterial sinusitis 01/23/2015   Basal cell carcinoma 03/18/2020   right cheek, excised 06/04/20   Cannot sleep 01/23/2015   Disease caused by virus 01/23/2015   Diverticulitis 01/23/2015   Diverticulosis    History of tobacco use 07/26/2009   PNA (pneumonia) 01/23/2015   Prostate cancer (HCC)    observing   Severe obstructive sleep apnea 01/23/2015     Active Problem List   Patient Active Problem List   Diagnosis Date Noted   Atherosclerosis of artery of extremity with ulceration (HCC) 05/13/2022   Atherosclerosis of native arteries of the extremities with ulceration (HCC) 02/22/2022   Osteoarthritis of right knee 11/17/2021   Plantar fasciitis of left foot 11/17/2021   Chronic obstructive pulmonary disease (HCC) 08/16/2021   Benign prostatic hyperplasia with urinary frequency 08/05/2020   OSA (obstructive sleep apnea) 12/30/2018   SOBOE (shortness of breath on exertion) 10/18/2017   Abnormal ECG 10/13/2017   Hyperlipidemia, mixed 10/13/2017   Lumbar radiculopathy 10/08/2017   Lumbosacral radiculitis 02/24/2017   Leg pain 02/24/2017   Cervical radiculitis 11/29/2015   Sprain of metacarpophalangeal joint  07/01/2015   Insomnia 01/23/2015   Prostate cancer (HCC) 01/23/2015   History of tobacco use 07/26/2009     Past Surgical History   Past Surgical History:  Procedure Laterality Date   APPENDECTOMY     BACK SURGERY     CATARACT EXTRACTION Left    COLONOSCOPY     FEMORAL-TIBIAL BYPASS GRAFT Left 05/13/2022   Procedure: BYPASS GRAFT FEMORAL-TIBIAL ARTERY (POST TIBIAL);  Surgeon: Renford Dills, MD;  Location: [facility] ORS;  Service: Vascular;  Laterality: Left;   LEG SURGERY Right    meniscus repair   LOWER EXTREMITY ANGIOGRAPHY Left 03/24/2022   Procedure: Lower Extremity Angiography;  Surgeon: Renford Dills, MD;  Location: [facility] INVASIVE CV LAB;  Service: Cardiovascular;  Laterality: Left;   LOWER EXTREMITY ANGIOGRAPHY Left 07/14/2022   Procedure: Lower Extremity Angiography;  Surgeon: Renford Dills, MD;  Location: [facility] INVASIVE CV LAB;  Service: Cardiovascular;  Laterality: Left;     Home Medications   Prior to Admission medications   Medication Sig Start Date End Date Taking? Authorizing Provider  albuterol (VENTOLIN HFA) 108 (90 Base) MCG/ACT inhaler INHALE 2 PUFFS BY MOUTH EVERY 6 HOURS AS NEEDED FOR WHEEZE OR SHORTNESS OF BREATH 03/17/23   [name], MD  ALPRAZolam [name]) 1 MG tablet TAKE 1/2-1 TABLET BY MOUTH AT BEDTIME 10/09/22   [name], MD  [facility] ELLIPTA 62.5-25 MCG/ACT AEPB INHALE 1 PUFF BY MOUTH EVERY DAY 02/23/23   [name], MD  aspirin EC 81 MG tablet Take 1 tablet (81 mg  total) by mouth daily. Swallow whole. 07/14/22 07/14/23  [name], [name], MD  atorvastatin (LIPITOR) 20 MG tablet TAKE 1 TABLET BY MOUTH EVERY DAY IN THE EVENING 12/04/22   [name], NP  ciprofloxacin (CIPRO) 500 MG tablet Take 1 tablet (500 mg total) by mouth 2 (two) times daily for 7 days. 03/24/23 03/31/23  [name], MD  clopidogrel (PLAVIX) 75 MG tablet TAKE 1 TABLET BY MOUTH EVERY DAY 03/01/23   [name], NP  doxycycline (VIBRAMYCIN) 50 MG capsule TAKE  1 CAPSULE BY MOUTH EVERY DAY 06/23/22   [name], MD     Allergies  Penicillins   Family History   Family History  Problem Relation Age of Onset   Prostate cancer Father    Heart disease Father        MI in his 59s, but died at age 84     Physical Exam  Triage Vital Signs: ED Triage Vitals  Encounter Vitals Group     BP 03/29/23 0145 138/85     Systolic BP Percentile --      Diastolic BP Percentile --      Pulse Rate 03/29/23 0145 83     Resp 03/29/23 0145 18     Temp 03/29/23 0145 97.8 F (36.6 C)     Temp Source 03/29/23 0145 Oral     SpO2 03/29/23 0145 92 %     Weight --      Height --      Head Circumference --      Peak Flow --      Pain Score 03/29/23 0146 7     Pain Loc --      Pain Education --      Exclude from Growth Chart --     Updated Vital Signs: BP 138/65   Pulse 79   Temp 97.8 F (36.6 C) (Oral)   Resp 18   SpO2 94%    General: Awake, no distress.  CV:  RRR.  Good peripheral perfusion.  Resp:  Normal effort.  CTAB. Abd:  Nontender.  No distention.  Other:  Foley catheter draining bloody urine.   ED Results / Procedures / Treatments  Labs (all labs ordered are listed, but only abnormal results are displayed) Labs Reviewed - No data to display   EKG  None   RADIOLOGY None   Official radiology report(s): No results found.   PROCEDURES:  Critical Care performed: No  Procedures   MEDICATIONS ORDERED IN ED: Medications - No data to display   IMPRESSION / MDM / ASSESSMENT AND PLAN / ED COURSE  I reviewed the triage vital signs and the nursing notes.                             77 year old male presenting with obstructed Foley catheter.  Catheter was irrigated in triage.  Patient voiced interest in learning how to irrigate it himself at home.  He was instructed by nursing staff and supplies provided to patient as well as large Foley bag for overnight use.  He has an appointment with his urologist this week.   Strict return precautions given.  Patient verbalizes understanding and agrees with plan of care.  Patient's presentation is most consistent with acute complicated illness / injury requiring diagnostic workup.   FINAL CLINICAL IMPRESSION(S) / ED DIAGNOSES   Final diagnoses:  Obstruction of Foley catheter, initial encounter (HCC)  Rx / DC Orders   ED Discharge Orders     None        Note:  This document was prepared using Dragon voice recognition software and may include unintentional dictation errors.   Irean Hong, MD 03/29/23 [phone]

## 2023-03-29 NOTE — ED Triage Notes (Signed)
Patient ambulatory to triage with complaints of his urinary catheter not draining. States he was seen last night for same and sent with syringe and water to flush it if this occurred again, states he's flushed it 3x today without drainage. Does endorse some leakage around the urinary catheter but none to obtain relief.

## 2023-03-29 NOTE — ED Triage Notes (Signed)
Pt reports ongoing issue with urinary catheter being blocked. Pt reports clear urine yesterday after having it flushed friday and then developed some blood in urine and not hasn't had any output since 5pm yesterday.

## 2023-03-29 NOTE — ED Notes (Signed)
Educated Pt on foley care and how to irrigate foley at home, given supplies and educated when to return to ER. Pt verbalized understanding and has no questions at this time

## 2023-03-29 NOTE — Discharge Instructions (Signed)
Return to the ER for worsening symptoms, persistent vomiting, difficulty breathing or other concerns. °

## 2023-03-30 ENCOUNTER — Emergency Department
Admission: EM | Admit: 2023-03-30 | Discharge: 2023-03-30 | Disposition: A | Discharge status: Home / Self Care | Payer: Medicare Other | Source: Home / Self Care | Attending: Emergency Medicine | Admitting: Emergency Medicine

## 2023-03-30 ENCOUNTER — Other Ambulatory Visit: Payer: Self-pay | Admitting: Family Medicine

## 2023-03-30 DIAGNOSIS — T83091A Other mechanical complication of indwelling urethral catheter, initial encounter: Secondary | ICD-10-CM

## 2023-03-30 DIAGNOSIS — R310 Gross hematuria: Secondary | ICD-10-CM

## 2023-03-30 DIAGNOSIS — N401 Enlarged prostate with lower urinary tract symptoms: Secondary | ICD-10-CM | POA: Diagnosis not present

## 2023-03-30 DIAGNOSIS — R338 Other retention of urine: Secondary | ICD-10-CM | POA: Diagnosis not present

## 2023-03-30 DIAGNOSIS — C61 Malignant neoplasm of prostate: Secondary | ICD-10-CM | POA: Diagnosis not present

## 2023-03-30 DIAGNOSIS — G4700 Insomnia, unspecified: Secondary | ICD-10-CM

## 2023-03-30 DIAGNOSIS — R319 Hematuria, unspecified: Secondary | ICD-10-CM | POA: Diagnosis not present

## 2023-03-30 NOTE — ED Notes (Signed)
Pt catheter flushed and of dark red urine returned.

## 2023-03-30 NOTE — Discharge Instructions (Addendum)
Please continue your antibiotics as prescribed.  Please follow-up with urology as an outpatient in the morning as scheduled.

## 2023-03-30 NOTE — ED Provider Notes (Signed)
[facility] Provider Note    Event Date/Time   First MD Initiated Contact with Patient 03/30/23 0121     (approximate)   History   Urinary Retention   HPI  [name] is a 77 y.o. male with history of prostate cancer, peripheral vascular disease on Plavix and aspirin who presents to the emergency department with hematuria and Foley catheter obstruction.  Patient has been seen several times for the same.  Foley catheter was placed at [facility] on 03/20/2023.  He reports it was recently exchanged.  It was able to be flushed yesterday and started draining.  He states that it is not draining today like it should and has been leaking around the catheter from the urethral meatus.  No clots seen in the Foley catheter bag.  No fever.  He is on antibiotics for UTI.  Has an appointment to see his urologist in the morning.   History provided by patient.    Past Medical History:  Diagnosis Date   Abnormal prostate specific antigen 01/23/2015   Actinic keratosis    Acute bacterial sinusitis 01/23/2015   Basal cell carcinoma 03/18/2020   right cheek, excised 06/04/20   Cannot sleep 01/23/2015   Disease caused by virus 01/23/2015   Diverticulitis 01/23/2015   Diverticulosis    History of tobacco use 07/26/2009   PNA (pneumonia) 01/23/2015   Prostate cancer (HCC)    observing   Severe obstructive sleep apnea 01/23/2015    Past Surgical History:  Procedure Laterality Date   APPENDECTOMY     BACK SURGERY     CATARACT EXTRACTION Left    COLONOSCOPY     FEMORAL-TIBIAL BYPASS GRAFT Left 05/13/2022   Procedure: BYPASS GRAFT FEMORAL-TIBIAL ARTERY (POST TIBIAL);  Surgeon: Renford Dills, MD;  Location: [facility] ORS;  Service: Vascular;  Laterality: Left;   LEG SURGERY Right    meniscus repair   LOWER EXTREMITY ANGIOGRAPHY Left 03/24/2022   Procedure: Lower Extremity Angiography;  Surgeon: Renford Dills, MD;  Location: [facility] INVASIVE CV LAB;  Service:  Cardiovascular;  Laterality: Left;   LOWER EXTREMITY ANGIOGRAPHY Left 07/14/2022   Procedure: Lower Extremity Angiography;  Surgeon: Renford Dills, MD;  Location: [facility] INVASIVE CV LAB;  Service: Cardiovascular;  Laterality: Left;    MEDICATIONS:  Prior to Admission medications   Medication Sig Start Date End Date Taking? Authorizing Provider  albuterol (VENTOLIN HFA) 108 (90 Base) MCG/ACT inhaler INHALE 2 PUFFS BY MOUTH EVERY 6 HOURS AS NEEDED FOR WHEEZE OR SHORTNESS OF BREATH 03/17/23   [name], MD  ALPRAZolam [name]) 1 MG tablet TAKE 1/2-1 TABLET BY MOUTH AT BEDTIME 10/09/22   [name], MD  [facility] ELLIPTA 62.5-25 MCG/ACT AEPB INHALE 1 PUFF BY MOUTH EVERY DAY 02/23/23   [name], MD  aspirin EC 81 MG tablet Take 1 tablet (81 mg total) by mouth daily. Swallow whole. 07/14/22 07/14/23  [name], [name], MD  atorvastatin (LIPITOR) 20 MG tablet TAKE 1 TABLET BY MOUTH EVERY DAY IN THE EVENING 12/04/22   [name], NP  ciprofloxacin (CIPRO) 500 MG tablet Take 1 tablet (500 mg total) by mouth 2 (two) times daily for 7 days. 03/24/23 03/31/23  [name], MD  clopidogrel (PLAVIX) 75 MG tablet TAKE 1 TABLET BY MOUTH EVERY DAY 03/01/23   [name], NP  doxycycline (VIBRAMYCIN) 50 MG capsule TAKE 1 CAPSULE BY MOUTH EVERY DAY 06/23/22   [name], MD  Physical Exam   Triage Vital Signs: ED Triage Vitals  Encounter Vitals Group     BP 03/29/23 2255 135/72     Systolic BP Percentile --      Diastolic BP Percentile --      Pulse Rate 03/29/23 2255 68     Resp 03/29/23 2255 18     Temp 03/29/23 2255 98 F (36.7 C)     Temp Source 03/29/23 2255 Oral     SpO2 03/29/23 2255 96 %     Weight 03/29/23 2256 222 lb 10.6 oz (101 kg)     Height 03/29/23 2256 6\' 1"  (1.854 m)     Head Circumference --      Peak Flow --      Pain Score 03/29/23 2256 5     Pain Loc --      Pain Education --      Exclude from Growth Chart --     Most recent vital  signs: Vitals:   03/29/23 2255  BP: 135/72  Pulse: 68  Resp: 18  Temp: 98 F (36.7 C)  SpO2: 96%    CONSTITUTIONAL: Alert, responds appropriately to questions. Well-appearing; well-nourished HEAD: Normocephalic, atraumatic EYES: Conjunctivae clear, pupils appear equal, sclera nonicteric ENT: normal nose; moist mucous membranes NECK: Supple, normal ROM CARD: RRR; S1 and S2 appreciated RESP: Normal chest excursion without splinting or tachypnea; breath sounds clear and equal bilaterally; no wheezes, no rhonchi, no rales, no hypoxia or respiratory distress, speaking full sentences ABD/GI: Non-distended; soft, non-tender, no rebound, no guarding, no peritoneal signs BACK: The back appears normal EXT: Normal ROM in all joints; no deformity noted, no edema SKIN: Normal color for age and race; warm; no rash on exposed skin NEURO: Moves all extremities equally, normal speech PSYCH: The patient's mood and manner are appropriate.   ED Results / Procedures / Treatments   LABS: (all labs ordered are listed, but only abnormal results are displayed) Labs Reviewed - No data to display   EKG:  EKG Interpretation Date/Time:    Ventricular Rate:    PR Interval:    QRS Duration:    QT Interval:    QTC Calculation:   R Axis:      Text Interpretation:           RADIOLOGY: My personal review and interpretation of imaging:    I have personally reviewed all radiology reports.   No results found.   PROCEDURES:  Critical Care performed: No     Procedures    IMPRESSION / MDM / ASSESSMENT AND PLAN / ED COURSE  I reviewed the triage vital signs and the nursing notes.    Patient here with Foley catheter obstruction.  Does have gross hematuria.  On aspirin and Plavix.  Has an appointment to see his urologist in the morning.  The patient is on the cardiac monitor to evaluate for evidence of arrhythmia and/or significant heart rate changes.   DIFFERENTIAL DIAGNOSIS  (includes but not limited to):   Hematuria causing Foley catheter obstruction, UTI, Foley dysfunction   Patient's presentation is most consistent with acute presentation with potential threat to life or bodily function.   PLAN: We are able to flush his catheter and get out about 450 mL of red-tinged urine.  No clots seen.  He is feeling better.  Patient would prefer outpatient follow-up with his urologist in the morning as previously scheduled.  He is already on antibiotics for UTI.  He is well-appearing here, afebrile, hemodynamically  stable.  I feel he is safe for discharge with close outpatient follow-up.   MEDICATIONS GIVEN IN ED: Medications - No data to display   ED COURSE:  At this time, I do not feel there is any life-threatening condition present. I reviewed all nursing notes, vitals, pertinent previous records.  All lab and urine results, EKGs, imaging ordered have been independently reviewed and interpreted by myself.  I reviewed all available radiology reports from any imaging ordered this visit.  Based on my assessment, I feel the patient is safe to be discharged home without further emergent workup and can continue workup as an outpatient as needed. Discussed all findings, treatment plan as well as usual and customary return precautions.  They verbalize understanding and are comfortable with this plan.  Outpatient follow-up has been provided as needed.  All questions have been answered.    CONSULTS:  none   OUTSIDE RECORDS REVIEWED: Reviewed last urology note at [facility] in September 2024.       FINAL CLINICAL IMPRESSION(S) / ED DIAGNOSES   Final diagnoses:  Obstructed Foley catheter, initial encounter (HCC)  Gross hematuria     Rx / DC Orders   ED Discharge Orders     None        Note:  This document was prepared using Dragon voice recognition software and may include unintentional dictation errors.   Dlynn Ranes, Layla Maw, DO 03/30/23 [phone]

## 2023-03-31 DIAGNOSIS — C61 Malignant neoplasm of prostate: Secondary | ICD-10-CM | POA: Diagnosis not present

## 2023-03-31 DIAGNOSIS — F17210 Nicotine dependence, cigarettes, uncomplicated: Secondary | ICD-10-CM | POA: Diagnosis not present

## 2023-03-31 DIAGNOSIS — Z7902 Long term (current) use of antithrombotics/antiplatelets: Secondary | ICD-10-CM | POA: Diagnosis not present

## 2023-03-31 DIAGNOSIS — R339 Retention of urine, unspecified: Secondary | ICD-10-CM | POA: Diagnosis not present

## 2023-03-31 DIAGNOSIS — R338 Other retention of urine: Secondary | ICD-10-CM | POA: Diagnosis not present

## 2023-03-31 DIAGNOSIS — R319 Hematuria, unspecified: Secondary | ICD-10-CM | POA: Diagnosis not present

## 2023-03-31 NOTE — Telephone Encounter (Signed)
Requested medication (s) are due for refill today - yes  Requested medication (s) are on the active medication list -yes  Future visit scheduled -yes  Last refill: 10/09/22 #30 3RF  Notes to clinic: non delegated Rx  Requested Prescriptions  Pending Prescriptions Disp Refills   ALPRAZolam (XANAX) 1 MG tablet [Pharmacy Med Name: ALPRAZOLAM 1 MG TABLET] 30 tablet     Sig: TAKE 1/2 TO 1 TABLET BY MOUTH AT BEDTIME     Not Delegated - Psychiatry: Anxiolytics/Hypnotics 2 Failed - 03/30/2023 11:05 AM      Failed - This refill cannot be delegated      Failed - Urine Drug Screen completed in last 360 days      Passed - Patient is not pregnant      Passed - Valid encounter within last 6 months    Recent Outpatient Visits           1 year ago Umbilical hernia without obstruction and without gangrene   [facility] [name], MD   2 years ago Insomnia, unspecified type   [facility] [name], MD   2 years ago Prostate cancer [facility])   [facility] Osvaldo Angst M, [place]   4 years ago Sleep disturbance   [facility] [name], MD   4 years ago Bronchitis   [facility] Chrismon, Jodell Cipro, [place]       Future Appointments             In 3 months [name], MD [facility]               Requested Prescriptions  Pending Prescriptions Disp Refills   ALPRAZolam (XANAX) 1 MG tablet [Pharmacy Med Name: ALPRAZOLAM 1 MG TABLET] 30 tablet     Sig: TAKE 1/2 TO 1 TABLET BY MOUTH AT BEDTIME     Not Delegated - Psychiatry: Anxiolytics/Hypnotics 2 Failed - 03/30/2023 11:05 AM      Failed - This refill cannot be delegated      Failed - Urine Drug Screen completed in last 360 days      Passed - Patient is not pregnant      Passed - Valid encounter within last 6 months    Recent Outpatient Visits            1 year ago Umbilical hernia without obstruction and without gangrene   [facility] [name], MD   2 years ago Insomnia, unspecified type   [facility] [name], MD   2 years ago Prostate cancer [facility])   [facility] [place], Ricki Rodriguez [place], [place]   4 years ago Sleep disturbance   [facility] [name], MD   4 years ago Bronchitis   [facility] Chrismon, Jodell Cipro, [place]       Future Appointments             In 3 months [name], MD [facility]

## 2023-04-05 ENCOUNTER — Other Ambulatory Visit: Payer: Self-pay

## 2023-04-05 ENCOUNTER — Emergency Department
Admission: EM | Admit: 2023-04-05 | Discharge: 2023-04-05 | Disposition: A | Discharge status: Home / Self Care | Payer: Medicare Other | Attending: Student in an Organized Health Care Education/Training Program | Admitting: Student in an Organized Health Care Education/Training Program

## 2023-04-05 DIAGNOSIS — R339 Retention of urine, unspecified: Secondary | ICD-10-CM | POA: Insufficient documentation

## 2023-04-05 DIAGNOSIS — Z8546 Personal history of malignant neoplasm of prostate: Secondary | ICD-10-CM | POA: Diagnosis not present

## 2023-04-05 LAB — URINALYSIS, ROUTINE W REFLEX MICROSCOPIC
Bilirubin Urine: NEGATIVE
Glucose, UA: NEGATIVE mg/dL
Ketones, ur: NEGATIVE mg/dL
Leukocytes,Ua: NEGATIVE
Nitrite: NEGATIVE
Protein, ur: NEGATIVE mg/dL
RBC / HPF: >50 RBC/hpf (ref 0–5)
Specific Gravity, Urine: 1.004 — ABNORMAL LOW (ref 1.005–1.030)
Squamous Epithelial / HPF: 0 /[HPF] (ref 0–5)
pH: 5 (ref 5.0–8.0)

## 2023-04-05 NOTE — ED Provider Notes (Signed)
[facility] Provider Note    Event Date/Time   First MD Initiated Contact with Patient 04/05/23 2109     (approximate)   History   Urinary catheter Issue   HPI  SENNA STOTZ is a 77 y.o. male with a history of prostate cancer and multiple recent visits to ER for urinary retention presents to the ER having difficulty emptying his bladder with Foley catheter in place.  Stated that he noted 1 clot has been unable to flush it at home.  Did complete a course of antibiotics denies any fevers or chills.  Is quite uncomfortable with over 800 cc in his bladder.     Physical Exam   Triage Vital Signs: ED Triage Vitals  Encounter Vitals Group     BP 04/05/23 2103 (!) 178/93     Systolic BP Percentile --      Diastolic BP Percentile --      Pulse Rate 04/05/23 2103 91     Resp 04/05/23 2103 20     Temp 04/05/23 2103 98.1 F (36.7 C)     Temp Source 04/05/23 2103 Oral     SpO2 04/05/23 2103 96 %     Weight --      Height --      Head Circumference --      Peak Flow --      Pain Score 04/05/23 2100 10     Pain Loc --      Pain Education --      Exclude from Growth Chart --     Most recent vital signs: Vitals:   04/05/23 2103  BP: (!) 178/93  Pulse: 91  Resp: 20  Temp: 98.1 F (36.7 C)  SpO2: 96%     Constitutional: Alert  Eyes: Conjunctivae are normal.  Head: Atraumatic. Nose: No congestion/rhinnorhea. Mouth/Throat: Mucous membranes are moist.   Neck: Painless ROM.  Cardiovascular:   Good peripheral circulation. Respiratory: Normal respiratory effort.  No retractions.  Gastrointestinal: Soft but with suprapubic fullness. Musculoskeletal:  no deformity Neurologic:  MAE spontaneously. No gross focal neurologic deficits are appreciated.  Skin:  Skin is warm, dry and intact. No rash noted. Psychiatric: Mood and affect are normal. Speech and behavior are normal.    ED Results / Procedures / Treatments   Labs (all labs ordered are  listed, but only abnormal results are displayed) Labs Reviewed  URINALYSIS, ROUTINE W REFLEX MICROSCOPIC - Abnormal; Notable for the following components:      Result Value   Color, Urine YELLOW (*)    APPearance HAZY (*)    Specific Gravity, Urine 1.004 (*)    Hgb urine dipstick LARGE (*)    Bacteria, UA RARE (*)    All other components within normal limits     EKG     RADIOLOGY    PROCEDURES:  Critical Care performed:   Procedures   MEDICATIONS ORDERED IN ED: Medications - No data to display   IMPRESSION / MDM / ASSESSMENT AND PLAN / ED COURSE  I reviewed the triage vital signs and the nursing notes.                              Differential diagnosis includes, but is not limited to, urinary tract infection, obstructed urinary Foley catheter, displaced Foley catheter  Patient presented the ER for evaluation of urinary retention in the setting of Foley catheter placement.  Unable to  flush.  When removed did not have a significant clot burden I do suspect it might have been distally displaced as catheter was reinserted with clear draining urine no cloudiness or purulence.  No clots.  Patient with complete resolution of symptoms.  Does have follow-up with urology at [facility] but asking about referral for second opinion locally is a still to have a long-term plan regarding his urinary retention.  He does appear stable and appropriate for outpatient follow-up.       FINAL CLINICAL IMPRESSION(S) / ED DIAGNOSES   Final diagnoses:  Urinary retention     Rx / DC Orders   ED Discharge Orders     None        Note:  This document was prepared using Dragon voice recognition software and may include unintentional dictation errors.    Willy Eddy, MD 04/05/23 2227

## 2023-04-05 NOTE — ED Triage Notes (Signed)
Pt to ED via POV c/o urinary catheter not draining. Pt says it has been an hour since it has drained. Pt tried flushing it but there was no drainage.

## 2023-04-05 NOTE — ED Notes (Signed)
Irrigation unsuccessful, cathter removed and replaced with new. Doc was at bedside.

## 2023-04-05 NOTE — ED Notes (Signed)
Pt educated on how to change leg bag and standard collection bag, how to empty catheter, and to come back to ED with any new or worsening symptoms. Pt verbalized understanding.

## 2023-04-11 ENCOUNTER — Other Ambulatory Visit: Payer: Self-pay | Admitting: Family Medicine

## 2023-04-11 DIAGNOSIS — J40 Bronchitis, not specified as acute or chronic: Secondary | ICD-10-CM

## 2023-04-14 NOTE — Telephone Encounter (Signed)
Requested Prescriptions  Pending Prescriptions Disp Refills   albuterol (VENTOLIN HFA) 108 (90 Base) MCG/ACT inhaler [Pharmacy Med Name: VENTOLIN HFA 90 MCG INHALER] 18 each 0    Sig: INHALE 2 PUFFS BY MOUTH EVERY 6 HOURS AS NEEDED FOR WHEEZE OR SHORTNESS OF BREATH     Pulmonology:  Beta Agonists 2 Failed - 04/11/2023  8:47 AM      Failed - Last BP in normal range    BP Readings from Last 1 Encounters:  04/05/23 (!) 170/77         Passed - Last Heart Rate in normal range    Pulse Readings from Last 1 Encounters:  04/05/23 91         Passed - Valid encounter within last 12 months    Recent Outpatient Visits           1 year ago Umbilical hernia without obstruction and without gangrene   [facility] [name], MD   2 years ago Insomnia, unspecified type   [facility] [name], MD   2 years ago Prostate cancer [facility])   [facility] [place], Ricki Rodriguez [place], [place]   4 years ago Sleep disturbance   [facility] [name], MD   5 years ago Bronchitis   [facility] [place], Jodell [place], [place]       Future Appointments             In 3 months [name], MD [facility]

## 2023-04-16 DIAGNOSIS — R310 Gross hematuria: Secondary | ICD-10-CM | POA: Diagnosis not present

## 2023-04-16 DIAGNOSIS — R339 Retention of urine, unspecified: Secondary | ICD-10-CM | POA: Diagnosis not present

## 2023-04-28 ENCOUNTER — Other Ambulatory Visit: Payer: Self-pay | Admitting: Dermatology

## 2023-04-28 DIAGNOSIS — L719 Rosacea, unspecified: Secondary | ICD-10-CM

## 2023-04-28 DIAGNOSIS — C61 Malignant neoplasm of prostate: Secondary | ICD-10-CM | POA: Diagnosis not present

## 2023-04-28 DIAGNOSIS — Z8546 Personal history of malignant neoplasm of prostate: Secondary | ICD-10-CM | POA: Diagnosis not present

## 2023-05-10 ENCOUNTER — Other Ambulatory Visit: Payer: Self-pay | Admitting: Family Medicine

## 2023-05-10 DIAGNOSIS — J40 Bronchitis, not specified as acute or chronic: Secondary | ICD-10-CM

## 2023-05-13 ENCOUNTER — Other Ambulatory Visit (INDEPENDENT_AMBULATORY_CARE_PROVIDER_SITE_OTHER): Payer: Self-pay | Admitting: Nurse Practitioner

## 2023-05-13 DIAGNOSIS — S93401A Sprain of unspecified ligament of right ankle, initial encounter: Secondary | ICD-10-CM | POA: Diagnosis not present

## 2023-05-13 DIAGNOSIS — Z9889 Other specified postprocedural states: Secondary | ICD-10-CM

## 2023-05-16 DIAGNOSIS — I70219 Atherosclerosis of native arteries of extremities with intermittent claudication, unspecified extremity: Secondary | ICD-10-CM | POA: Insufficient documentation

## 2023-05-16 NOTE — Progress Notes (Signed)
 MRN : [id number]  [name] is a 78 y.o. (Jan 25, 1946) male who presents with chief complaint of check circulation.  History of Present Illness:  Patient returns to the office for follow-up regarding his atherosclerotic occlusive disease.  He is status post bypass and subsequent salvage intervention.  Procedure 07/14/2022:  Percutaneous transluminal angioplasty and stent placement left saphenous vein bypass.    Procedure 1./07/2022: left common femoral artery to left posterior tibial artery bypass with in situ great saphenous vein. Vein patch angioplasty of the proximal portion of the vein bypass.  There have been no interval changes in lower extremity symptoms. No interval shortening of the patient's claudication distance or development of rest pain symptoms. No new ulcers or wounds have occurred since the last visit.  However, he has still has some delayed wound healing with the ulceration on his left ankle.   There have been no significant changes to the patient's overall health care.   The patient denies amaurosis fugax or recent TIA symptoms. There are no documented recent neurological changes noted. There is no history of DVT, PE or superficial thrombophlebitis. The patient denies recent episodes of angina or shortness of breath.    ABI Rt=1.20 and Lt=1.02  (previous ABI's Rt=1.21 and Lt=1.18)  Duplex ultrasound of the arterial duplexes show primarily biphasic and triphasic waveforms throughout the left lower extremity with a widely patent bypass graft and stent placement.    No outpatient medications have been marked as taking for the 05/17/23 encounter (Appointment) with Jama, Cordella MATSU, MD.    Past Medical History:  Diagnosis Date   Abnormal prostate specific antigen 01/23/2015   Actinic keratosis    Acute bacterial sinusitis 01/23/2015   Basal cell carcinoma 03/18/2020   right cheek, excised  06/04/20   Cannot sleep 01/23/2015   Disease caused by virus 01/23/2015   Diverticulitis 01/23/2015   Diverticulosis    History of tobacco use 07/26/2009   PNA (pneumonia) 01/23/2015   Prostate cancer (HCC)    observing   Severe obstructive sleep apnea 01/23/2015    Past Surgical History:  Procedure Laterality Date   APPENDECTOMY     BACK SURGERY     CATARACT EXTRACTION Left    COLONOSCOPY     FEMORAL-TIBIAL BYPASS GRAFT Left 05/13/2022   Procedure: BYPASS GRAFT FEMORAL-TIBIAL ARTERY (POST TIBIAL);  Surgeon: Jama Cordella MATSU, MD;  Location: [facility] ORS;  Service: Vascular;  Laterality: Left;   LEG SURGERY Right    meniscus repair   LOWER EXTREMITY ANGIOGRAPHY Left 03/24/2022   Procedure: Lower Extremity Angiography;  Surgeon: Jama Cordella MATSU, MD;  Location: [facility] INVASIVE CV LAB;  Service: Cardiovascular;  Laterality: Left;   LOWER EXTREMITY ANGIOGRAPHY Left 07/14/2022   Procedure: Lower Extremity Angiography;  Surgeon: Jama Cordella MATSU, MD;  Location: [facility] INVASIVE CV LAB;  Service: Cardiovascular;  Laterality: Left;    Social History Social History   Tobacco Use   Smoking status: Former    Current packs/day: 0.75    Average packs/day: 0.8 packs/day for 56.0 years (42.0 ttl pk-yrs)    Types: Cigarettes   Smokeless tobacco:  Never   Tobacco comments:    Started smoking age 40, usually about 3/4 ppd Quit in 2022  Vaping Use   Vaping status: Never Used  Substance Use Topics   Alcohol use: Yes    Alcohol/week: 14.0 standard drinks of alcohol    Types: 14 Glasses of wine per week   Drug use: No    Family History Family History  Problem Relation Age of Onset   Prostate cancer Father    Heart disease Father        MI in his 38s, but died at age 29    Allergies  Allergen Reactions   Penicillins Hives     REVIEW OF SYSTEMS (Negative unless checked)  Constitutional: [] Weight loss  [] Fever  [] Chills Cardiac: [] Chest pain   [] Chest pressure   [] Palpitations    [] Shortness of breath when laying flat   [] Shortness of breath with exertion. Vascular:  [x] Pain in legs with walking   [] Pain in legs at rest  [] History of DVT   [] Phlebitis   [] Swelling in legs   [] Varicose veins   [] Non-healing ulcers Pulmonary:   [] Uses home oxygen   [] Productive cough   [] Hemoptysis   [] Wheeze  [] COPD   [] Asthma Neurologic:  [] Dizziness   [] Seizures   [] History of stroke   [] History of TIA  [] Aphasia   [] Vissual changes   [] Weakness or numbness in arm   [] Weakness or numbness in leg Musculoskeletal:   [] Joint swelling   [] Joint pain   [] Low back pain Hematologic:  [] Easy bruising  [] Easy bleeding   [] Hypercoagulable state   [] Anemic Gastrointestinal:  [] Diarrhea   [] Vomiting  [] Gastroesophageal reflux/heartburn   [] Difficulty swallowing. Genitourinary:  [] Chronic kidney disease   [] Difficult urination  [] Frequent urination   [] Blood in urine Skin:  [] Rashes   [] Ulcers  Psychological:  [] History of anxiety   []  History of major depression.  Physical Examination  There were no vitals filed for this visit. There is no height or weight on file to calculate BMI. Gen: WD/WN, NAD Head: [place]/AT, No temporalis wasting.  Ear/Nose/Throat: Hearing grossly intact, nares w/o erythema or drainage Eyes: PER, EOMI, sclera nonicteric.  Neck: Supple, no masses.  No bruit or JVD.  Pulmonary:  Good air movement, no audible wheezing, no use of accessory muscles.  Cardiac: RRR, normal S1, S2, no Murmurs. Vascular:  mild trophic changes, no open wounds Vessel Right Left  Radial Palpable Palpable  PT Palpable Not Palpable  DP Palpable Not Palpable  Gastrointestinal: soft, non-distended. No guarding/no peritoneal signs.  Musculoskeletal: M/S 5/5 throughout.  No visible deformity.  Neurologic: CN 2-12 intact. Pain and light touch intact in extremities.  Symmetrical.  Speech is fluent. Motor exam as listed above. Psychiatric: Judgment intact, Mood & affect appropriate for pt's clinical  situation. Dermatologic: No rashes or ulcers noted.  No changes consistent with cellulitis.   CBC Lab Results  Component Value Date   WBC 11.3 (H) 03/24/2023   HGB 15.6 03/24/2023   HCT 46.5 03/24/2023   MCV 96.1 03/24/2023   PLT 194 03/24/2023    BMET    Component Value Date/Time   NA 138 03/24/2023 1926   K 3.7 03/24/2023 1926   CL 105 03/24/2023 1926   CO2 23 03/24/2023 1926   GLUCOSE 109 (H) 03/24/2023 1926   BUN 21 03/24/2023 1926   CREATININE 0.82 03/24/2023 1926   CALCIUM  9.1 03/24/2023 1926   GFRNONAA >60 03/24/2023 1926   GFRAA >60 10/09/2017 1931   CrCl  cannot be calculated (Patient's most recent lab result is older than the maximum 21 days allowed.).  COAG No results found for: INR, PROTIME  Radiology No results found.   Assessment/Plan 1. Atherosclerosis of native artery of both lower extremities with intermittent claudication (HCC) (Primary)  Recommend:  The patient has evidence of atherosclerosis of the lower extremities with claudication.  He is status post femoral distal bypass January 2024.  Bypass remains patent with ABI.  The patient does not voice lifestyle limiting changes at this point in time.  Noninvasive studies do not suggest clinically significant change.  No invasive studies, angiography or surgery at this time The patient should continue walking and begin a more formal exercise program.  The patient should continue antiplatelet therapy and aggressive treatment of the lipid abnormalities  No changes in the patient's medications at this time  Continued surveillance is indicated as atherosclerosis is likely to progress with time.    The patient will continue follow up with noninvasive studies as ordered.  - VAS US  LOWER EXTREMITY ARTERIAL DUPLEX; Future - VAS US  ABI WITH/WO TBI; Future  2. Chronic obstructive pulmonary disease, unspecified COPD type (HCC) Continue pulmonary medications and aerosols as already ordered, these  medications have been reviewed and there are no changes at this time.   3. Lumbosacral radiculitis Continue medications to treat the patient's degenerative disease as already ordered, these medications have been reviewed and there are no changes at this time.  Continued activity and therapy was stressed.  4. Hyperlipidemia, mixed Continue statin as ordered and reviewed, no changes at this time    Cordella Shawl, MD  05/16/2023 2:24 PM

## 2023-05-17 ENCOUNTER — Ambulatory Visit (INDEPENDENT_AMBULATORY_CARE_PROVIDER_SITE_OTHER): Payer: Medicare Other | Admitting: Vascular Surgery

## 2023-05-17 ENCOUNTER — Ambulatory Visit (INDEPENDENT_AMBULATORY_CARE_PROVIDER_SITE_OTHER): Payer: Medicare Other

## 2023-05-17 ENCOUNTER — Encounter (INDEPENDENT_AMBULATORY_CARE_PROVIDER_SITE_OTHER): Payer: Self-pay | Admitting: Vascular Surgery

## 2023-05-17 VITALS — BP 132/68 | HR 64 | Resp 16 | Wt 225.2 lb

## 2023-05-17 DIAGNOSIS — I70213 Atherosclerosis of native arteries of extremities with intermittent claudication, bilateral legs: Secondary | ICD-10-CM

## 2023-05-17 DIAGNOSIS — Z9889 Other specified postprocedural states: Secondary | ICD-10-CM

## 2023-05-17 DIAGNOSIS — M5417 Radiculopathy, lumbosacral region: Secondary | ICD-10-CM

## 2023-05-17 DIAGNOSIS — J449 Chronic obstructive pulmonary disease, unspecified: Secondary | ICD-10-CM | POA: Diagnosis not present

## 2023-05-17 DIAGNOSIS — I739 Peripheral vascular disease, unspecified: Secondary | ICD-10-CM | POA: Diagnosis not present

## 2023-05-17 DIAGNOSIS — E782 Mixed hyperlipidemia: Secondary | ICD-10-CM | POA: Diagnosis not present

## 2023-05-21 LAB — VAS US ABI WITH/WO TBI
Left ABI: 1.02
Right ABI: 1.2

## 2023-05-24 ENCOUNTER — Encounter (INDEPENDENT_AMBULATORY_CARE_PROVIDER_SITE_OTHER): Payer: Self-pay | Admitting: Vascular Surgery

## 2023-07-08 DIAGNOSIS — R0602 Shortness of breath: Secondary | ICD-10-CM | POA: Diagnosis not present

## 2023-07-08 DIAGNOSIS — G4733 Obstructive sleep apnea (adult) (pediatric): Secondary | ICD-10-CM | POA: Diagnosis not present

## 2023-07-09 DIAGNOSIS — R0602 Shortness of breath: Secondary | ICD-10-CM | POA: Diagnosis not present

## 2023-07-09 DIAGNOSIS — G4733 Obstructive sleep apnea (adult) (pediatric): Secondary | ICD-10-CM | POA: Diagnosis not present

## 2023-07-14 ENCOUNTER — Encounter: Payer: Self-pay | Admitting: Dermatology

## 2023-07-14 ENCOUNTER — Ambulatory Visit: Payer: Medicare Other | Admitting: Dermatology

## 2023-07-14 DIAGNOSIS — W908XXA Exposure to other nonionizing radiation, initial encounter: Secondary | ICD-10-CM | POA: Diagnosis not present

## 2023-07-14 DIAGNOSIS — L738 Other specified follicular disorders: Secondary | ICD-10-CM

## 2023-07-14 DIAGNOSIS — R351 Nocturia: Secondary | ICD-10-CM | POA: Diagnosis not present

## 2023-07-14 DIAGNOSIS — L820 Inflamed seborrheic keratosis: Secondary | ICD-10-CM

## 2023-07-14 DIAGNOSIS — L578 Other skin changes due to chronic exposure to nonionizing radiation: Secondary | ICD-10-CM | POA: Diagnosis not present

## 2023-07-14 DIAGNOSIS — L821 Other seborrheic keratosis: Secondary | ICD-10-CM

## 2023-07-14 DIAGNOSIS — R350 Frequency of micturition: Secondary | ICD-10-CM | POA: Diagnosis not present

## 2023-07-14 DIAGNOSIS — R39198 Other difficulties with micturition: Secondary | ICD-10-CM | POA: Diagnosis not present

## 2023-07-14 DIAGNOSIS — Z85828 Personal history of other malignant neoplasm of skin: Secondary | ICD-10-CM | POA: Diagnosis not present

## 2023-07-14 DIAGNOSIS — R339 Retention of urine, unspecified: Secondary | ICD-10-CM | POA: Diagnosis not present

## 2023-07-14 DIAGNOSIS — R3912 Poor urinary stream: Secondary | ICD-10-CM | POA: Diagnosis not present

## 2023-07-14 DIAGNOSIS — N401 Enlarged prostate with lower urinary tract symptoms: Secondary | ICD-10-CM | POA: Diagnosis not present

## 2023-07-14 NOTE — Progress Notes (Signed)
   Follow-Up Visit   Subjective  [name] is a 78 y.o. male who presents for the following: bumps at face that bother him when he shaves  Scabs at scalp  Isk left sideburn   Scar on right forehead for shingles   The patient has spots, moles and lesions to be evaluated, some may be new or changing and the patient may have concern these could be cancer.  The following portions of the chart were reviewed this encounter and updated as appropriate: medications, allergies, medical history  Review of Systems:  No other skin or systemic complaints except as noted in HPI or Assessment and Plan.  Objective  Well appearing patient in no apparent distress; mood and affect are within normal limits.  A focused examination was performed of the following areas: face  Relevant exam findings are noted in the Assessment and Plan.  left sideburn x 1, right cheek x 2, right mandible x 2 , left forehead hairline x 1, scalp x 1 (7) Erythematous stuck-on, waxy papule or plaque  Assessment & Plan   INFLAMED SEBORRHEIC KERATOSIS (7) left sideburn x 1, right cheek x 2, right mandible x 2 , left forehead hairline x 1, scalp x 1 (7) Symptomatic, irritating, patient would like treated. Destruction of lesion - left sideburn x 1, right cheek x 2, right mandible x 2 , left forehead hairline x 1, scalp x 1 (7) Complexity: simple   Destruction method: cryotherapy   Informed consent: discussed and consent obtained   Timeout:  patient name, date of birth, surgical site, and procedure verified Lesion destroyed using liquid nitrogen: Yes   Region frozen until ice ball extended beyond lesion: Yes   Outcome: patient tolerated procedure well with no complications   Post-procedure details: wound care instructions given    SEBORRHEIC KERATOSIS - Stuck-on, waxy, tan-brown papules and/or plaques  - Benign-appearing - Discussed benign etiology and prognosis. - Observe - Call for any changes  Sebaceous  Hyperplasia - Small yellow papules with a central dell - Benign-appearing - Observe. Call for changes.   ACTINIC DAMAGE - chronic, secondary to cumulative UV radiation exposure/sun exposure over time - diffuse scaly erythematous macules with underlying dyspigmentation - Recommend daily broad spectrum sunscreen SPF 30+ to sun-exposed areas, reapply every 2 hours as needed.  - Recommend staying in the shade or wearing long sleeves, sun glasses (UVA+UVB protection) and wide brim hats (4-inch brim around the entire circumference of the hat). - Call for new or changing lesions.  HISTORY OF BASAL CELL CARCINOMA OF THE SKIN - No evidence of recurrence today - Recommend regular full body skin exams - Recommend daily broad spectrum sunscreen SPF 30+ to sun-exposed areas, reapply every 2 hours as needed.  - Call if any new or changing lesions are noted between office visits  Return for 1 year tbse .  IAsher Muir, CMA, am acting as scribe for Armida Sans, MD.   Documentation: I have reviewed the above documentation for accuracy and completeness, and I agree with the above.  Armida Sans, MD

## 2023-07-14 NOTE — Patient Instructions (Addendum)

## 2023-08-10 IMAGING — CR DG CHEST 2V
1 series · 2 of 2 positions shown · non-contrast
Comparison: Radiograph 10/09/2017

CLINICAL DATA: Dyspnea on exertion. Shortness of breath. Patient
reports shortness of breath and cough for 2 years, worse in the last
2 months. Prior smoker.

EXAM:
CHEST - 2 VIEW

[Series 1: dg chest 2 view · 0.14mm/px · 2 of 2 slices shown]
[im 1/2]
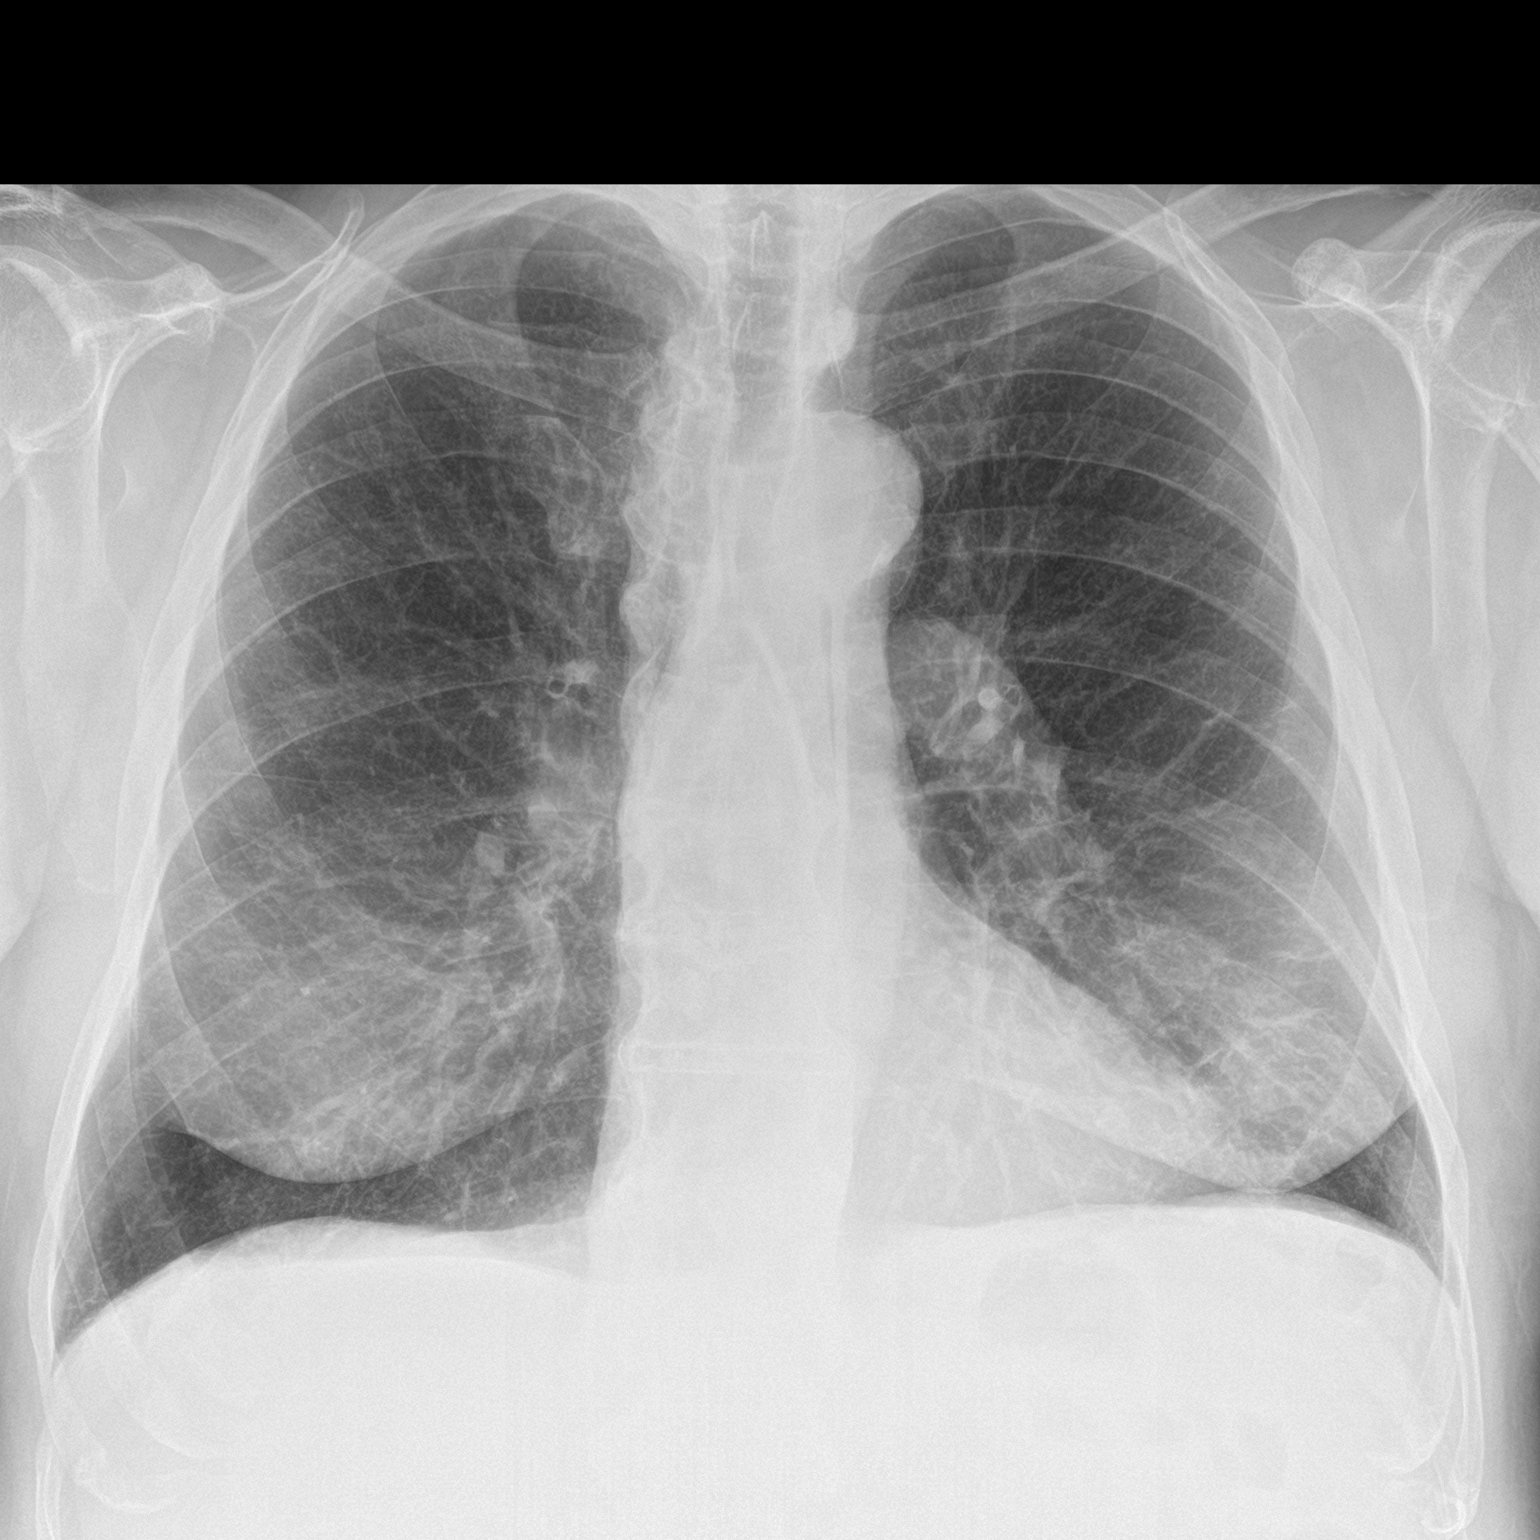
[im 2/2]
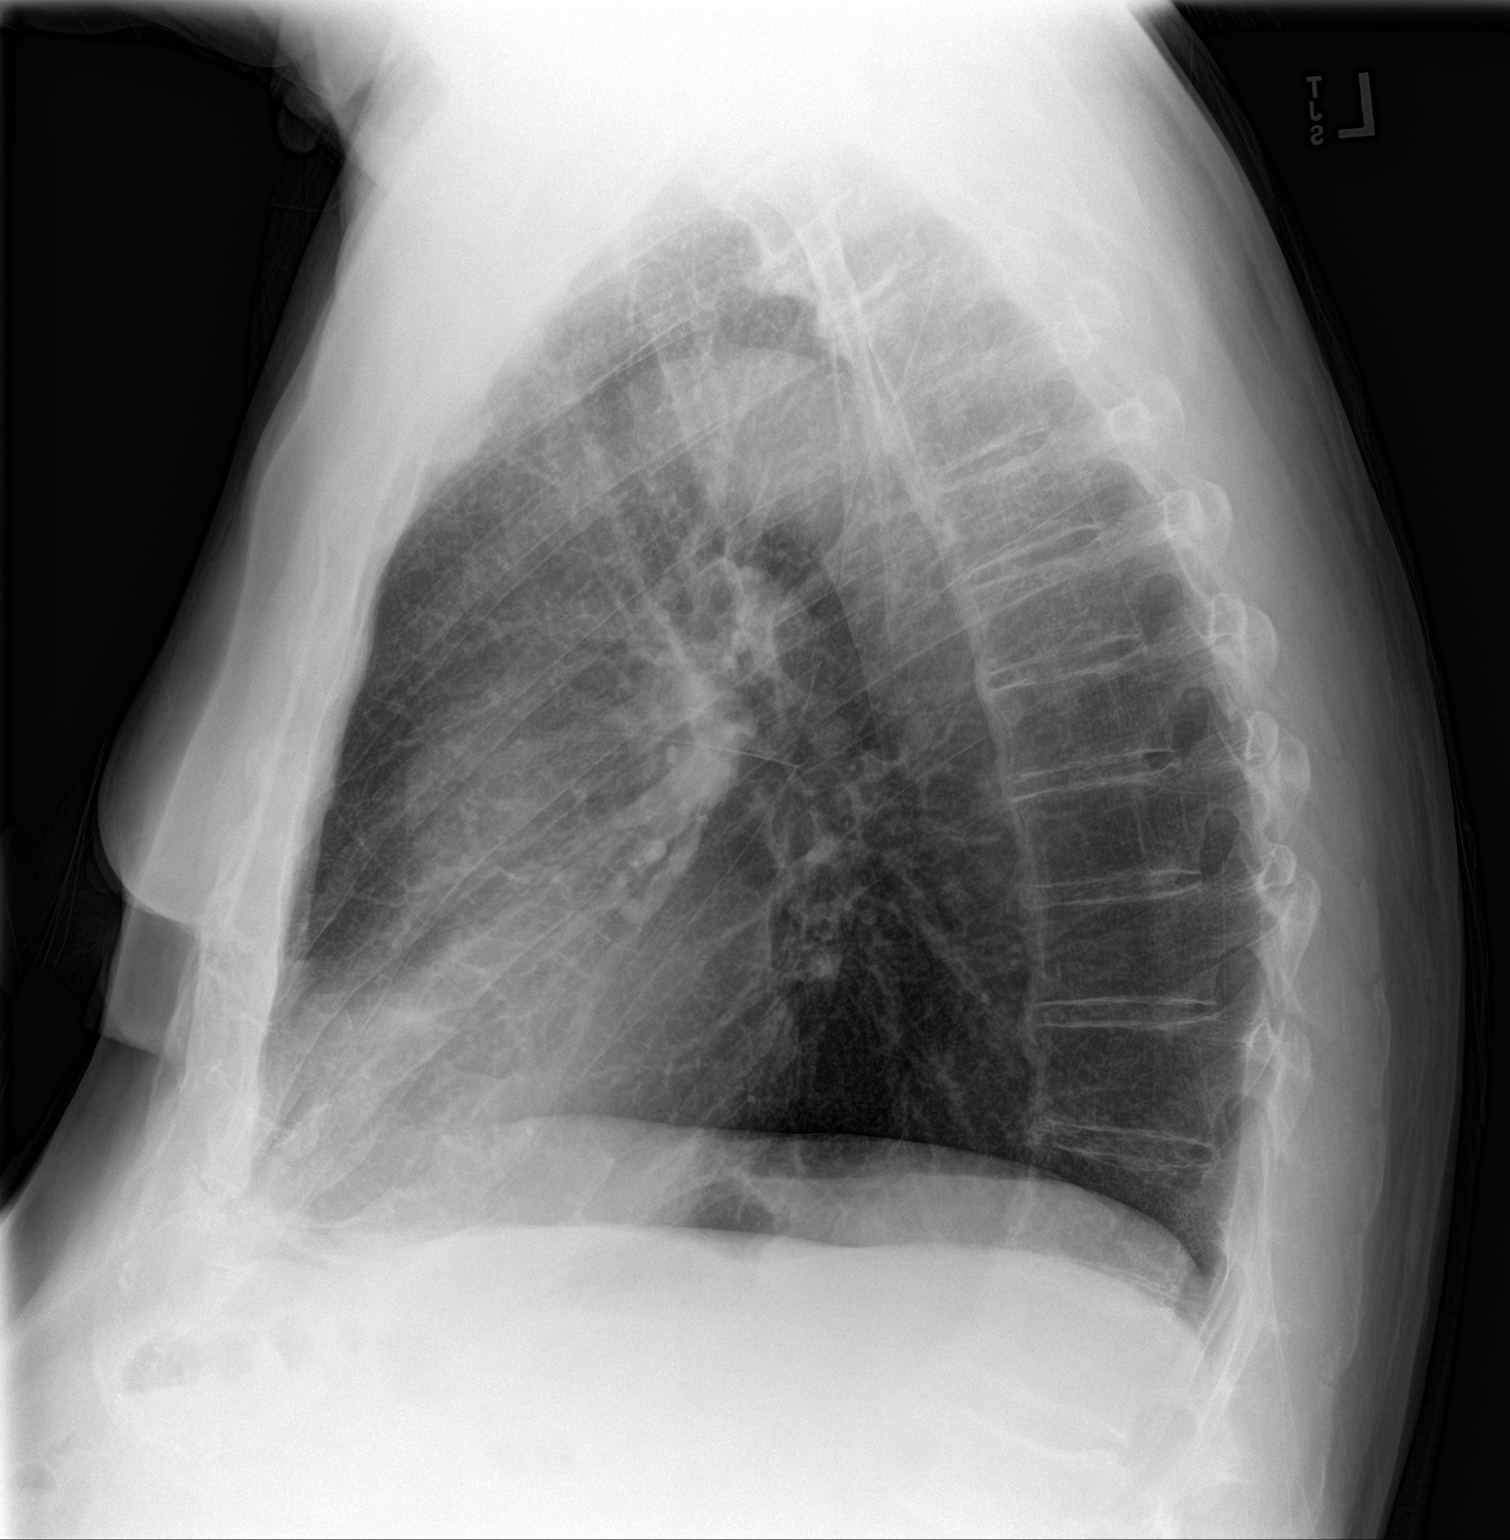

[2 of 2 positions shown; findings below may reference images not displayed]

FINDINGS: Emphysema with chronic hyperinflation and bronchial thickening. This
is similar in appearance to prior exam. Subsegmental atelectasis
and/or scarring at the left lung base, unchanged. Normal heart size
with stable mediastinal contours. No pulmonary edema, pleural
effusion, or pneumothorax. No visualized pulmonary mass. Flowing
anterior osteophytes throughout the thoracic spine.
IMPRESSION: 1. Emphysema with chronic hyperinflation and bronchial thickening.
2. No superimposed acute radiographic abnormality.

## 2023-09-21 ENCOUNTER — Other Ambulatory Visit: Payer: Self-pay | Admitting: Family Medicine

## 2023-09-21 DIAGNOSIS — G4700 Insomnia, unspecified: Secondary | ICD-10-CM

## 2023-09-28 ENCOUNTER — Encounter (INDEPENDENT_AMBULATORY_CARE_PROVIDER_SITE_OTHER): Payer: Self-pay

## 2023-10-18 DIAGNOSIS — C61 Malignant neoplasm of prostate: Secondary | ICD-10-CM | POA: Diagnosis not present

## 2023-10-21 ENCOUNTER — Telehealth: Payer: Self-pay | Admitting: Family Medicine

## 2023-10-21 DIAGNOSIS — G4700 Insomnia, unspecified: Secondary | ICD-10-CM

## 2023-10-21 NOTE — Telephone Encounter (Signed)
 This patient has not been seen in over 2 years. Cannot refill medications without follow up appointment.

## 2023-10-21 NOTE — Telephone Encounter (Signed)
 Called pt to inform but no response. Left message to callback

## 2023-10-22 MED ORDER — ALPRAZOLAM 1 MG PO TABS: 0.5000 mg | ORAL_TABLET | Freq: Every evening | ORAL | 0 refills | Status: DC | PRN

## 2023-10-22 NOTE — Telephone Encounter (Signed)
 Copied from CRM [id number]. Topic: Clinical - Medical Advice >> Oct 22, 2023  2:05 PM Ivette P wrote: Reason for CRM: Pt asked for a medication refill on xanax , med refill was denied due to pt not being seen in over 2 years. Primary Jeralene Mom MD requiring a follow up app.    Folllow up appt is scheduled for 07/11 @ 1:40 pm.   Pt would like to know what he can do in the mean while he waits until his appt.   Please follow up with pt [phone]

## 2023-10-22 NOTE — Telephone Encounter (Signed)
 Letter sent via mychart advising follow-up needed for prescription refill

## 2023-11-01 DIAGNOSIS — M25561 Pain in right knee: Secondary | ICD-10-CM | POA: Diagnosis not present

## 2023-11-01 DIAGNOSIS — M1711 Unilateral primary osteoarthritis, right knee: Secondary | ICD-10-CM | POA: Diagnosis not present

## 2023-11-08 DIAGNOSIS — M1711 Unilateral primary osteoarthritis, right knee: Secondary | ICD-10-CM | POA: Diagnosis not present

## 2023-11-15 ENCOUNTER — Encounter (INDEPENDENT_AMBULATORY_CARE_PROVIDER_SITE_OTHER): Payer: Medicare Other

## 2023-11-15 ENCOUNTER — Ambulatory Visit (INDEPENDENT_AMBULATORY_CARE_PROVIDER_SITE_OTHER): Payer: Medicare Other | Admitting: Vascular Surgery

## 2023-11-16 DIAGNOSIS — M25561 Pain in right knee: Secondary | ICD-10-CM | POA: Diagnosis not present

## 2023-11-16 DIAGNOSIS — M1711 Unilateral primary osteoarthritis, right knee: Secondary | ICD-10-CM | POA: Diagnosis not present

## 2023-11-19 ENCOUNTER — Ambulatory Visit (INDEPENDENT_AMBULATORY_CARE_PROVIDER_SITE_OTHER): Admitting: Family Medicine

## 2023-11-19 ENCOUNTER — Encounter: Payer: Self-pay | Admitting: Family Medicine

## 2023-11-19 VITALS — BP 122/70 | HR 70 | Ht 73.0 in | Wt 212.3 lb

## 2023-11-19 DIAGNOSIS — G4700 Insomnia, unspecified: Secondary | ICD-10-CM | POA: Diagnosis not present

## 2023-11-19 DIAGNOSIS — E782 Mixed hyperlipidemia: Secondary | ICD-10-CM | POA: Diagnosis not present

## 2023-11-19 DIAGNOSIS — J449 Chronic obstructive pulmonary disease, unspecified: Secondary | ICD-10-CM

## 2023-11-19 DIAGNOSIS — J40 Bronchitis, not specified as acute or chronic: Secondary | ICD-10-CM | POA: Diagnosis not present

## 2023-11-19 DIAGNOSIS — L97909 Non-pressure chronic ulcer of unspecified part of unspecified lower leg with unspecified severity: Secondary | ICD-10-CM | POA: Diagnosis not present

## 2023-11-19 DIAGNOSIS — I70299 Other atherosclerosis of native arteries of extremities, unspecified extremity: Secondary | ICD-10-CM | POA: Diagnosis not present

## 2023-11-19 MED ORDER — ALBUTEROL SULFATE HFA 108 (90 BASE) MCG/ACT IN AERS: INHALATION_SPRAY | RESPIRATORY_TRACT | 3 refills | Status: DC

## 2023-11-19 MED ORDER — ALPRAZOLAM 1 MG PO TABS: 0.5000 mg | ORAL_TABLET | Freq: Every evening | ORAL | 3 refills | Status: DC | PRN

## 2023-11-19 NOTE — Progress Notes (Signed)
 Established patient visit   Patient: [name]   DOB: Jul 30, 1945   78 y.o. Male  MRN: [id number] Visit Date: 11/19/2023  Today's healthcare provider: Nancyann Perry, MD   Chief Complaint  Patient presents with   Medical Management of Chronic Issues    Patient needs refills on Xanax  and Ventolin  inhaler   Subjective    HPI Follow up insomnia and reactive airway disease. Doing well taking 1/2 alprazolam  every night which he does consistently.Also needs refill on albuterol .   He had fem-tib bypass last year, after which he was put on atorvastatin  and clopidogrel . However he ran out of atorvastatin  a few months ago and doesn't know if he is still supposed to be taking it. He denies any side effects from the medication.   Lab Results  Component Value Date   CHOL 187 02/24/2017   HDL 67 02/24/2017   LDLCALC 105 (H) 02/24/2017   TRIG 68 02/24/2017   CHOLHDL 2.8 02/24/2017     Medications: Outpatient Medications Prior to Visit  Medication Sig   ANORO ELLIPTA  62.5-25 MCG/ACT AEPB INHALE 1 PUFF BY MOUTH EVERY DAY   clopidogrel  (PLAVIX ) 75 MG tablet TAKE 1 TABLET BY MOUTH EVERY DAY   doxycycline  (VIBRAMYCIN ) 50 MG capsule TAKE 1 CAPSULE BY MOUTH EVERY DAY   tamsulosin (FLOMAX) 0.4 MG CAPS capsule Take 0.4 mg by mouth daily.   albuterol  (VENTOLIN  HFA) 108 (90 Base) MCG/ACT inhaler INHALE 2 PUFFS BY MOUTH EVERY 6 HOURS AS NEEDED FOR WHEEZE OR SHORTNESS OF BREATH   ALPRAZolam  (XANAX ) 1 MG tablet Take 0.5-1 tablets (0.5-1 mg total) by mouth at bedtime as needed for anxiety.   atorvastatin  (LIPITOR) 20 MG tablet TAKE 1 TABLET BY MOUTH EVERY DAY IN THE EVENING (Patient not taking: Reported on 11/19/2023)   No facility-administered medications prior to visit.    Review of Systems     Objective    BP 122/70 (BP Location: Left Arm, Patient Position: Sitting, Cuff Size: Normal)   Pulse 70   Ht 6' 1 (1.854 m)   Wt 212 lb 4.8 oz (96.3 kg)   SpO2 98%   BMI 28.01 kg/m     Physical Exam   General appearance: Well developed, well nourished male, cooperative and in no acute distress Head: Normocephalic, without obvious abnormality, atraumatic Respiratory: Respirations even and unlabored, normal respiratory rate Extremities: All extremities are intact.  Skin: Skin color, texture, turgor normal. No rashes seen  Psych: Appropriate mood and affect. Neurologic: Mental status: Alert, oriented to person, place, and time, thought content appropriate.    Assessment & Plan     1. Insomnia, unspecified type (Primary) Doing well on current alprazolam  with no apparent adverse effects.  - ALPRAZolam  (XANAX ) 1 MG tablet; Take 0.5-1 tablets (0.5-1 mg total) by mouth at bedtime as needed for anxiety.  Dispense: 30 tablet; Refill: 3  2. Atherosclerosis of artery of extremity with ulceration (HCC) Was previously prescribed atorvastatin  by vascular, but he stopped because his cholesterol was not significantly elevated. Counseled that statins are beneficial for vascular disease if if cholesterol levels are normal.   - Lipid panel - Apolipoprotein B  3. Chronic obstructive pulmonary disease, unspecified COPD type (HCC) refill albuterol  (VENTOLIN  HFA) 108 (90 Base) MCG/ACT inhaler; INHALE 2 PUFFS BY MOUTH EVERY 6 HOURS AS NEEDED FOR WHEEZE OR SHORTNESS OF BREATH  Dispense: 18 each; Refill: 3  4. Hyperlipidemia, mixed  - Lipid panel - Apolipoprotein B  Nancyann Perry, MD  [facility] [phone] (phone) [phone] (fax)  [facility]

## 2023-11-20 LAB — LIPID PANEL
Chol/HDL Ratio: 2.8 ratio (ref 0.0–5.0)
Cholesterol, Total: 164 mg/dL (ref 100–199)
HDL: 59 mg/dL (ref >39)
LDL Chol Calc (NIH): 84 mg/dL (ref 0–99)
Triglycerides: 117 mg/dL (ref 0–149)
VLDL Cholesterol Cal: 21 mg/dL (ref 5–40)

## 2023-11-20 LAB — APOLIPOPROTEIN B: Apolipoprotein B: 79 mg/dL (ref <90)

## 2023-11-21 ENCOUNTER — Ambulatory Visit: Payer: Self-pay | Admitting: Family Medicine

## 2023-11-21 DIAGNOSIS — I70213 Atherosclerosis of native arteries of extremities with intermittent claudication, bilateral legs: Secondary | ICD-10-CM

## 2023-11-21 DIAGNOSIS — L719 Rosacea, unspecified: Secondary | ICD-10-CM | POA: Insufficient documentation

## 2023-11-21 MED ORDER — ATORVASTATIN CALCIUM 20 MG PO TABS: 20.0000 mg | ORAL_TABLET | Freq: Every day | ORAL | 1 refills | Status: DC

## 2023-12-02 ENCOUNTER — Other Ambulatory Visit (INDEPENDENT_AMBULATORY_CARE_PROVIDER_SITE_OTHER): Payer: Self-pay | Admitting: Vascular Surgery

## 2023-12-02 DIAGNOSIS — Z9889 Other specified postprocedural states: Secondary | ICD-10-CM

## 2023-12-05 NOTE — Progress Notes (Signed)
 MRN : [id number]  [name] is a 78 y.o. (1946-05-06) male who presents with chief complaint of check circulation.  History of Present Illness:   Patient returns to the office for follow-up regarding his atherosclerotic occlusive disease.  He is status post bypass and subsequent salvage intervention.   Procedure 07/14/2022:  Percutaneous transluminal angioplasty and stent placement left saphenous vein bypass.    Procedure 1./07/2022: left common femoral artery to left posterior tibial artery bypass with in situ great saphenous vein. Vein patch angioplasty of the proximal portion of the vein bypass.   There have been no interval changes in lower extremity symptoms. No interval shortening of the patient's claudication distance or development of rest pain symptoms. No new ulcers or wounds have occurred since the last visit.  However, he has still has some delayed wound healing with the ulceration on his left ankle.   There have been no significant changes to the patient's overall health care.   The patient denies amaurosis fugax or recent TIA symptoms. There are no documented recent neurological changes noted. There is no history of DVT, PE or superficial thrombophlebitis. The patient denies recent episodes of angina or shortness of breath.    ABI Rt=1.15 and Lt=1.19  (previous ABI's Rt=1.20 and Lt=1.02)   Duplex ultrasound of the arterial duplexes done today show primarily biphasic and triphasic waveforms throughout the left lower extremity with a widely patent bypass graft and stent placement.    No outpatient medications have been marked as taking for the 12/06/23 encounter (Appointment) with Jama, Cordella MATSU, MD.    Past Medical History:  Diagnosis Date   Abnormal prostate specific antigen 01/23/2015   Actinic keratosis    Acute bacterial sinusitis 01/23/2015   Basal cell carcinoma 03/18/2020    right cheek, excised 06/04/20   Cannot sleep 01/23/2015   Disease caused by virus 01/23/2015   Diverticulitis 01/23/2015   Diverticulosis    History of tobacco use 07/26/2009   PNA (pneumonia) 01/23/2015   Prostate cancer (HCC)    observing   Severe obstructive sleep apnea 01/23/2015    Past Surgical History:  Procedure Laterality Date   APPENDECTOMY     BACK SURGERY     CATARACT EXTRACTION Left    COLONOSCOPY     FEMORAL-TIBIAL BYPASS GRAFT Left 05/13/2022   Procedure: BYPASS GRAFT FEMORAL-TIBIAL ARTERY (POST TIBIAL);  Surgeon: Jama Cordella MATSU, MD;  Location: [facility];  Service: Vascular;  Laterality: Left;   LEG SURGERY Right    meniscus repair   LOWER EXTREMITY ANGIOGRAPHY Left 03/24/2022   Procedure: Lower Extremity Angiography;  Surgeon: Jama Cordella MATSU, MD;  Location: [facility];  Service: Cardiovascular;  Laterality: Left;   LOWER EXTREMITY ANGIOGRAPHY Left 07/14/2022   Procedure: Lower Extremity Angiography;  Surgeon: Jama Cordella MATSU, MD;  Location: [facility];  Service: Cardiovascular;  Laterality: Left;    Social History Social History   Tobacco Use   Smoking status: Former    Current packs/day: 0.75    Average packs/day: 0.8 packs/day for 56.0 years (42.0 ttl pk-yrs)  Types: Cigarettes   Smokeless tobacco: Never   Tobacco comments:    Started smoking age 15, usually about 3/4 ppd Quit in 2022  Vaping Use   Vaping status: Never Used  Substance Use Topics   Alcohol use: Yes    Alcohol/week: 14.0 standard drinks of alcohol    Types: 14 Glasses of wine per week   Drug use: No    Family History Family History  Problem Relation Age of Onset   Prostate cancer Father    Heart disease Father        MI in his 2s, but died at age 65    Allergies  Allergen Reactions   Penicillins Hives and Anaphylaxis     REVIEW OF SYSTEMS (Negative unless checked)  Constitutional: [] Weight loss  [] Fever  [] Chills Cardiac: [] Chest pain    [] Chest pressure   [] Palpitations   [] Shortness of breath when laying flat   [] Shortness of breath with exertion. Vascular:  [x] Pain in legs with walking   [] Pain in legs at rest  [] History of DVT   [] Phlebitis   [] Swelling in legs   [] Varicose veins   [] Non-healing ulcers Pulmonary:   [] Uses home oxygen   [] Productive cough   [] Hemoptysis   [] Wheeze  [] COPD   [] Asthma Neurologic:  [] Dizziness   [] Seizures   [] History of stroke   [] History of TIA  [] Aphasia   [] Vissual changes   [] Weakness or numbness in arm   [] Weakness or numbness in leg Musculoskeletal:   [] Joint swelling   [] Joint pain   [] Low back pain Hematologic:  [] Easy bruising  [] Easy bleeding   [] Hypercoagulable state   [] Anemic Gastrointestinal:  [] Diarrhea   [] Vomiting  [] Gastroesophageal reflux/heartburn   [] Difficulty swallowing. Genitourinary:  [] Chronic kidney disease   [] Difficult urination  [] Frequent urination   [] Blood in urine Skin:  [] Rashes   [] Ulcers  Psychological:  [] History of anxiety   []  History of major depression.  Physical Examination  There were no vitals filed for this visit. There is no height or weight on file to calculate BMI. Gen: WD/WN, NAD Head: [place]/AT, No temporalis wasting.  Ear/Nose/Throat: Hearing grossly intact, nares w/o erythema or drainage Eyes: PER, EOMI, sclera nonicteric.  Neck: Supple, no masses.  No bruit or JVD.  Pulmonary:  Good air movement, no audible wheezing, no use of accessory muscles.  Cardiac: RRR, normal S1, S2, no Murmurs. Vascular:  mild trophic changes, no open wounds Vessel Right Left  Radial Palpable Palpable  PT Not Palpable Not Palpable  DP Not Palpable Not Palpable  Gastrointestinal: soft, non-distended. No guarding/no peritoneal signs.  Musculoskeletal: M/S 5/5 throughout.  No visible deformity.  Neurologic: CN 2-12 intact. Pain and light touch intact in extremities.  Symmetrical.  Speech is fluent. Motor exam as listed above. Psychiatric: Judgment intact, Mood &  affect appropriate for pt's clinical situation. Dermatologic: No rashes or ulcers noted.  No changes consistent with cellulitis.   CBC Lab Results  Component Value Date   WBC 11.3 (H) 03/24/2023   HGB 15.6 03/24/2023   HCT 46.5 03/24/2023   MCV 96.1 03/24/2023   PLT 194 03/24/2023    BMET    Component Value Date/Time   NA 138 03/24/2023 1926   K 3.7 03/24/2023 1926   CL 105 03/24/2023 1926   CO2 23 03/24/2023 1926   GLUCOSE 109 (H) 03/24/2023 1926   BUN 21 03/24/2023 1926   CREATININE 0.82 03/24/2023 1926   CALCIUM  9.1 03/24/2023 1926   GFRNONAA >60 03/24/2023  1926   GFRAA >60 10/09/2017 1931   CrCl cannot be calculated (Patient's most recent lab result is older than the maximum 21 days allowed.).  COAG No results found for: INR, PROTIME  Radiology No results found.   Assessment/Plan 1. Atherosclerosis of native artery of both lower extremities with intermittent claudication (HCC) (Primary) Recommend:   The patient has evidence of atherosclerosis of the lower extremities with claudication.  He is status post femoral distal bypass January 2024.  Bypass remains patent with ABI.   The patient does not voice lifestyle limiting changes at this point in time.   Noninvasive studies do not suggest clinically significant change.   No invasive studies, angiography or surgery at this time The patient should continue walking and begin a more formal exercise program.  The patient should continue antiplatelet therapy and aggressive treatment of the lipid abnormalities   No changes in the patient's medications at this time   Continued surveillance is indicated as atherosclerosis is likely to progress with time.     The patient will continue follow up with noninvasive studies as ordered.  - VAS US  ABI WITH/WO TBI; Future  2. Chronic obstructive pulmonary disease, unspecified COPD type (HCC) Continue pulmonary medications and aerosols as already ordered, these  medications have been reviewed and there are no changes at this time.   3. Lumbar radiculopathy Continue medications to treat the patient's degenerative disease as already ordered, these medications have been reviewed and there are no changes at this time.  Continued activity and therapy was stressed.  4. Hyperlipidemia, mixed Continue statin as ordered and reviewed, no changes at this time    Cordella Shawl, MD  12/05/2023 4:36 PM

## 2023-12-06 ENCOUNTER — Ambulatory Visit (INDEPENDENT_AMBULATORY_CARE_PROVIDER_SITE_OTHER)

## 2023-12-06 ENCOUNTER — Ambulatory Visit (INDEPENDENT_AMBULATORY_CARE_PROVIDER_SITE_OTHER): Admitting: Vascular Surgery

## 2023-12-06 ENCOUNTER — Encounter (INDEPENDENT_AMBULATORY_CARE_PROVIDER_SITE_OTHER): Payer: Self-pay | Admitting: Vascular Surgery

## 2023-12-06 VITALS — BP 126/72 | HR 60 | Resp 18 | Wt 213.6 lb

## 2023-12-06 DIAGNOSIS — Z9889 Other specified postprocedural states: Secondary | ICD-10-CM

## 2023-12-06 DIAGNOSIS — I70213 Atherosclerosis of native arteries of extremities with intermittent claudication, bilateral legs: Secondary | ICD-10-CM

## 2023-12-06 DIAGNOSIS — J449 Chronic obstructive pulmonary disease, unspecified: Secondary | ICD-10-CM | POA: Diagnosis not present

## 2023-12-06 DIAGNOSIS — I739 Peripheral vascular disease, unspecified: Secondary | ICD-10-CM

## 2023-12-06 DIAGNOSIS — E782 Mixed hyperlipidemia: Secondary | ICD-10-CM

## 2023-12-06 DIAGNOSIS — M5416 Radiculopathy, lumbar region: Secondary | ICD-10-CM | POA: Diagnosis not present

## 2023-12-06 LAB — VAS US ABI WITH/WO TBI
Left ABI: 1.19
Right ABI: 1.15

## 2023-12-11 ENCOUNTER — Encounter (INDEPENDENT_AMBULATORY_CARE_PROVIDER_SITE_OTHER): Payer: Self-pay | Admitting: Vascular Surgery

## 2023-12-15 ENCOUNTER — Other Ambulatory Visit (INDEPENDENT_AMBULATORY_CARE_PROVIDER_SITE_OTHER): Payer: Self-pay | Admitting: Nurse Practitioner

## 2023-12-15 ENCOUNTER — Telehealth (INDEPENDENT_AMBULATORY_CARE_PROVIDER_SITE_OTHER): Payer: Self-pay

## 2023-12-15 MED ORDER — CLOPIDOGREL BISULFATE 75 MG PO TABS: 75.0000 mg | ORAL_TABLET | Freq: Every day | ORAL | 3 refills | Status: AC

## 2023-12-15 NOTE — Telephone Encounter (Signed)
 Patient called in reference to his prescription for Clopidogrel  75mg , once daily, he stated he has 3 pills remaining and on his end it's showing he has 1 refill remaining, but according to the pharmacy he has no refills. Patient asking if the pharmacy needs a new prescription? He also stated he gets these in a 90 day supply. Please advise.

## 2023-12-29 ENCOUNTER — Ambulatory Visit: Payer: Medicare Other

## 2024-02-24 ENCOUNTER — Other Ambulatory Visit: Payer: Self-pay | Admitting: Dermatology

## 2024-02-24 DIAGNOSIS — L719 Rosacea, unspecified: Secondary | ICD-10-CM

## 2024-03-05 ENCOUNTER — Other Ambulatory Visit: Payer: Self-pay | Admitting: Family Medicine

## 2024-03-05 DIAGNOSIS — J449 Chronic obstructive pulmonary disease, unspecified: Secondary | ICD-10-CM

## 2024-03-07 ENCOUNTER — Other Ambulatory Visit: Payer: Self-pay | Admitting: Family Medicine

## 2024-03-07 DIAGNOSIS — J449 Chronic obstructive pulmonary disease, unspecified: Secondary | ICD-10-CM

## 2024-03-07 MED ORDER — TIOTROPIUM BROMIDE-OLODATEROL 2.5-2.5 MCG/ACT IN AERS: 2.0000 | INHALATION_SPRAY | Freq: Every day | RESPIRATORY_TRACT | 12 refills | Status: AC

## 2024-03-24 DIAGNOSIS — M1711 Unilateral primary osteoarthritis, right knee: Secondary | ICD-10-CM | POA: Diagnosis not present

## 2024-04-13 DIAGNOSIS — C61 Malignant neoplasm of prostate: Secondary | ICD-10-CM | POA: Diagnosis not present

## 2024-04-20 DIAGNOSIS — C61 Malignant neoplasm of prostate: Secondary | ICD-10-CM | POA: Diagnosis not present

## 2024-04-20 DIAGNOSIS — R350 Frequency of micturition: Secondary | ICD-10-CM | POA: Diagnosis not present

## 2024-04-20 DIAGNOSIS — N401 Enlarged prostate with lower urinary tract symptoms: Secondary | ICD-10-CM | POA: Diagnosis not present

## 2024-04-20 DIAGNOSIS — R339 Retention of urine, unspecified: Secondary | ICD-10-CM | POA: Diagnosis not present

## 2024-05-07 ENCOUNTER — Other Ambulatory Visit: Payer: Self-pay | Admitting: Family Medicine

## 2024-05-07 DIAGNOSIS — G4700 Insomnia, unspecified: Secondary | ICD-10-CM

## 2024-05-13 ENCOUNTER — Other Ambulatory Visit: Payer: Self-pay | Admitting: Family Medicine

## 2024-05-13 DIAGNOSIS — I70213 Atherosclerosis of native arteries of extremities with intermittent claudication, bilateral legs: Secondary | ICD-10-CM

## 2024-05-20 ENCOUNTER — Other Ambulatory Visit: Payer: Self-pay | Admitting: Family Medicine

## 2024-05-20 DIAGNOSIS — J40 Bronchitis, not specified as acute or chronic: Secondary | ICD-10-CM

## 2024-05-23 ENCOUNTER — Other Ambulatory Visit: Payer: Self-pay

## 2024-05-23 ENCOUNTER — Encounter: Payer: Self-pay | Admitting: Ophthalmology

## 2024-05-30 ENCOUNTER — Ambulatory Visit: Payer: Self-pay | Admitting: Anesthesiology

## 2024-05-30 ENCOUNTER — Ambulatory Visit
Admission: RE | Admit: 2024-05-30 | Discharge: 2024-05-30 | Disposition: A | Discharge status: Home / Self Care | Attending: Ophthalmology | Admitting: Ophthalmology

## 2024-05-30 ENCOUNTER — Other Ambulatory Visit: Payer: Self-pay

## 2024-05-30 ENCOUNTER — Encounter
Admission: RE | Disposition: A | Discharge status: Home / Self Care | Payer: Self-pay | Source: Home / Self Care | Attending: Ophthalmology

## 2024-05-30 ENCOUNTER — Encounter: Payer: Self-pay | Admitting: Ophthalmology

## 2024-05-30 DIAGNOSIS — Z87891 Personal history of nicotine dependence: Secondary | ICD-10-CM | POA: Diagnosis not present

## 2024-05-30 DIAGNOSIS — H2511 Age-related nuclear cataract, right eye: Secondary | ICD-10-CM | POA: Insufficient documentation

## 2024-05-30 HISTORY — PX: CATARACT EXTRACTION W/PHACO: SHX586

## 2024-05-30 MED ORDER — MOXIFLOXACIN HCL 0.5 % OP SOLN
OPHTHALMIC | Status: DC | PRN
  Administered 2024-05-30: .2 mL via OPHTHALMIC

## 2024-05-30 MED ORDER — TETRACAINE HCL 0.5 % OP SOLN
1.0000 [drp] | OPHTHALMIC | Status: DC | PRN
  Administered 2024-05-30 (×3): 1 [drp] via OPHTHALMIC

## 2024-05-30 MED ORDER — CYCLOPENTOLATE HCL 2 % OP SOLN
OPHTHALMIC | Status: AC
  Filled 2024-05-30: qty 2

## 2024-05-30 MED ORDER — FENTANYL CITRATE (PF) 100 MCG/2ML IJ SOLN
INTRAMUSCULAR | Status: DC | PRN
  Administered 2024-05-30: 50 ug via INTRAVENOUS

## 2024-05-30 MED ORDER — CYCLOPENTOLATE HCL 2 % OP SOLN
1.0000 [drp] | OPHTHALMIC | Status: AC | PRN
  Administered 2024-05-30 (×3): 1 [drp] via OPHTHALMIC

## 2024-05-30 MED ORDER — TETRACAINE HCL 0.5 % OP SOLN
OPHTHALMIC | Status: AC
  Filled 2024-05-30: qty 4

## 2024-05-30 MED ORDER — LACTATED RINGERS IV SOLN: INTRAVENOUS | Status: DC

## 2024-05-30 MED ORDER — SIGHTPATH DOSE#1 BSS IO SOLN
INTRAOCULAR | Status: DC | PRN
  Administered 2024-05-30: 78 mL via OPHTHALMIC

## 2024-05-30 MED ORDER — MIDAZOLAM HCL 2 MG/2ML IJ SOLN
INTRAMUSCULAR | Status: AC
  Filled 2024-05-30: qty 2

## 2024-05-30 MED ORDER — PHENYLEPHRINE HCL 10 % OP SOLN
OPHTHALMIC | Status: AC
  Filled 2024-05-30: qty 5

## 2024-05-30 MED ORDER — SIGHTPATH DOSE#1 BSS IO SOLN
INTRAOCULAR | Status: DC | PRN
  Administered 2024-05-30: 15 mL via INTRAOCULAR

## 2024-05-30 MED ORDER — PHENYLEPHRINE HCL 10 % OP SOLN
1.0000 [drp] | OPHTHALMIC | Status: AC | PRN
  Administered 2024-05-30 (×3): 1 [drp] via OPHTHALMIC

## 2024-05-30 MED ORDER — SIGHTPATH DOSE#1 NA CHONDROIT SULF-NA HYALURON 40-17 MG/ML IO SOLN
INTRAOCULAR | Status: DC | PRN
  Administered 2024-05-30: 1 mL via INTRAOCULAR

## 2024-05-30 MED ORDER — BRIMONIDINE TARTRATE-TIMOLOL 0.2-0.5 % OP SOLN
OPHTHALMIC | Status: DC | PRN
  Administered 2024-05-30: 1 [drp] via OPHTHALMIC

## 2024-05-30 MED ORDER — MIDAZOLAM HCL (PF) 2 MG/2ML IJ SOLN
INTRAMUSCULAR | Status: DC | PRN
  Administered 2024-05-30 (×2): 1 mg via INTRAVENOUS

## 2024-05-30 MED ORDER — LIDOCAINE HCL (PF) 2 % IJ SOLN
INTRAOCULAR | Status: DC | PRN
  Administered 2024-05-30: 2 mL

## 2024-05-30 MED ORDER — FENTANYL CITRATE (PF) 100 MCG/2ML IJ SOLN
INTRAMUSCULAR | Status: AC
  Filled 2024-05-30: qty 2

## 2024-05-30 NOTE — Discharge Instructions (Signed)

## 2024-05-30 NOTE — H&P (Signed)
 [facility]   Primary Care Physician:  Gasper Nancyann BRAVO, MD Ophthalmologist: Dr. Elsie Carmine  Pre-Procedure History & Physical: HPI:  [name] is a 79 y.o. male here for cataract surgery.   Past Medical History:  Diagnosis Date   Abnormal prostate specific antigen 01/23/2015   Actinic keratosis    Acute bacterial sinusitis 01/23/2015   Basal cell carcinoma 03/18/2020   right cheek, excised 06/04/20   Cannot sleep 01/23/2015   Disease caused by virus 01/23/2015   Diverticulitis 01/23/2015   Diverticulosis    History of tobacco use 07/26/2009   PNA (pneumonia) 01/23/2015   Prostate cancer (HCC)    observing   Severe obstructive sleep apnea 01/23/2015    Past Surgical History:  Procedure Laterality Date   APPENDECTOMY     BACK SURGERY     CATARACT EXTRACTION Left    COLONOSCOPY     FEMORAL-TIBIAL BYPASS GRAFT Left 05/13/2022   Procedure: BYPASS GRAFT FEMORAL-TIBIAL ARTERY (POST TIBIAL);  Surgeon: Jama Cordella MATSU, MD;  Location: [facility];  Service: Vascular;  Laterality: Left;   LEG SURGERY Right    meniscus repair   LOWER EXTREMITY ANGIOGRAPHY Left 03/24/2022   Procedure: Lower Extremity Angiography;  Surgeon: Jama Cordella MATSU, MD;  Location: [facility];  Service: Cardiovascular;  Laterality: Left;   LOWER EXTREMITY ANGIOGRAPHY Left 07/14/2022   Procedure: Lower Extremity Angiography;  Surgeon: Jama Cordella MATSU, MD;  Location: [facility];  Service: Cardiovascular;  Laterality: Left;    Prior to Admission medications  Medication Sig Start Date End Date Taking? Authorizing Provider  ALPRAZolam  (XANAX ) 1 MG tablet TAKE 0.5-1 TABLETS (0.5-1 MG TOTAL) BY MOUTH AT BEDTIME AS NEEDED FOR ANXIETY. 05/08/24  Yes Gasper Nancyann BRAVO, MD  atorvastatin  (LIPITOR) 20 MG tablet TAKE 1 TABLET BY MOUTH EVERY DAY 05/13/24  Yes Gasper Nancyann BRAVO, MD  clopidogrel  (PLAVIX ) 75 MG tablet Take 1 tablet (75 mg total) by mouth daily. 12/15/23  Yes Brown, Fallon E,  NP  doxycycline  (VIBRAMYCIN ) 50 MG capsule TAKE 1 CAPSULE BY MOUTH EVERY DAY 02/24/24  Yes Hester Alm BROCKS, MD  tamsulosin (FLOMAX) 0.4 MG CAPS capsule Take 0.4 mg by mouth daily. 11/07/23  Yes [provider]  Tiotropium Bromide -Olodaterol 2.5-2.5 MCG/ACT AERS Inhale 2 puffs into the lungs daily. 03/07/24  Yes Gasper Nancyann BRAVO, MD  albuterol  (VENTOLIN  HFA) [phone] Base) MCG/ACT inhaler INHALE 2 PUFFS BY MOUTH EVERY 6 HOURS AS NEEDED FOR WHEEZE OR SHORTNESS OF BREATH 05/21/24   Gasper Nancyann BRAVO, MD    Allergies as of 05/17/2024 - Review Complete 12/11/2023  Allergen Reaction Noted   Penicillins Hives and Anaphylaxis 01/23/2015    Family History  Problem Relation Age of Onset   Prostate cancer Father    Heart disease Father        MI in his 94s, but died at age 88    Social History   Socioeconomic History   Marital status: Married    Spouse name: Not on file   Number of children: Not on file   Years of education: Not on file   Highest education level: Bachelor's degree (e.g., BA, AB, BS)  Occupational History   Not on file  Tobacco Use   Smoking status: Former    Current packs/day: 0.75    Average packs/day: 0.8 packs/day for 56.0 years (42.0 ttl pk-yrs)    Types: Cigarettes   Smokeless tobacco: Never   Tobacco comments:    Started smoking age  17, usually about 3/4 ppd Quit in 2022  Vaping Use   Vaping status: Never Used  Substance and Sexual Activity   Alcohol use: Yes    Alcohol/week: 14.0 standard drinks of alcohol    Types: 14 Glasses of wine per week   Drug use: No   Sexual activity: Not on file  Other Topics Concern   Not on file  Social History Narrative   Not on file   Social Drivers of Health   Tobacco Use: Medium Risk (05/30/2024)   Patient History    Smoking Tobacco Use: Former    Smokeless Tobacco Use: Never    Passive Exposure: Not on file  Financial Resource Strain: Low Risk (11/18/2023)   Overall Financial Resource Strain (CARDIA)     Difficulty of Paying Living Expenses: Not hard at all  Food Insecurity: Unknown (11/18/2023)   Epic    Worried About Programme Researcher, Broadcasting/film/video in the Last Year: Never true    Ran Out of Food in the Last Year: Patient declined  Transportation Needs: Patient Declined (11/18/2023)   Epic    Lack of Transportation (Medical): Patient declined    Lack of Transportation (Non-Medical): Patient declined  Physical Activity: Insufficiently Active (11/18/2023)   Exercise Vital Sign    Days of Exercise per Week: 3 days    Minutes of Exercise per Session: 20 min  Stress: No Stress Concern Present (11/18/2023)   [employer] of Occupational Health - Occupational Stress Questionnaire    Feeling of Stress: Only a little  Social Connections: Unknown (11/18/2023)   Social Connection and Isolation Panel    Frequency of Communication with Friends and Family: Patient declined    Frequency of Social Gatherings with Friends and Family: Patient declined    Attends Religious Services: Patient declined    Database Administrator or Organizations: Patient declined    Attends Banker Meetings: Not on file    Marital Status: Married  Intimate Partner Violence: Not At Risk (12/22/2022)   Humiliation, Afraid, Rape, and Kick questionnaire    Fear of Current or Ex-Partner: No    Emotionally Abused: No    Physically Abused: No    Sexually Abused: No  Depression (PHQ2-9): Low Risk (12/22/2022)   Depression (PHQ2-9)    PHQ-2 Score: 0  Alcohol Screen: Low Risk (11/18/2023)   Alcohol Screen    Last Alcohol Screening Score (AUDIT): 4  Housing: Unknown (03/28/2024)   Received from [facility]   Epic    Unable to Pay for Housing in the Last Year: Not on file    Number of Times Moved in the Last Year: Not on file    At any time in the past 12 months, were you homeless or living in a shelter (including now)?: No  Utilities: Not At Risk (12/18/2022)   [facility] Utilities    Threatened with loss of  utilities: No  Health Literacy: Adequate Health Literacy (12/22/2022)   B1300 Health Literacy    Frequency of need for help with medical instructions: Never    Review of Systems: See HPI, otherwise negative ROS  Physical Exam: BP (!) 148/70   Pulse 66   Temp (!) 97.1 F (36.2 C)   Ht 6' 1 (1.854 m)   Wt 97.1 kg   SpO2 96%   BMI 28.23 kg/m  General:   Alert, cooperative. Head:  Normocephalic and atraumatic. Respiratory:  Normal work of breathing. Cardiovascular:  NAD  Impression/Plan: [name] is  here for cataract surgery.  Risks, benefits, limitations, and alternatives regarding cataract surgery have been reviewed with the patient.  Questions have been answered.  All parties agreeable.   Elsie Carmine, MD  05/30/2024, 8:32 AM

## 2024-05-30 NOTE — Anesthesia Preprocedure Evaluation (Addendum)
"                                    Anesthesia Evaluation  Patient identified by MRN, date of birth, ID band Patient awake    Reviewed: Allergy & Precautions, H&P , NPO status , Patient's Chart, lab work & pertinent test results, reviewed documented beta blocker date and time   Airway Mallampati: II  TM Distance: >3 FB Neck ROM: full    Dental no notable dental hx. (+) Teeth Intact   Pulmonary neg pulmonary ROS, former smoker   Pulmonary exam normal breath sounds clear to auscultation       Cardiovascular Exercise Tolerance: Good negative cardio ROS Normal cardiovascular exam Rhythm:regular Rate:Normal     Neuro/Psych negative neurological ROS  negative psych ROS   GI/Hepatic negative GI ROS, Neg liver ROS,,,  Endo/Other  negative endocrine ROS    Renal/GU negative Renal ROS  negative genitourinary   Musculoskeletal negative musculoskeletal ROS (+)    Abdominal   Peds negative pediatric ROS (+)  Hematology negative hematology ROS (+)   Anesthesia Other Findings   Reproductive/Obstetrics negative OB ROS                              Anesthesia Physical Anesthesia Plan  ASA: 2  Anesthesia Plan: MAC   Post-op Pain Management:    Induction: Intravenous  PONV Risk Score and Plan:   Airway Management Planned:   Additional Equipment:   Intra-op Plan:   Post-operative Plan: Extubation in OR  Informed Consent: I have reviewed the patients History and Physical, chart, labs and discussed the procedure including the risks, benefits and alternatives for the proposed anesthesia with the patient or authorized representative who has indicated his/her understanding and acceptance.     Dental Advisory Given  Plan Discussed with: CRNA  Anesthesia Plan Comments:         Anesthesia Quick Evaluation  "

## 2024-05-30 NOTE — Op Note (Signed)
 PREOPERATIVE DIAGNOSIS:  Nuclear sclerotic cataract of the right eye.   POSTOPERATIVE DIAGNOSIS:  Nuclear sclerotic cataract of the right eye.   OPERATIVE PROCEDURE: Procedures: PHACOEMULSIFICATION, CATARACT, WITH IOL INSERTION 18.33 01:22.7   SURGEON:  Elsie Carmine, MD.   ANESTHESIA: 1.      Managed anesthesia care. 2.     0.60ml of Shugarcaine was instilled following the paracentesis  Anesthesiologist: Maggioncalda, Fairy LABOR, MD CRNA: Jahoo, Sonia, CRNA  COMPLICATIONS:  None.   TECHNIQUE:   Stop and chop    DESCRIPTION OF PROCEDURE:  The patient was examined and consented in the preoperative holding area where the aforementioned topical anesthesia was applied to the right eye.  The patient was brought back to the Operating Room where he was sat upright on the gurney and given a target to fixate upon while the eye was marked at the 3:00 and 9:00 position.  The patient was then reclined on the operating table.  The eye was prepped and draped in the usual sterile ophthalmic fashion and a lid speculum was placed. A paracentesis was created with the side port blade and the anterior chamber was filled with viscoelastic. A near clear corneal incision was performed with the steel keratome. A continuous curvilinear capsulorrhexis was performed with a cystotome followed by the capsulorrhexis forceps. Hydrodissection and hydrodelineation were carried out with BSS on a blunt cannula. The lens was removed in a stop and chop technique and the remaining cortical material was removed with the irrigation-aspiration handpiece. The eye was inflated with viscoelastic and the intraocular  lens  was placed in the eye and rotated to within a few degrees of the predetermined orientation.  The remaining viscoelastic was removed from the eye.  The Sinskey hook was used to rotate the toric lens into its final resting place at 007 degrees.  0. The eye was inflated to a physiologic pressure and found to be watertight.  0.52ml of Vigamox  was placed in the anterior chamber.  The eye was dressed with Combigan . The patient was given protective glasses to wear throughout the day and a shield with which to sleep tonight. The patient was also given drops with which to begin a drop regimen today and will follow-up with me in one day. Implant Name Type Inv. Item Serial No. Manufacturer Lot No. LRB No. Used Action  LENS IOL CLRN VT TRC 3 20.5 - [id number]  LENS IOL CLRN VT TRC 3 20.5 [id number] [employer]  Right 1 Implanted   Procedures: PHACOEMULSIFICATION, CATARACT, WITH IOL INSERTION 18.33 01:22.7 (Right)  Electronically signed: Elsie Carmine 05/30/2024 9:18 AM

## 2024-05-30 NOTE — Transfer of Care (Signed)
 Immediate Anesthesia Transfer of Care Note  Patient: [name]  Procedure(s) Performed: PHACOEMULSIFICATION, CATARACT, WITH IOL INSERTION 18.33 01:22.7 (Right: Eye)  Patient Location: PACU  Anesthesia Type: MAC  Level of Consciousness: awake, alert  and patient cooperative  Airway and Oxygen Therapy: Patient Spontanous Breathing and Patient connected to supplemental oxygen  Post-op Assessment: Post-op Vital signs reviewed, Patient's Cardiovascular Status Stable, Respiratory Function Stable, Patent Airway and No signs of Nausea or vomiting  Post-op Vital Signs: Reviewed and stable  Complications: No notable events documented.

## 2024-05-30 NOTE — Anesthesia Postprocedure Evaluation (Signed)
"   Anesthesia Post Note  Patient: HOYT LEANOS  Procedure(s) Performed: PHACOEMULSIFICATION, CATARACT, WITH IOL INSERTION 18.33 01:22.7 (Right: Eye)  Patient location during evaluation: PACU Anesthesia Type: MAC Level of consciousness: awake and alert Pain management: pain level controlled Vital Signs Assessment: post-procedure vital signs reviewed and stable Respiratory status: spontaneous breathing, nonlabored ventilation, respiratory function stable and patient connected to nasal cannula oxygen Cardiovascular status: blood pressure returned to baseline and stable Postop Assessment: no apparent nausea or vomiting Anesthetic complications: no   No notable events documented.   Last Vitals:  Vitals:   05/30/24 0917 05/30/24 0922  BP: 122/62 126/61  Pulse: (!) 48 (!) 57  Resp: 14 20  Temp: (!) 36.3 C (!) 36.3 C  SpO2: 96% 97%    Last Pain:  Vitals:   05/30/24 0922  PainSc: 0-No pain                 Fairy A Karmyn Lowman      "

## 2024-06-05 NOTE — Progress Notes (Signed)
 "                                                                      MRN : [id number]  [name] is a 79 y.o. ([birth year]) male who presents with chief complaint of check circulation.  History of Present Illness:  Patient returns to the office for follow-up regarding his atherosclerotic occlusive disease.  He is status post bypass and subsequent salvage intervention.   Procedure 07/14/2022:  Percutaneous transluminal angioplasty and stent placement left saphenous vein bypass.    Procedure 1./07/2022: left common femoral artery to left posterior tibial artery bypass with in situ great saphenous vein. Vein patch angioplasty of the proximal portion of the vein bypass.   There have been no interval changes in lower extremity symptoms. No interval shortening of the patient's claudication distance or development of rest pain symptoms. No new ulcers or wounds have occurred since the last visit.  His left ankle remains healed but is still tender to palpation.  There have been no significant changes to the patient's overall health care.   The patient denies amaurosis fugax or recent TIA symptoms. There are no documented recent neurological changes noted. There is no history of DVT, PE or superficial thrombophlebitis. The patient denies recent episodes of angina or shortness of breath.    ABI Rt=1.13 and Lt=1.00  (previous ABI's Rt=1.15 and Lt=1.19 )   Previous duplex ultrasound of the arterial duplexes done today show primarily biphasic and triphasic waveforms throughout the left lower extremity with a widely patent bypass graft and stent placement.     Active Medications[1]  Past Medical History:  Diagnosis Date   Abnormal prostate specific antigen 01/23/2015   Actinic keratosis    Acute bacterial sinusitis 01/23/2015   Basal cell carcinoma 03/18/2020   right cheek, excised 06/04/20   Cannot sleep 01/23/2015   Disease caused by virus 01/23/2015   Diverticulitis 01/23/2015    Diverticulosis    History of tobacco use 07/26/2009   PNA (pneumonia) 01/23/2015   Prostate cancer (HCC)    observing   Severe obstructive sleep apnea 01/23/2015    Past Surgical History:  Procedure Laterality Date   APPENDECTOMY     BACK SURGERY     CATARACT EXTRACTION Left    CATARACT EXTRACTION W/PHACO Right 05/30/2024   Procedure: PHACOEMULSIFICATION, CATARACT, WITH IOL INSERTION 18.33 01:22.7;  Surgeon: Jaye Fallow, MD;  Location: [facility];  Service: Ophthalmology;  Laterality: Right;   COLONOSCOPY     FEMORAL-TIBIAL BYPASS GRAFT Left 05/13/2022   Procedure: BYPASS GRAFT FEMORAL-TIBIAL ARTERY (POST TIBIAL);  Surgeon: Jama Cordella MATSU, MD;  Location: [facility];  Service: Vascular;  Laterality: Left;   LEG SURGERY Right    meniscus repair   LOWER EXTREMITY ANGIOGRAPHY Left 03/24/2022   Procedure: Lower Extremity Angiography;  Surgeon: Jama Cordella MATSU, MD;  Location: [facility];  Service: Cardiovascular;  Laterality: Left;   LOWER EXTREMITY ANGIOGRAPHY Left 07/14/2022   Procedure: Lower Extremity Angiography;  Surgeon: Jama Cordella MATSU, MD;  Location: [facility];  Service: Cardiovascular;  Laterality: Left;    Social History Social History[2]  Family History Family History  Problem Relation Age of Onset   Prostate cancer Father  Heart disease Father        MI in his 48s, but died at age 71    Allergies[3]   REVIEW OF SYSTEMS (Negative unless checked)  Constitutional: [] Weight loss  [] Fever  [] Chills Cardiac: [] Chest pain   [] Chest pressure   [] Palpitations   [] Shortness of breath when laying flat   [] Shortness of breath with exertion. Vascular:  [x] Pain in legs with walking   [] Pain in legs at rest  [] History of DVT   [] Phlebitis   [] Swelling in legs   [] Varicose veins   [] Non-healing ulcers Pulmonary:   [] Uses home oxygen   [] Productive cough   [] Hemoptysis   [] Wheeze  [] COPD   [] Asthma Neurologic:  [] Dizziness   [] Seizures    [] History of stroke   [] History of TIA  [] Aphasia   [] Vissual changes   [] Weakness or numbness in arm   [] Weakness or numbness in leg Musculoskeletal:   [] Joint swelling   [] Joint pain   [] Low back pain Hematologic:  [] Easy bruising  [] Easy bleeding   [] Hypercoagulable state   [] Anemic Gastrointestinal:  [] Diarrhea   [] Vomiting  [] Gastroesophageal reflux/heartburn   [] Difficulty swallowing. Genitourinary:  [] Chronic kidney disease   [] Difficult urination  [] Frequent urination   [] Blood in urine Skin:  [] Rashes   [] Ulcers  Psychological:  [] History of anxiety   []  History of major depression.  Physical Examination  There were no vitals filed for this visit. There is no height or weight on file to calculate BMI. Gen: WD/WN, NAD Head: [place]/AT, No temporalis wasting.  Ear/Nose/Throat: Hearing grossly intact, nares w/o erythema or drainage Eyes: PER, EOMI, sclera nonicteric.  Neck: Supple, no masses.  No bruit or JVD.  Pulmonary:  Good air movement, no audible wheezing, no use of accessory muscles.  Cardiac: RRR, normal S1, S2, no Murmurs. Vascular:  mild trophic changes, no open wounds Vessel Right Left  Radial Palpable Palpable  PT Not Palpable Not Palpable  DP Not Palpable Not Palpable  Gastrointestinal: soft, non-distended. No guarding/no peritoneal signs.  Musculoskeletal: M/S 5/5 throughout.  No visible deformity.  Neurologic: CN 2-12 intact. Pain and light touch intact in extremities.  Symmetrical.  Speech is fluent. Motor exam as listed above. Psychiatric: Judgment intact, Mood & affect appropriate for pt's clinical situation. Dermatologic: No rashes or ulcers noted.  No changes consistent with cellulitis.   CBC Lab Results  Component Value Date   WBC 11.3 (H) 03/24/2023   HGB 15.6 03/24/2023   HCT 46.5 03/24/2023   MCV 96.1 03/24/2023   PLT 194 03/24/2023    BMET    Component Value Date/Time   NA 138 03/24/2023 1926   K 3.7 03/24/2023 1926   CL 105 03/24/2023 1926    CO2 23 03/24/2023 1926   GLUCOSE 109 (H) 03/24/2023 1926   BUN 21 03/24/2023 1926   CREATININE 0.82 03/24/2023 1926   CALCIUM  9.1 03/24/2023 1926   GFRNONAA >60 03/24/2023 1926   GFRAA >60 10/09/2017 1931   CrCl cannot be calculated (Patient's most recent lab result is older than the maximum 21 days allowed.).  COAG No results found for: INR, PROTIME  Radiology No results found.   Assessment/Plan 1. Atherosclerosis of native artery of both lower extremities with intermittent claudication (Primary) Recommend:   The patient has evidence of atherosclerosis of the lower extremities with claudication.  He is status post femoral distal bypass January 2024.  Bypass remains patent with ABI.   The patient does not voice lifestyle limiting changes at this point  in time.   Noninvasive studies do not suggest clinically significant change.   No invasive studies, angiography or surgery at this time The patient should continue walking and begin a more formal exercise program.  The patient should continue antiplatelet therapy and aggressive treatment of the lipid abnormalities   No changes in the patient's medications at this time   Continued surveillance is indicated as atherosclerosis is likely to progress with time.     The patient will continue follow up with noninvasive studies as ordered.  - VAS US  ABI WITH/WO TBI; Future - VAS US  LOWER EXTREMITY ARTERIAL DUPLEX; Future  2. Chronic obstructive pulmonary disease, unspecified COPD type (HCC) Continue pulmonary medications and aerosols as already ordered, these medications have been reviewed and there are no changes at this time.   3. Lumbar radiculopathy Continue medications to treat the patient's degenerative disease as already ordered, these medications have been reviewed and there are no changes at this time.  Continued activity and therapy was stressed.  4. Hyperlipidemia, mixed Continue statin as ordered and reviewed, no  changes at this time    Cordella Shawl, MD  06/05/2024 3:39 PM      [1]  No outpatient medications have been marked as taking for the 06/08/24 encounter (Appointment) with Shawl, Cordella MATSU, MD.  [2]  Social History Tobacco Use   Smoking status: Former    Current packs/day: 0.75    Average packs/day: 0.8 packs/day for 56.0 years (42.0 ttl pk-yrs)    Types: Cigarettes   Smokeless tobacco: Never   Tobacco comments:    Started smoking age 87, usually about 3/4 ppd Quit in 2022  Vaping Use   Vaping status: Never Used  Substance Use Topics   Alcohol use: Yes    Alcohol/week: 14.0 standard drinks of alcohol    Types: 14 Glasses of wine per week   Drug use: No  [3]  Allergies Allergen Reactions   Penicillins Hives and Anaphylaxis   "

## 2024-06-08 ENCOUNTER — Ambulatory Visit (INDEPENDENT_AMBULATORY_CARE_PROVIDER_SITE_OTHER): Admitting: Vascular Surgery

## 2024-06-08 ENCOUNTER — Other Ambulatory Visit (INDEPENDENT_AMBULATORY_CARE_PROVIDER_SITE_OTHER)

## 2024-06-08 ENCOUNTER — Encounter (INDEPENDENT_AMBULATORY_CARE_PROVIDER_SITE_OTHER): Payer: Self-pay | Admitting: Vascular Surgery

## 2024-06-08 VITALS — BP 143/82 | HR 62 | Resp 15 | Ht 73.0 in | Wt 218.0 lb

## 2024-06-08 DIAGNOSIS — M5416 Radiculopathy, lumbar region: Secondary | ICD-10-CM | POA: Diagnosis not present

## 2024-06-08 DIAGNOSIS — J449 Chronic obstructive pulmonary disease, unspecified: Secondary | ICD-10-CM

## 2024-06-08 DIAGNOSIS — I70213 Atherosclerosis of native arteries of extremities with intermittent claudication, bilateral legs: Secondary | ICD-10-CM | POA: Diagnosis not present

## 2024-06-08 DIAGNOSIS — E782 Mixed hyperlipidemia: Secondary | ICD-10-CM

## 2024-06-08 LAB — VAS US ABI WITH/WO TBI
Left ABI: 1
Right ABI: 1.13

## 2024-07-19 ENCOUNTER — Encounter: Admitting: Dermatology

## 2024-12-04 ENCOUNTER — Ambulatory Visit (INDEPENDENT_AMBULATORY_CARE_PROVIDER_SITE_OTHER): Admitting: Vascular Surgery

## 2024-12-04 ENCOUNTER — Encounter (INDEPENDENT_AMBULATORY_CARE_PROVIDER_SITE_OTHER)
